# Patient Record
Sex: Female | Born: 1985 | Race: Black or African American | Hispanic: No | Marital: Married | Smoking: Never smoker
Health system: Southern US, Community
[De-identification: ages and names within clinical notes are randomized; demographics above are authoritative.]

## PROBLEM LIST (undated history)

## (undated) ENCOUNTER — Inpatient Hospital Stay (HOSPITAL_COMMUNITY): Payer: Self-pay

## (undated) DIAGNOSIS — R109 Unspecified abdominal pain: Secondary | ICD-10-CM

## (undated) DIAGNOSIS — O26899 Other specified pregnancy related conditions, unspecified trimester: Secondary | ICD-10-CM

## (undated) DIAGNOSIS — K589 Irritable bowel syndrome without diarrhea: Secondary | ICD-10-CM

## (undated) DIAGNOSIS — G248 Other dystonia: Secondary | ICD-10-CM

## (undated) DIAGNOSIS — R87629 Unspecified abnormal cytological findings in specimens from vagina: Secondary | ICD-10-CM

## (undated) DIAGNOSIS — M549 Dorsalgia, unspecified: Secondary | ICD-10-CM

## (undated) DIAGNOSIS — Z8619 Personal history of other infectious and parasitic diseases: Secondary | ICD-10-CM

## (undated) DIAGNOSIS — R51 Headache: Secondary | ICD-10-CM

## (undated) DIAGNOSIS — F419 Anxiety disorder, unspecified: Secondary | ICD-10-CM

## (undated) DIAGNOSIS — G249 Dystonia, unspecified: Secondary | ICD-10-CM

## (undated) DIAGNOSIS — K219 Gastro-esophageal reflux disease without esophagitis: Secondary | ICD-10-CM

## (undated) DIAGNOSIS — O24419 Gestational diabetes mellitus in pregnancy, unspecified control: Secondary | ICD-10-CM

## (undated) DIAGNOSIS — O26893 Other specified pregnancy related conditions, third trimester: Secondary | ICD-10-CM

## (undated) DIAGNOSIS — D649 Anemia, unspecified: Secondary | ICD-10-CM

## (undated) DIAGNOSIS — E669 Obesity, unspecified: Secondary | ICD-10-CM

## (undated) DIAGNOSIS — K602 Anal fissure, unspecified: Secondary | ICD-10-CM

## (undated) DIAGNOSIS — R519 Headache, unspecified: Secondary | ICD-10-CM

## (undated) DIAGNOSIS — N6452 Nipple discharge: Secondary | ICD-10-CM

## (undated) DIAGNOSIS — G8929 Other chronic pain: Secondary | ICD-10-CM

## (undated) HISTORY — DX: Anxiety disorder, unspecified: F41.9

## (undated) HISTORY — DX: Nipple discharge: N64.52

## (undated) HISTORY — PX: WISDOM TOOTH EXTRACTION: SHX21

## (undated) HISTORY — DX: Dystonia, unspecified: G24.9

## (undated) HISTORY — PX: TONSILLECTOMY: SUR1361

## (undated) HISTORY — DX: Gestational diabetes mellitus in pregnancy, unspecified control: O24.419

## (undated) HISTORY — PX: CHOLECYSTECTOMY, LAPAROSCOPIC: SHX56

## (undated) HISTORY — DX: Obesity, unspecified: E66.9

## (undated) HISTORY — DX: Other chronic pain: G89.29

## (undated) HISTORY — PX: CHOLECYSTECTOMY: SHX55

## (undated) HISTORY — DX: Personal history of other infectious and parasitic diseases: Z86.19

## (undated) HISTORY — DX: Anal fissure, unspecified: K60.2

## (undated) HISTORY — DX: Headache: R51

## (undated) HISTORY — DX: Dorsalgia, unspecified: M54.9

## (undated) HISTORY — DX: Other dystonia: G24.8

## (undated) HISTORY — DX: Anemia, unspecified: D64.9

## (undated) HISTORY — DX: Headache, unspecified: R51.9

## (undated) HISTORY — DX: Unspecified abnormal cytological findings in specimens from vagina: R87.629

## (undated) HISTORY — DX: Irritable bowel syndrome, unspecified: K58.9

---
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
## 2007-09-23 ENCOUNTER — Encounter: Admission: RE | Admit: 2007-09-23 | Discharge: 2007-09-23 | Payer: Self-pay | Admitting: Family Medicine

## 2009-03-17 ENCOUNTER — Emergency Department (HOSPITAL_COMMUNITY): Admission: EM | Admit: 2009-03-17 | Discharge: 2009-03-17 | Payer: Self-pay | Admitting: Emergency Medicine

## 2009-04-02 ENCOUNTER — Emergency Department (HOSPITAL_COMMUNITY): Admission: EM | Admit: 2009-04-02 | Discharge: 2009-04-02 | Payer: Self-pay | Admitting: Emergency Medicine

## 2009-04-11 ENCOUNTER — Ambulatory Visit (HOSPITAL_COMMUNITY): Admission: RE | Admit: 2009-04-11 | Discharge: 2009-04-11 | Payer: Self-pay | Admitting: Sports Medicine

## 2009-04-25 ENCOUNTER — Encounter (HOSPITAL_COMMUNITY): Admission: RE | Admit: 2009-04-25 | Discharge: 2009-05-25 | Payer: Self-pay | Admitting: Sports Medicine

## 2009-05-30 ENCOUNTER — Encounter (HOSPITAL_COMMUNITY): Admission: RE | Admit: 2009-05-30 | Discharge: 2009-06-13 | Payer: Self-pay | Admitting: Sports Medicine

## 2009-06-17 ENCOUNTER — Encounter (HOSPITAL_COMMUNITY): Admission: RE | Admit: 2009-06-17 | Discharge: 2009-07-17 | Payer: Self-pay | Admitting: Sports Medicine

## 2009-09-10 ENCOUNTER — Ambulatory Visit: Payer: Self-pay | Admitting: Psychology

## 2010-01-03 ENCOUNTER — Ambulatory Visit (HOSPITAL_COMMUNITY): Admission: RE | Admit: 2010-01-03 | Discharge: 2010-01-03 | Payer: Self-pay | Admitting: Family Medicine

## 2010-08-13 ENCOUNTER — Emergency Department (HOSPITAL_COMMUNITY)
Admission: EM | Admit: 2010-08-13 | Discharge: 2010-08-13 | Disposition: A | Discharge status: Home / Self Care | Payer: Managed Care, Other (non HMO) | Attending: Emergency Medicine | Admitting: Emergency Medicine

## 2010-08-13 DIAGNOSIS — K602 Anal fissure, unspecified: Secondary | ICD-10-CM | POA: Insufficient documentation

## 2010-08-13 DIAGNOSIS — K625 Hemorrhage of anus and rectum: Secondary | ICD-10-CM | POA: Insufficient documentation

## 2010-08-13 LAB — URINALYSIS, ROUTINE W REFLEX MICROSCOPIC
Ketones, ur: NEGATIVE mg/dL
Nitrite: NEGATIVE
Protein, ur: NEGATIVE mg/dL
Urine Glucose, Fasting: NEGATIVE mg/dL
pH: 5.5 (ref 5.0–8.0)

## 2010-08-13 LAB — POCT I-STAT, CHEM 8
Calcium, Ion: 1.21 mmol/L (ref 1.12–1.32)
Chloride: 104 mEq/L (ref 96–112)
Glucose, Bld: 94 mg/dL (ref 70–99)
Hemoglobin: 12.9 g/dL (ref 12.0–15.0)
Sodium: 140 mEq/L (ref 135–145)
TCO2: 24 mmol/L (ref 0–100)

## 2010-08-14 ENCOUNTER — Telehealth: Payer: Self-pay | Admitting: Gastroenterology

## 2010-08-15 ENCOUNTER — Encounter: Payer: Self-pay | Admitting: Nurse Practitioner

## 2010-08-15 ENCOUNTER — Ambulatory Visit (INDEPENDENT_AMBULATORY_CARE_PROVIDER_SITE_OTHER): Payer: Managed Care, Other (non HMO) | Admitting: Nurse Practitioner

## 2010-08-15 DIAGNOSIS — R143 Flatulence: Secondary | ICD-10-CM

## 2010-08-15 DIAGNOSIS — R1084 Generalized abdominal pain: Secondary | ICD-10-CM | POA: Insufficient documentation

## 2010-08-15 DIAGNOSIS — K625 Hemorrhage of anus and rectum: Secondary | ICD-10-CM | POA: Insufficient documentation

## 2010-08-15 DIAGNOSIS — K648 Other hemorrhoids: Secondary | ICD-10-CM | POA: Insufficient documentation

## 2010-08-15 DIAGNOSIS — R141 Gas pain: Secondary | ICD-10-CM | POA: Insufficient documentation

## 2010-08-15 DIAGNOSIS — R142 Eructation: Secondary | ICD-10-CM

## 2010-08-21 NOTE — Progress Notes (Signed)
Summary: Triage  Phone Note Call from Patient Call back at Home Phone [phone]   Caller: Patient Call For: Dr Russella Dar Reason for Call: Acute Illness, Talk to Nurse Details for Reason: Triage Summary of Call: Pt went to [facility] ER yesterday c/o of abd cramping, nausea (without vomiting) and blood in stool. They referred her to Dr Russella Dar. Pt does not feel she can wait until April to be seen. Initial call taken by: Dwan Bolt,  August 14, 2010 12:09 PM  Follow-up for Phone Call        patient is scheduled to see Willette Cluster RNP on 08/15/10 2:30 Follow-up by: Darcey Nora RN, CGRN,  August 14, 2010 4:21 PM
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
## 2010-09-26 ENCOUNTER — Ambulatory Visit: Payer: Managed Care, Other (non HMO) | Admitting: Internal Medicine

## 2010-10-31 NOTE — H&P (Signed)
[facility]  Patient:    Yvette Conley, Yvette Conley Visit Number: [id number] MRN: [id number]          Service Type: MED Location: 3A A315 01 Attending Physician:  Rosalyn Charters Dictated by:   Vivia Ewing, D.O. Admit Date:  09/18/2001   CC:         Donna Bernard, M.D.   History and Physical  CHIEF COMPLAINT:  Leg pain.  HISTORY OF PRESENT ILLNESS:  The patient is a 25 year old female who presents with approximately a five-day history of progressive and persistent ankle pain.  She has had no apparent swelling but has complained significantly of discomfort, especially on ambulation.  She was seen by Dr. Lacretia Nicks. Simone Curia previously in the week and had some blood drawn and placed on prednisone.  She has had no improvement and arrived to the emergency room on the evening of admission.  I was consulted on two occasions by telephone to assist in managing this patient.  We attempted to increase her analgesics to narcotics and she has had no improvement and requires admission to the hospital for pain management.  PAST MEDICAL HISTORY:  Noncontributory.  SOCIAL HISTORY:  Noncontributory.  FAMILY HISTORY:  Negative for sickle cell trait or disease.  MEDICATIONS:  Prednisone, as prescribed by Dr. Gerda Diss.  The patient has received Demerol and Phenergan in the emergency room without improvement.  ALLERGIES:  No known drug allergies.  REVIEW OF SYSTEMS:  There have been no other systemic symptoms of fever or other joint pains other than on the evening that she was in the emergency room.  She now complains of pains all over.  There is no nausea or vomiting, URI or cough.  PHYSICAL EXAMINATION:  VITAL SIGNS:  Vital signs are all stable.  The patient is afebrile.  EXTREMITIES:  The leg shows no edema or joint effusion noted.  There is tenderness to palpation over both the joint and soft tissue areas of the leg.  Otherwise, physical exam is  unremarkable.  LABORATORY AND ACCESSORY DATA:  X-rays are unremarkable for any bony abnormality or obvious soft tissue abnormality.  IMPRESSION AND PLAN:  25-year-old female with arthralgias/myalgias. Plan will be to admit for analgesia.  I have requested that blood be obtained for an ANA, rheumatoid factor as well as ASO titer.  We will try to maintain her both on nonsteroidals, continue prednisone as well as parenteral narcotic pain medications as needed. Dictated by:   Vivia Ewing, D.O. Attending Physician:  Rosalyn Charters DD:  09/19/01 TD:  09/19/01 Job: [id number]

## 2010-10-31 NOTE — Discharge Summary (Signed)
[facility]  Patient:    Conley, Yvette Visit Number: [id number] MRN: [id number]          Service Type: MED Location: 3A A315 01 Attending Physician:  Harlow Asa Dictated by:   Donna Bernard, M.D. Admit Date:  09/18/2001 Discharge Date: 09/20/2001                             Discharge Summary  FINAL DIAGNOSES: 1. Leg pain. 2. Shoulder pain. 3. Hyperventilation.  FINAL DISPOSITION:  Patient discharged to home.  DISCHARGE MEDICATIONS: 1. Prednisone taper as directed. 2. Vicodin one q.4-6h. p.r.n. for pain. 3. Xanax 0.5 mg one p.r.n. for nerves/hyperventilation.  ACTIVITY:  Patient to utilize bed rest and increase activity slowly and crutches if necessary.  FOLLOWUP:  Will set up with a rheumatologist at [facility] to follow up patient as an outpatient.  INITIAL HISTORY AND PHYSICAL:  Please see H&P as dictated.  HOSPITAL COURSE:  This patient is a 25 year old black female who presents to hospital date of admission with complaints of progressive pain in the leg. She was in significant distress and tearful with her discomfort.  She had been seen earlier in the week by Dr. Lilyan Punt and given some prednisone and some blood work was ordered.  Dr. Milford Cage worked with the mother multiple times over the phone and adjusted pain medications with no apparent improvement in the patients symptoms.  She was admitted to the hospital.  Blood work was done.  White blood count was normal.  Hemoglobin normal.  Sedimentation rate 27, virtually normal.  Sickle cell screen negative.  MET-7 negative.  Uric acid normal 4.7.  ANA, rheumatoid factor were sent by Dr. Milford Cage.  ASO test was done.  This was negative.  Rheumatoid factor returned negative.  X-ray of the left leg was done.  This was negative.  Chest x-ray done showed no active disease.  EKG was done which showed normal sinus rhythm, no significant ST-T changes.  The patient was given IV pain  medicine by Dr. Milford Cage for control of pain.  She was also started on prednisone 25 mg t.i.d.  Over the next 36 hours the patient noted some improvement in her level of pain.  There was a spell of hyperventilation and perceived shortness of breath which led the patient and the family to become quite anxious and upset.  A blood gas performed during her period of shortness of breath revealed signs felt to be consistent with hyperventilation.  Based on this the patient was given some Xanax to help with her symptomatology.  Of note, an EKG and chest x-ray were performed which were normal.  The day of discharge the patient was sleeping comfortably in the room when I came in to wake her up.  She became tearful when I started to talk to her about her pain.  I indicated to the mother that all the childs tests were virtually normal and that at this time I saw no reason to maintain the patient in the hospital.  I advised the family that I thought she would do better in the comfort of her own home with appropriate pain medicine and follow-up visit with a rheumatology specialist at the [facility].  The family expressed agreement with this.  I went over to the office.  I asked our nurse to immediately start working on a referral.  About an hour later the hospital nurse called back  and stated the family was upset with having to leave.  I advised the nurse at the hospital that I really thought the patients tests were normal and her examination was stable and I saw no reason for continued admission to the hospital.  I felt the patient needed to see a rheumatologist which I felt could be safely done on an outpatient basis.  I spoke with the rheumatologist at [facility] and presented the entire case to the rheumatologist.  The rheumatologist agreed that this could be handled in an urgent fashion, but through an outpatient setting.  She was set up to see an outpatient rheumatologist on Thursday.   Patient was diagnosed home on discharged and disposition as noted above. Dictated by:   Donna Bernard, M.D. Attending Physician:  Harlow Asa DD:  10/04/01 TD:  10/05/01 Job: [id number]

## 2011-02-02 ENCOUNTER — Ambulatory Visit: Payer: Managed Care, Other (non HMO) | Admitting: Family Medicine

## 2011-02-06 ENCOUNTER — Ambulatory Visit (INDEPENDENT_AMBULATORY_CARE_PROVIDER_SITE_OTHER): Payer: Managed Care, Other (non HMO) | Admitting: Family Medicine

## 2011-02-06 ENCOUNTER — Encounter: Payer: Self-pay | Admitting: Family Medicine

## 2011-02-06 VITALS — BP 110/80 | HR 100 | Ht 67.0 in | Wt 243.0 lb

## 2011-02-06 DIAGNOSIS — R259 Unspecified abnormal involuntary movements: Secondary | ICD-10-CM

## 2011-02-06 DIAGNOSIS — G249 Dystonia, unspecified: Secondary | ICD-10-CM

## 2011-02-06 DIAGNOSIS — E669 Obesity, unspecified: Secondary | ICD-10-CM

## 2011-02-06 DIAGNOSIS — G248 Other dystonia: Secondary | ICD-10-CM

## 2011-02-06 MED ORDER — BACLOFEN 10 MG PO TABS: 10.0000 mg | ORAL_TABLET | Freq: Three times a day (TID) | ORAL | Status: AC | PRN

## 2011-02-06 NOTE — Patient Instructions (Addendum)
I will get records from Dr. Adaline Sill office Flu shot given today  Use the baclofen if you have another attack Continue other medications Check into [employer] about swimming/pool times , watch the carbs, and fried foods , increase veggies and water intake Schedule a PAP Smear visit

## 2011-02-08 DIAGNOSIS — E669 Obesity, unspecified: Secondary | ICD-10-CM | POA: Insufficient documentation

## 2011-02-08 DIAGNOSIS — G249 Dystonia, unspecified: Secondary | ICD-10-CM | POA: Insufficient documentation

## 2011-02-08 DIAGNOSIS — G248 Other dystonia: Secondary | ICD-10-CM | POA: Insufficient documentation

## 2011-02-08 NOTE — Assessment & Plan Note (Signed)
I reviewed dystonia, medications used include anti-parkinson meds, anti-spasmodic, anxiolytics and a few others which pt has tried. At this time we will continue her prn Ativan, given a script for Baclofen to try. Will obtain info from her PCP

## 2011-02-08 NOTE — Assessment & Plan Note (Signed)
Pt at high risk for DM, HTN, heart disease based on weight and family history. This was expressed, we discussed needing to start an exercise routine, change diet.  Will review PCP labs She needs FLP done
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
## 2011-03-06 ENCOUNTER — Other Ambulatory Visit (HOSPITAL_COMMUNITY)
Admission: RE | Admit: 2011-03-06 | Discharge: 2011-03-06 | Disposition: A | Discharge status: Home / Self Care | Payer: Managed Care, Other (non HMO) | Source: Ambulatory Visit | Attending: Family Medicine | Admitting: Family Medicine

## 2011-03-06 ENCOUNTER — Encounter: Payer: Self-pay | Admitting: Family Medicine

## 2011-03-06 ENCOUNTER — Ambulatory Visit (INDEPENDENT_AMBULATORY_CARE_PROVIDER_SITE_OTHER): Payer: Managed Care, Other (non HMO) | Admitting: Family Medicine

## 2011-03-06 VITALS — BP 120/78 | HR 89 | Resp 16 | Ht 67.0 in | Wt 237.1 lb

## 2011-03-06 DIAGNOSIS — N760 Acute vaginitis: Secondary | ICD-10-CM

## 2011-03-06 DIAGNOSIS — G248 Other dystonia: Secondary | ICD-10-CM

## 2011-03-06 DIAGNOSIS — Z01419 Encounter for gynecological examination (general) (routine) without abnormal findings: Secondary | ICD-10-CM | POA: Insufficient documentation

## 2011-03-06 DIAGNOSIS — Z0000 Encounter for general adult medical examination without abnormal findings: Secondary | ICD-10-CM

## 2011-03-06 DIAGNOSIS — G249 Dystonia, unspecified: Secondary | ICD-10-CM

## 2011-03-06 DIAGNOSIS — N926 Irregular menstruation, unspecified: Secondary | ICD-10-CM

## 2011-03-06 DIAGNOSIS — R0789 Other chest pain: Secondary | ICD-10-CM

## 2011-03-06 DIAGNOSIS — Z124 Encounter for screening for malignant neoplasm of cervix: Secondary | ICD-10-CM

## 2011-03-06 NOTE — Progress Notes (Signed)
  Subjective:    Patient ID: Yvette Conley, female    DOB: 03-16-1986, 25 y.o.   MRN: [id number]  HPI Pt here for annual exam, PAP Smear  PAP smear- no abnormal PAP smear, has history of irregular menses since they started as a teen, currently married, would like STD check today.  LMP- 9/13, lasted 1 week , typical is 3-4 days   Chest pain- has this occasionally when she drives, it only last a few seconds, non radiating, no SOB, no N/V, no diaphoresis, this has not happened recenlty, it does not coincide with her dystonic reactions, wanted to know signs of heart attack.  Obesity- trying to loose weight, has started a healthy diet and exercise with her husband  Dystonia- had 1 episode, baclofen did not help very much, she thought it may have made her arm flailing worse, she does not want any new meds or referrals at this time.  Labs not back from previous PCP Review of Systems  GEN- denies fatigue, fever, unintentional weight loss,weakness, recent illness CVS- denies recent chest pain, palpitations RESP- denies SOB, cough, wheeze ABD- denies N/V, change in stools, abd pain GU- denies dysuria, hematuria, dribbling, incontinence, denies vag discharge, bleeding Neuro- denies headache, dizziness, syncope, seizure activity       Objective:   Physical Exam GEN- NAD, alert and oriented, obese Neck- supple, no thyromegaly CVS- RRR, no murmur Breast- normal symmetry, no nipple inversion,no nipple drainage, no nodules or lumps felt Nodes- no axillary nodes GU- normal external genitalia, vaginal mucosa pink and moist, cervix visualized no growth, no blood form os, minimal thin clear discharge, no CMT, no ovarian masses, uterus normal size Ext- no edema       Assessment & Plan:   CPE- PAP smear done, flu shot given at last visit, STD check performed at pt request See instructions  Irregular menses- pt declines OCP trial at this time, when she is ready to start a family would advise  GYN/OB consultation with her medical history  Chest pain- pt has not had any recent chest pain- normal exam, no risk factors for CAD with exeption of obesity. Check labs from previous PCP, no work-up at this time, given reassurance  Dystonic movements- pt declines any further work-up at this time, would send to a [facility] such as [facility] or [facility], she has been to [facility]

## 2011-03-06 NOTE — Patient Instructions (Signed)
We will call with results of your PAP Smear I will call if you need further labs If your episodes become more of a problem let me know and I will send you to [facility] or [facility] If your menses get very heavy or last for prolonged periods of time please call. Continue your vitamin I recommend an eye visit yearly I recommend dental visit twice a  Year Continue to work on weight loss You should have a yearly exam

## 2011-03-07 LAB — WET PREP BY MOLECULAR PROBE
Candida species: NEGATIVE
Gardnerella vaginalis: NEGATIVE

## 2011-03-08 ENCOUNTER — Encounter: Payer: Self-pay | Admitting: Family Medicine

## 2011-06-16 DIAGNOSIS — N6452 Nipple discharge: Secondary | ICD-10-CM

## 2011-06-16 HISTORY — DX: Nipple discharge: N64.52

## 2011-06-25 ENCOUNTER — Encounter: Payer: Self-pay | Admitting: Family Medicine

## 2011-06-25 ENCOUNTER — Ambulatory Visit (INDEPENDENT_AMBULATORY_CARE_PROVIDER_SITE_OTHER): Payer: Managed Care, Other (non HMO) | Admitting: Family Medicine

## 2011-06-25 VITALS — BP 118/80 | HR 87 | Resp 16 | Ht 67.0 in | Wt 239.0 lb

## 2011-06-25 DIAGNOSIS — R350 Frequency of micturition: Secondary | ICD-10-CM

## 2011-06-25 DIAGNOSIS — G248 Other dystonia: Secondary | ICD-10-CM

## 2011-06-25 DIAGNOSIS — E669 Obesity, unspecified: Secondary | ICD-10-CM

## 2011-06-25 DIAGNOSIS — G249 Dystonia, unspecified: Secondary | ICD-10-CM

## 2011-06-25 DIAGNOSIS — R3589 Other polyuria: Secondary | ICD-10-CM | POA: Insufficient documentation

## 2011-06-25 DIAGNOSIS — N926 Irregular menstruation, unspecified: Secondary | ICD-10-CM | POA: Insufficient documentation

## 2011-06-25 DIAGNOSIS — R259 Unspecified abnormal involuntary movements: Secondary | ICD-10-CM

## 2011-06-25 LAB — BASIC METABOLIC PANEL
CO2: 27 mEq/L (ref 19–32)
Chloride: 103 mEq/L (ref 96–112)
Creat: 0.66 mg/dL (ref 0.50–1.10)
Potassium: 4.8 mEq/L (ref 3.5–5.3)
Sodium: 140 mEq/L (ref 135–145)

## 2011-06-25 LAB — LUTEINIZING HORMONE: LH: 13.1 m[IU]/mL

## 2011-06-25 LAB — LIPID PANEL
Total CHOL/HDL Ratio: 2.4 Ratio
Triglycerides: 94 mg/dL (ref <150)

## 2011-06-25 LAB — HEMOGLOBIN A1C: Mean Plasma Glucose: 117 mg/dL — ABNORMAL HIGH (ref <117)

## 2011-06-25 LAB — CBC
Hemoglobin: 12.2 g/dL (ref 12.0–15.0)
MCH: 29.8 pg (ref 26.0–34.0)
MCV: 93.2 fL (ref 78.0–100.0)

## 2011-06-25 LAB — POCT URINE PREGNANCY: Preg Test, Ur: NEGATIVE

## 2011-06-25 NOTE — Assessment & Plan Note (Addendum)
Patient has decided that she would like to conceive in the near future. I will start with baseline labs regarding her irregular menses. She will be referred to GYN for a complete workup. At this time we will not start birth control as a trial.  urine pregnancy negative today

## 2011-06-25 NOTE — Progress Notes (Signed)
  Subjective:    Patient ID: Yvette Conley, female    DOB: February 27, 1986, 26 y.o.   MRN: [id number]  HPI Dystonia - Oct 2010- had to withdraw secondary to dystonic episodes, which initially started in Sept 2010, seen by [facility] and Previous PCP at that time. Given a letter from [facility] on Oct 2010 for withdrawel from school  Secondary to inability to walk, bend or write Restarted  School in Jan 2011- spring semester  - she continued to have episodes, some classes had to be  repeated secondary to poor performance. Needs a letter   Pt was undergoing many visits to [facility] , [facility] and medication changes   Irregular Menses- LMP Oct 21-Nov 8, heavy for first week then tapered off, +cramping and low back pain at time. History of irregular cycles, often has months without cycle, this is the longest cycle she has had. Would like to start conceiving in the next 6 months. Has not been evaluated by GYN  Polyuria- has been up to the restroom many times over the past few months, some days feels like every 5 minutes, concerned she is at risk for diabetes. Denies dysuria, hematuria, incontinence. Denies polydipsia.  Review of Systems   GEN- denies fatigue, fever, weight loss,weakness, recent illness ABD- denies N/V, change in stools, abd pain GU- denies dysuria, hematuria, dribbling, incontinence MSK- denies joint pain, muscle aches, injury Neuro- denies headache, dizziness, syncope, seizure activity       Objective:   Physical Exam GEN- NAD, alert and oriented, pleasant Neuro- no abnormal movements or tics Psych- no depressed or overly anxious appearing        Assessment & Plan:

## 2011-06-25 NOTE — Patient Instructions (Signed)
You can pick up your letter on Monday, call first For your labs please have them drawn we will call with results I will make a referral for OB/GYN

## 2011-06-25 NOTE — Assessment & Plan Note (Signed)
Patient asked for a letter to be written to her school indicating when she withdrew secondary to her workup for her paroxysmal dystonia. She did give me verification of being taken out of work from her previous doctor who she no longer sees. I will write a letter for her school hopefully she will have her tuition adjusted

## 2011-06-25 NOTE — Assessment & Plan Note (Signed)
Encourage weight loss and exercise program. Check FLP

## 2011-06-25 NOTE — Assessment & Plan Note (Signed)
She admits to some frequency of urination however no signs of infection. She does have a family history of diabetes. I will obtain baseline labs as she has not had this done. She is very young however is obese and has risk factors from the family. She denies any incontinence.

## 2011-06-28 ENCOUNTER — Encounter: Payer: Self-pay | Admitting: Family Medicine

## 2011-06-29 ENCOUNTER — Other Ambulatory Visit: Payer: Self-pay | Admitting: Family Medicine

## 2011-06-29 DIAGNOSIS — N926 Irregular menstruation, unspecified: Secondary | ICD-10-CM

## 2011-06-30 ENCOUNTER — Telehealth: Payer: Self-pay | Admitting: Family Medicine

## 2011-06-30 NOTE — Telephone Encounter (Signed)
Called and left message for pt. That dr. Cherly Hensen office would call her with appt and time

## 2011-06-30 NOTE — Progress Notes (Signed)
Pt was referred to dr. Cherly Hensen office. They will contact her with appt and time.

## 2011-07-02 ENCOUNTER — Ambulatory Visit: Payer: Managed Care, Other (non HMO) | Admitting: Family Medicine

## 2011-07-09 ENCOUNTER — Ambulatory Visit: Payer: Managed Care, Other (non HMO) | Admitting: Family Medicine

## 2011-08-25 ENCOUNTER — Encounter: Payer: Self-pay | Admitting: Family Medicine

## 2011-08-25 ENCOUNTER — Ambulatory Visit (INDEPENDENT_AMBULATORY_CARE_PROVIDER_SITE_OTHER): Payer: Managed Care, Other (non HMO) | Admitting: Family Medicine

## 2011-08-25 VITALS — BP 122/76 | HR 87 | Resp 18 | Ht 67.0 in | Wt 240.0 lb

## 2011-08-25 DIAGNOSIS — N926 Irregular menstruation, unspecified: Secondary | ICD-10-CM

## 2011-08-25 DIAGNOSIS — N6459 Other signs and symptoms in breast: Secondary | ICD-10-CM

## 2011-08-25 DIAGNOSIS — N6452 Nipple discharge: Secondary | ICD-10-CM

## 2011-08-25 LAB — POCT URINE PREGNANCY: Preg Test, Ur: NEGATIVE

## 2011-08-25 LAB — POC HEMOCCULT BLD/STL (OFFICE/1-CARD/DIAGNOSTIC): Fecal Occult Blood, POC: POSITIVE

## 2011-08-25 NOTE — Assessment & Plan Note (Addendum)
Ultrasound and diagnostic mammogram to be obtained Based on that will refer to general surgery and her GYN will be notified

## 2011-08-25 NOTE — Patient Instructions (Signed)
Continue to f/u with Dr. Cherly Hensen, I will get the records We will set you up for an ultrasound and Mammogram to look at the bleeding I recommend you start a prenatal vitamin daily  I will call with results from the testing and further instructions

## 2011-08-25 NOTE — Progress Notes (Signed)
  Subjective:    Patient ID: Yvette Conley, female    DOB: 1986/06/15, 26 y.o.   MRN: [id number]  HPI  Pt here secondary to bloody discharge from right nipple. She's had this before and 2010 where she had workup which revealed abnormal mass but was told it was benign. She occasionally has clear discharge from the left breast  She is currently trying to conceive. She is followed by GYN secondary to irregular menses Last menstrual period February 1 of February 15  Review of Systems - per above   GEN- No fever, no recent illness     Objective:   Physical Exam GEN- NAD, alert and oriented, Neck- no thyromegaly Breast- normal symmetry, no nipple inversion,+ bloody nipple drainage from right breast, no nodules or lumps felt Hemmocult from right breast- positive Nodes- no axillary nodes     Assessment & Plan:

## 2011-08-26 NOTE — Assessment & Plan Note (Signed)
Upreg negative today Advised pt to start PNV F/U with GYN

## 2011-08-31 ENCOUNTER — Telehealth: Payer: Self-pay | Admitting: Family Medicine

## 2011-09-02 ENCOUNTER — Ambulatory Visit (HOSPITAL_COMMUNITY): Payer: Managed Care, Other (non HMO)

## 2011-09-02 ENCOUNTER — Ambulatory Visit (HOSPITAL_COMMUNITY)
Admission: RE | Admit: 2011-09-02 | Discharge: 2011-09-02 | Disposition: A | Discharge status: Home / Self Care | Payer: Managed Care, Other (non HMO) | Source: Ambulatory Visit | Attending: Family Medicine | Admitting: Family Medicine

## 2011-09-02 DIAGNOSIS — N6459 Other signs and symptoms in breast: Secondary | ICD-10-CM | POA: Insufficient documentation

## 2011-09-02 DIAGNOSIS — N6452 Nipple discharge: Secondary | ICD-10-CM

## 2011-09-04 NOTE — Telephone Encounter (Signed)
Patient changed mind on the call

## 2011-10-06 ENCOUNTER — Encounter: Payer: Self-pay | Admitting: Family Medicine

## 2011-10-06 ENCOUNTER — Ambulatory Visit (INDEPENDENT_AMBULATORY_CARE_PROVIDER_SITE_OTHER): Payer: BC Managed Care – PPO | Admitting: Family Medicine

## 2011-10-06 VITALS — BP 126/74 | HR 97 | Resp 18 | Ht 67.0 in | Wt 240.1 lb

## 2011-10-06 DIAGNOSIS — E669 Obesity, unspecified: Secondary | ICD-10-CM

## 2011-10-06 DIAGNOSIS — Z0000 Encounter for general adult medical examination without abnormal findings: Secondary | ICD-10-CM | POA: Insufficient documentation

## 2011-10-06 DIAGNOSIS — IMO0001 Reserved for inherently not codable concepts without codable children: Secondary | ICD-10-CM

## 2011-10-06 DIAGNOSIS — Z23 Encounter for immunization: Secondary | ICD-10-CM

## 2011-10-06 DIAGNOSIS — Z9189 Other specified personal risk factors, not elsewhere classified: Secondary | ICD-10-CM

## 2011-10-06 MED ORDER — DICLOFENAC SODIUM 75 MG PO TBEC: 75.0000 mg | DELAYED_RELEASE_TABLET | Freq: Two times a day (BID) | ORAL | Status: DC

## 2011-10-06 NOTE — Patient Instructions (Addendum)
I recommend 1800 calorie diet Add fresh fruits and veggies with each meal Pick up form Friday Come back Friday to have TB test read

## 2011-10-06 NOTE — Progress Notes (Signed)
Addended by: Kandis Fantasia B on: 10/06/2011 05:18 PM   Modules accepted: Orders

## 2011-10-06 NOTE — Assessment & Plan Note (Signed)
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 PPD placed TDAP given MMR,HepB, varicella titers to be done as records can not be obtained in a timely manner

## 2011-10-06 NOTE — Assessment & Plan Note (Signed)
1800 calorie diet, continue swimming, exercise difficult because it causes her dystonic episodes

## 2011-10-06 NOTE — Progress Notes (Signed)
  Subjective:    Patient ID: Yvette Conley, female    DOB: [birth year], 26 y.o.   MRN: [id number]  HPI  Patient here for her physical for work. She needs immunizations as well as form completed. Also needs TB test placed. She's also been concerned about her weight gain. She's been working out by swimming because of her dystonia. She's been trying to watch her portions but feels she needs a little more guidance.  Review of Systems   GEN- denies fatigue, fever, weight loss,weakness, recent illness HEENT- denies eye drainage, change in vision, nasal discharge, CVS- denies chest pain, palpitations RESP- denies SOB, cough, wheeze ABD- denies N/V, change in stools, abd pain GU- denies dysuria, hematuria, dribbling, incontinence MSK- + joint pain, muscle aches, injury Neuro- denies headache, dizziness, syncope, seizure activity      Objective:   Physical Exam GEN- NAD, alert and oriented x3 HEENT- PERRL, EOMI, non injected sclera, pink conjunctiva, MMM, oropharynx clear CVS- RRR, no murmur RESP-CTAB ABD-NABS,soft, NT,ND Neuro- no focal deficits, motor in tact  EXT- No edema Pulses- Radial, DP- 2+        Assessment & Plan:

## 2011-10-07 LAB — VARICELLA ZOSTER ANTIBODY, IGG: Varicella IgG: 3.04 {ISR} — ABNORMAL HIGH

## 2011-10-07 LAB — HEPATITIS B SURFACE ANTIGEN: Hepatitis B Surface Ag: NEGATIVE

## 2011-10-08 LAB — TB SKIN TEST
Induration: 0
TB Skin Test: NEGATIVE mm

## 2011-11-02 ENCOUNTER — Encounter: Payer: Self-pay | Admitting: Family Medicine

## 2011-11-02 ENCOUNTER — Ambulatory Visit (INDEPENDENT_AMBULATORY_CARE_PROVIDER_SITE_OTHER): Payer: BC Managed Care – PPO | Admitting: Family Medicine

## 2011-11-02 ENCOUNTER — Telehealth: Payer: Self-pay | Admitting: Family Medicine

## 2011-11-02 VITALS — BP 110/74 | HR 98 | Resp 18 | Ht 67.0 in | Wt 236.1 lb

## 2011-11-02 DIAGNOSIS — R109 Unspecified abdominal pain: Secondary | ICD-10-CM

## 2011-11-02 DIAGNOSIS — K921 Melena: Secondary | ICD-10-CM

## 2011-11-02 DIAGNOSIS — K648 Other hemorrhoids: Secondary | ICD-10-CM

## 2011-11-02 DIAGNOSIS — R197 Diarrhea, unspecified: Secondary | ICD-10-CM

## 2011-11-02 MED ORDER — DIPHENOXYLATE-ATROPINE 2.5-0.025 MG PO TABS: 1.0000 | ORAL_TABLET | Freq: Four times a day (QID) | ORAL | Status: DC | PRN

## 2011-11-02 NOTE — Patient Instructions (Addendum)
For your diarrhea try the lomotil  I will set you up for a CT scan for your stomach  Bring in the stool  Get the blood drawn  Lot of fluids,gaterade

## 2011-11-03 ENCOUNTER — Encounter: Payer: Self-pay | Admitting: Family Medicine

## 2011-11-03 ENCOUNTER — Ambulatory Visit: Payer: BC Managed Care – PPO | Admitting: Family Medicine

## 2011-11-03 LAB — BASIC METABOLIC PANEL
Calcium: 9.7 mg/dL (ref 8.4–10.5)
Chloride: 103 mEq/L (ref 96–112)
Glucose, Bld: 70 mg/dL (ref 70–99)
Potassium: 4.2 mEq/L (ref 3.5–5.3)
Sodium: 142 mEq/L (ref 135–145)

## 2011-11-03 LAB — CBC WITH DIFFERENTIAL/PLATELET
Eosinophils Relative: 1 % (ref 0–5)
HCT: 37.9 % (ref 36.0–46.0)
Lymphocytes Relative: 29 % (ref 12–46)
MCH: 30.4 pg (ref 26.0–34.0)
Platelets: 388 10*3/uL (ref 150–400)
RDW: 13.3 % (ref 11.5–15.5)
WBC: 7 10*3/uL (ref 4.0–10.5)

## 2011-11-03 NOTE — Assessment & Plan Note (Signed)
Likely cause of bleeding with diarrhea, no blood noted for past 2 days

## 2011-11-03 NOTE — Assessment & Plan Note (Signed)
Stool studies to be done, no recent antibiotics to suggest C diff and pt non toxic appearing, lomotil to be given

## 2011-11-03 NOTE — Progress Notes (Signed)
  Subjective:    Patient ID: Yvette Conley, female    DOB: 10/22/1985, 26 y.o.   MRN: [id number]  HPI Pt presents with abdominal pain, diarrhea and blood in stool for the past week. She has noted bright red blood at times and other dark stools like coffee grains, +nausea no emesis.Previous history of hemorrhoids. She remembers eating out before the episode started and felt a little sick then.Upwards of 5-6 watery stools a day, no recent antibiotics  Review of Systems - per above   GEN- denies fatigue, fever, weight loss,weakness, recent illness HEENT- denies eye drainage, change in vision, nasal discharge, CVS- denies chest pain, palpitations RESP- denies SOB, cough, wheeze ABD- + N/V, +change in stools,+ abd pain GU- denies dysuria, hematuria, dribbling, incontinence MSK- denies joint pain, muscle aches, injury Neuro- denies headache, dizziness, syncope, seizure activity      Objective:   Physical Exam GEN- NAD, alert and oriented x3 HEENT- PERRL, EOMI, non injected sclera, pink conjunctiva, MMM, oropharynx clear CVS- RRR, no murmur RESP-CTAB ABD- NABS,soft, TTP diffusely lower quadrants > than upper, no rebound, no gaurding, no masses felt, normal tympany no HSM EXT- No edema Pulses- Radial, DP- 2+        Assessment & Plan:

## 2011-11-03 NOTE — Assessment & Plan Note (Addendum)
Obtain CT abd r/u colitis, with blood noted, though no recent episode, family history of chron's diease. Doubt food poisoning would last this long. Check labs

## 2011-11-04 ENCOUNTER — Telehealth: Payer: Self-pay | Admitting: Family Medicine

## 2011-11-04 NOTE — Telephone Encounter (Signed)
Patient is aware 

## 2011-11-05 ENCOUNTER — Ambulatory Visit (HOSPITAL_COMMUNITY)
Admission: RE | Admit: 2011-11-05 | Discharge: 2011-11-05 | Disposition: A | Discharge status: Home / Self Care | Payer: BC Managed Care – PPO | Source: Ambulatory Visit | Attending: Family Medicine | Admitting: Family Medicine

## 2011-11-05 DIAGNOSIS — R197 Diarrhea, unspecified: Secondary | ICD-10-CM

## 2011-11-05 DIAGNOSIS — R599 Enlarged lymph nodes, unspecified: Secondary | ICD-10-CM | POA: Insufficient documentation

## 2011-11-05 DIAGNOSIS — R1032 Left lower quadrant pain: Secondary | ICD-10-CM | POA: Insufficient documentation

## 2011-11-05 DIAGNOSIS — K921 Melena: Secondary | ICD-10-CM

## 2011-11-05 DIAGNOSIS — R109 Unspecified abdominal pain: Secondary | ICD-10-CM

## 2011-11-08 LAB — STOOL CULTURE

## 2011-12-21 ENCOUNTER — Telehealth: Payer: Self-pay | Admitting: Family Medicine

## 2011-12-24 NOTE — Telephone Encounter (Signed)
Coming to collect  

## 2012-04-21 ENCOUNTER — Telehealth: Payer: Self-pay | Admitting: Family Medicine

## 2012-04-21 MED ORDER — GUAIFENESIN-CODEINE 100-10 MG/5ML PO SYRP: 5.0000 mL | ORAL_SOLUTION | Freq: Three times a day (TID) | ORAL | Status: DC | PRN

## 2012-04-21 NOTE — Telephone Encounter (Signed)
I recommend Mucinex for chest congestion, she can also try nasal saline If she has nose congestion- Sudafed for 3 days  Push fluids  Cough medicine sent

## 2012-04-22 NOTE — Telephone Encounter (Signed)
Patient aware.

## 2012-06-15 NOTE — L&D Delivery Note (Signed)
Delivery Note At 1:40 AM a viable and healthy female was delivered via Vaginal, Spontaneous Delivery (Presentation: Right Occiput Anterior).  APGAR: 8, 9; weight  7lb 3 oz.   Placenta status: Intact, Spontaneous.  Cord: 3 vessels with the following complications: None Short.  Cord pH: none  Anesthesia: Epidural Local  Episiotomy: None Lacerations: 2nd degree;Perineal Suture Repair: 3.0 chromic Est. Blood Loss (mL): 300  Mom to postpartum.  Baby to Couplet care / Skin to Skin.  Mitsuru Dault A 05/15/2013, 2:52 AM

## 2012-08-29 ENCOUNTER — Ambulatory Visit: Payer: BC Managed Care – PPO | Admitting: Family Medicine

## 2012-09-05 ENCOUNTER — Encounter: Payer: Self-pay | Admitting: Family Medicine

## 2012-09-05 ENCOUNTER — Ambulatory Visit (INDEPENDENT_AMBULATORY_CARE_PROVIDER_SITE_OTHER): Payer: BC Managed Care – PPO | Admitting: Family Medicine

## 2012-09-05 VITALS — BP 98/68 | HR 100 | Resp 18 | Ht 67.0 in | Wt 240.0 lb

## 2012-09-05 DIAGNOSIS — R259 Unspecified abnormal involuntary movements: Secondary | ICD-10-CM

## 2012-09-05 DIAGNOSIS — E669 Obesity, unspecified: Secondary | ICD-10-CM

## 2012-09-05 DIAGNOSIS — M2540 Effusion, unspecified joint: Secondary | ICD-10-CM

## 2012-09-05 DIAGNOSIS — Z3200 Encounter for pregnancy test, result unknown: Secondary | ICD-10-CM

## 2012-09-05 DIAGNOSIS — G248 Other dystonia: Secondary | ICD-10-CM

## 2012-09-05 DIAGNOSIS — Z3201 Encounter for pregnancy test, result positive: Secondary | ICD-10-CM

## 2012-09-05 DIAGNOSIS — G249 Dystonia, unspecified: Secondary | ICD-10-CM

## 2012-09-05 LAB — POCT URINE PREGNANCY: Preg Test, Ur: POSITIVE

## 2012-09-05 MED ORDER — BACLOFEN 10 MG PO TABS: 10.0000 mg | ORAL_TABLET | Freq: Three times a day (TID) | ORAL | Status: DC

## 2012-09-05 NOTE — Assessment & Plan Note (Signed)
No acute swelling, she is describing more inflammatory arthritis, will check ESR, RF, ANA as these will be needed by OB if positive, recent stress with her job may be causing the flares. If pregnant understands NSAIDS should not be used

## 2012-09-05 NOTE — Assessment & Plan Note (Signed)
Faint positive test, she had had very irregular cycles will obtain serum beta HCG

## 2012-09-05 NOTE — Assessment & Plan Note (Signed)
She is trying to work out, discussed exercise during pregnancy

## 2012-09-05 NOTE — Assessment & Plan Note (Signed)
Discussed Cat C for baclofen in pregnancy, she uses as needed currently

## 2012-09-05 NOTE — Patient Instructions (Signed)
Get the labs done  F/U as needed

## 2012-09-05 NOTE — Progress Notes (Signed)
  Subjective:    Patient ID: Yvette Conley, female    DOB: 02-07-1986, 27 y.o.   MRN: [id number]  HPI  Pt presents with faint positive pregnancy test at home yesterday and today She has also been having swelling of both hand at thumbs and DIP 2nd digits on and off, she had this problem as a child and was checked for rheumatoid arthritis as well as lupus but was told this was negative. She took anti-inflammatories which helps. She's not sure what brings on the swelling   Review of Systems  GEN- denies fatigue, fever, weight loss,weakness, recent illness HEENT- denies eye drainage, change in vision, nasal discharge, CVS- denies chest pain, palpitations RESP- denies SOB, cough, wheeze ABD- denies N/V, change in stools, abd pain GU- denies dysuria, hematuria, dribbling, incontinence MSK- + joint pain, muscle aches, injury Neuro- denies headache, dizziness, syncope, seizure activity      Objective:   Physical Exam GEN- NAD, alert and oriented x3 HEENT- PERRL, EOMI, non injected sclera, pink conjunctiva, MMM, oropharynx clear Neck- Supple,  CVS- RRR, no murmur RESP-CTAB EXT- No edema Pulses- Radial, DP- 2+ MSK- Hand- no swelling noted at MIP, DIP, PIP, normal ROM hands, no swelling feet, no swann deformity       Assessment & Plan:

## 2012-09-06 ENCOUNTER — Telehealth: Payer: Self-pay | Admitting: Family Medicine

## 2012-09-06 LAB — ANA: Anti Nuclear Antibody(ANA): NEGATIVE

## 2012-09-06 LAB — RHEUMATOID FACTOR: Rhuematoid fact SerPl-aCnc: <10 IU/mL (ref <=14)

## 2012-09-06 NOTE — Telephone Encounter (Signed)
Pt notified of + pregnancy results, labs will be faxed to her GYN,  Inflammatory markers are negative

## 2012-10-12 LAB — OB RESULTS CONSOLE RUBELLA ANTIBODY, IGM: Rubella: IMMUNE

## 2012-10-12 LAB — OB RESULTS CONSOLE HIV ANTIBODY (ROUTINE TESTING): HIV: NONREACTIVE

## 2012-10-12 LAB — OB RESULTS CONSOLE ABO/RH

## 2013-02-22 LAB — OB RESULTS CONSOLE ANTIBODY SCREEN: Antibody Screen: NEGATIVE

## 2013-03-08 ENCOUNTER — Encounter: Payer: BC Managed Care – PPO | Attending: Obstetrics and Gynecology

## 2013-03-08 VITALS — Ht 67.5 in | Wt 242.8 lb

## 2013-03-08 DIAGNOSIS — O99810 Abnormal glucose complicating pregnancy: Secondary | ICD-10-CM

## 2013-03-08 DIAGNOSIS — Z713 Dietary counseling and surveillance: Secondary | ICD-10-CM | POA: Insufficient documentation

## 2013-03-10 NOTE — Progress Notes (Signed)
  Patient was seen on 03/08/13 for Gestational Diabetes self-management class at the [facility]. The following learning objectives were met by the patient during this course:   States the definition of Gestational Diabetes  States why dietary management is important in controlling blood glucose  Describes the effects each nutrient has on blood glucose levels  Demonstrates ability to create a balanced meal plan  Demonstrates carbohydrate counting   States when to check blood glucose levels  Demonstrates proper blood glucose monitoring techniques  States the effect of stress and exercise on blood glucose levels  States the importance of limiting caffeine and abstaining from alcohol and smoking   Plan:  Aim for 2 Carb Choices per meal (30 grams) +/- 1 either way for breakfast, 3 Carb choices for lunch and dinner (45 grams) Aim for 0-2 Carbs per snack if hungry  Consider reading food labels for Total Carbohydrate and Fat Grams of foods Consider  increasing your activity level by walking daily as tolerated Consider checking BG FBS and pp per day as directed by MD   Blood glucose monitor given: Accu-Chek Nano Lot # [id number] Exp: 05/14/14 Blood glucose reading: 147  Patient instructed to monitor glucose levels: FBS: 60 - <90 1 hour: <140 2 hour: <120  *Patient received handouts:  Nutrition Diabetes and Pregnancy  Carbohydrate Counting List  Meal planning worksheet  Patient will be seen for follow-up as needed.

## 2013-05-10 ENCOUNTER — Other Ambulatory Visit: Payer: Self-pay | Admitting: Obstetrics and Gynecology

## 2013-05-10 ENCOUNTER — Encounter (HOSPITAL_COMMUNITY): Payer: Self-pay | Admitting: *Deleted

## 2013-05-10 ENCOUNTER — Telehealth (HOSPITAL_COMMUNITY): Payer: Self-pay | Admitting: *Deleted

## 2013-05-10 NOTE — Telephone Encounter (Signed)
Preadmission screen  

## 2013-05-14 ENCOUNTER — Inpatient Hospital Stay (HOSPITAL_COMMUNITY): Payer: BC Managed Care – PPO | Admitting: Anesthesiology

## 2013-05-14 ENCOUNTER — Inpatient Hospital Stay (HOSPITAL_COMMUNITY)
Admission: RE | Admit: 2013-05-14 | Discharge: 2013-05-17 | DRG: 775 | Disposition: A | Discharge status: Home / Self Care | Payer: BC Managed Care – PPO | Source: Ambulatory Visit | Attending: Obstetrics and Gynecology | Admitting: Obstetrics and Gynecology

## 2013-05-14 ENCOUNTER — Encounter (HOSPITAL_COMMUNITY): Payer: Self-pay

## 2013-05-14 ENCOUNTER — Encounter (HOSPITAL_COMMUNITY): Payer: BC Managed Care – PPO | Admitting: Anesthesiology

## 2013-05-14 DIAGNOSIS — O360990 Maternal care for other rhesus isoimmunization, unspecified trimester, not applicable or unspecified: Secondary | ICD-10-CM | POA: Diagnosis present

## 2013-05-14 DIAGNOSIS — D62 Acute posthemorrhagic anemia: Secondary | ICD-10-CM | POA: Diagnosis not present

## 2013-05-14 DIAGNOSIS — O99814 Abnormal glucose complicating childbirth: Principal | ICD-10-CM | POA: Diagnosis present

## 2013-05-14 DIAGNOSIS — O99892 Other specified diseases and conditions complicating childbirth: Secondary | ICD-10-CM | POA: Diagnosis present

## 2013-05-14 DIAGNOSIS — IMO0001 Reserved for inherently not codable concepts without codable children: Secondary | ICD-10-CM | POA: Diagnosis present

## 2013-05-14 DIAGNOSIS — O9903 Anemia complicating the puerperium: Secondary | ICD-10-CM | POA: Diagnosis not present

## 2013-05-14 DIAGNOSIS — Z22330 Carrier of Group B streptococcus: Secondary | ICD-10-CM

## 2013-05-14 LAB — CBC
HCT: 32.3 % — ABNORMAL LOW (ref 36.0–46.0)
Hemoglobin: 11 g/dL — ABNORMAL LOW (ref 12.0–15.0)
MCH: 30.7 pg (ref 26.0–34.0)
MCHC: 34.1 g/dL (ref 30.0–36.0)
Platelets: 269 10*3/uL (ref 150–400)
RDW: 12.9 % (ref 11.5–15.5)
WBC: 8.8 10*3/uL (ref 4.0–10.5)

## 2013-05-14 LAB — GLUCOSE, CAPILLARY
Glucose-Capillary: 77 mg/dL (ref 70–99)
Glucose-Capillary: 89 mg/dL (ref 70–99)
Glucose-Capillary: 83 mg/dL (ref 70–99)

## 2013-05-14 LAB — TYPE AND SCREEN
ABO/RH(D): B NEG
Antibody Screen: NEGATIVE

## 2013-05-14 LAB — GLUCOSE, RANDOM: Glucose, Bld: 139 mg/dL — ABNORMAL HIGH (ref 70–99)

## 2013-05-14 LAB — RPR: RPR Ser Ql: NONREACTIVE

## 2013-05-14 LAB — ABO/RH: ABO/RH(D): B NEG

## 2013-05-14 MED ORDER — OXYTOCIN 40 UNITS IN LACTATED RINGERS INFUSION - SIMPLE MED
62.5000 mL/h | INTRAVENOUS | Status: DC
  Administered 2013-05-15: 62.5 mL/h via INTRAVENOUS

## 2013-05-14 MED ORDER — PHENYLEPHRINE 40 MCG/ML (10ML) SYRINGE FOR IV PUSH (FOR BLOOD PRESSURE SUPPORT)
80.0000 ug | PREFILLED_SYRINGE | INTRAVENOUS | Status: DC | PRN
  Filled 2013-05-14: qty 2

## 2013-05-14 MED ORDER — TERBUTALINE SULFATE 1 MG/ML IJ SOLN: 0.2500 mg | Freq: Once | INTRAMUSCULAR | Status: AC | PRN

## 2013-05-14 MED ORDER — LIDOCAINE HCL (PF) 1 % IJ SOLN
INTRAMUSCULAR | Status: DC | PRN
  Administered 2013-05-14: 99 mL
  Administered 2013-05-14: 9 mL

## 2013-05-14 MED ORDER — FLEET ENEMA 7-19 GM/118ML RE ENEM: 1.0000 | ENEMA | RECTAL | Status: DC | PRN

## 2013-05-14 MED ORDER — IBUPROFEN 600 MG PO TABS: 600.0000 mg | ORAL_TABLET | Freq: Four times a day (QID) | ORAL | Status: DC | PRN

## 2013-05-14 MED ORDER — DIPHENHYDRAMINE HCL 50 MG/ML IJ SOLN: 12.5000 mg | INTRAMUSCULAR | Status: DC | PRN

## 2013-05-14 MED ORDER — PHENYLEPHRINE 40 MCG/ML (10ML) SYRINGE FOR IV PUSH (FOR BLOOD PRESSURE SUPPORT)
80.0000 ug | PREFILLED_SYRINGE | INTRAVENOUS | Status: DC | PRN
  Filled 2013-05-14: qty 10
  Filled 2013-05-14: qty 2

## 2013-05-14 MED ORDER — LACTATED RINGERS IV SOLN: 500.0000 mL | INTRAVENOUS | Status: DC | PRN

## 2013-05-14 MED ORDER — OXYTOCIN 40 UNITS IN LACTATED RINGERS INFUSION - SIMPLE MED
1.0000 m[IU]/min | INTRAVENOUS | Status: DC
  Administered 2013-05-14: 16 m[IU]/min via INTRAVENOUS
  Administered 2013-05-14 (×2): 12 m[IU]/min via INTRAVENOUS
  Administered 2013-05-14: 13.333 m[IU]/min via INTRAVENOUS
  Administered 2013-05-14: 12 m[IU]/min via INTRAVENOUS
  Administered 2013-05-14: 14 m[IU]/min via INTRAVENOUS
  Administered 2013-05-14: 2 m[IU]/min via INTRAVENOUS
  Filled 2013-05-14: qty 1000

## 2013-05-14 MED ORDER — FENTANYL 2.5 MCG/ML BUPIVACAINE 1/10 % EPIDURAL INFUSION (WH - ANES)
14.0000 mL/h | INTRAMUSCULAR | Status: DC | PRN
  Administered 2013-05-14 – 2013-05-15 (×2): 14 mL/h via EPIDURAL
  Filled 2013-05-14 (×2): qty 125

## 2013-05-14 MED ORDER — BUTORPHANOL TARTRATE 1 MG/ML IJ SOLN
1.0000 mg | INTRAMUSCULAR | Status: DC | PRN
  Administered 2013-05-14: 1 mg via INTRAVENOUS
  Filled 2013-05-14: qty 1

## 2013-05-14 MED ORDER — OXYCODONE-ACETAMINOPHEN 5-325 MG PO TABS: 1.0000 | ORAL_TABLET | ORAL | Status: DC | PRN

## 2013-05-14 MED ORDER — LACTATED RINGERS IV SOLN: 500.0000 mL | Freq: Once | INTRAVENOUS | Status: DC

## 2013-05-14 MED ORDER — LACTATED RINGERS IV SOLN
INTRAVENOUS | Status: DC
  Administered 2013-05-14: 22:00:00 via INTRAVENOUS
  Administered 2013-05-14: 1000 mL via INTRAVENOUS

## 2013-05-14 MED ORDER — PENICILLIN G POTASSIUM 5000000 UNITS IJ SOLR
2.5000 10*6.[IU] | INTRAMUSCULAR | Status: DC
  Administered 2013-05-14 – 2013-05-15 (×4): 2.5 10*6.[IU] via INTRAVENOUS
  Filled 2013-05-14 (×8): qty 2.5

## 2013-05-14 MED ORDER — OXYTOCIN BOLUS FROM INFUSION
500.0000 mL | INTRAVENOUS | Status: DC
  Administered 2013-05-15: 500 mL via INTRAVENOUS

## 2013-05-14 MED ORDER — FENTANYL 2.5 MCG/ML BUPIVACAINE 1/10 % EPIDURAL INFUSION (WH - ANES)
INTRAMUSCULAR | Status: AC
  Filled 2013-05-14: qty 125

## 2013-05-14 MED ORDER — EPHEDRINE 5 MG/ML INJ
10.0000 mg | INTRAVENOUS | Status: DC | PRN
  Filled 2013-05-14: qty 2

## 2013-05-14 MED ORDER — LIDOCAINE HCL (PF) 1 % IJ SOLN
30.0000 mL | INTRAMUSCULAR | Status: DC | PRN
  Administered 2013-05-15: 30 mL via SUBCUTANEOUS
  Filled 2013-05-14 (×2): qty 30

## 2013-05-14 MED ORDER — ACETAMINOPHEN 325 MG PO TABS
650.0000 mg | ORAL_TABLET | ORAL | Status: DC | PRN
  Administered 2013-05-15: 650 mg via ORAL
  Filled 2013-05-14: qty 2

## 2013-05-14 MED ORDER — ONDANSETRON HCL 4 MG/2ML IJ SOLN
4.0000 mg | Freq: Four times a day (QID) | INTRAMUSCULAR | Status: DC | PRN
  Administered 2013-05-14: 4 mg via INTRAVENOUS
  Filled 2013-05-14: qty 2

## 2013-05-14 MED ORDER — CITRIC ACID-SODIUM CITRATE 334-500 MG/5ML PO SOLN: 30.0000 mL | ORAL | Status: DC | PRN

## 2013-05-14 MED ORDER — EPHEDRINE 5 MG/ML INJ
10.0000 mg | INTRAVENOUS | Status: DC | PRN
  Filled 2013-05-14: qty 4
  Filled 2013-05-14: qty 2

## 2013-05-14 MED ORDER — FENTANYL 2.5 MCG/ML BUPIVACAINE 1/10 % EPIDURAL INFUSION (WH - ANES)
INTRAMUSCULAR | Status: DC | PRN
  Administered 2013-05-14: 14 mL/h via EPIDURAL

## 2013-05-14 MED ORDER — PENICILLIN G POTASSIUM 5000000 UNITS IJ SOLR
5.0000 10*6.[IU] | Freq: Once | INTRAVENOUS | Status: AC
  Administered 2013-05-14: 5 10*6.[IU] via INTRAVENOUS
  Filled 2013-05-14: qty 5
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
## 2013-05-14 NOTE — Progress Notes (Signed)
Yvette Conley is a 27 y.o. G1P0 at [redacted]w[redacted]d by LMP admitted for induction of labor due to Gestational diabetes.  Subjective: No chief complaint on file.  Epidural Objective: BP 127/71  Pulse 94  Temp(Src) 97.4 F (36.3 C) (Axillary)  Resp 20  Ht 5\' 7"  (1.702 m)  Wt 112.038 kg (247 lb)  BMI 38.68 kg/m2  LMP 07/12/2012      FHT:  FHR: 130 bpm, variability: moderate,  accelerations:  Present,  decelerations:  Present small variables UC:   irregular, every 2-3 minutes SVE:   5 cm dilated, 100% effaced, -1 station per RN Tracing: cat 1  Labs: Lab Results  Component Value Date   WBC 8.8 05/14/2013   HGB 11.0* 05/14/2013   HCT 32.3* 05/14/2013   MCV 90.2 05/14/2013   PLT 269 05/14/2013    Assessment / Plan: Induction of labor due to gestational diabetes,  progressing well on pitocin GBS cx (+) on IV PCN P) cont pitocin. Return to exaggerated sims position. BS controlled  Anticipated MOD:  NSVD  Jillian Pianka A 05/14/2013, 6:44 PM

## 2013-05-14 NOTE — Anesthesia Preprocedure Evaluation (Signed)
Anesthesia Evaluation  Patient identified by MRN, date of birth, ID band Patient awake    Reviewed: Allergy & Precautions, H&P , NPO status , Patient's Chart, lab work & pertinent test results  Airway Mallampati: II TM Distance: >3 FB Neck ROM: full    Dental no notable dental hx.    Pulmonary neg pulmonary ROS,    Pulmonary exam normal       Cardiovascular negative cardio ROS      Neuro/Psych    GI/Hepatic negative GI ROS, Neg liver ROS,   Endo/Other  diabetes, GestationalMorbid obesity  Renal/GU negative Renal ROS     Musculoskeletal   Abdominal (+) + obese,   Peds  Hematology   Anesthesia Other Findings   Reproductive/Obstetrics (+) Pregnancy                           Anesthesia Physical Anesthesia Plan  ASA: III  Anesthesia Plan: Epidural   Post-op Pain Management:    Induction:   Airway Management Planned:   Additional Equipment:   Intra-op Plan:   Post-operative Plan:   Informed Consent: I have reviewed the patients History and Physical, chart, labs and discussed the procedure including the risks, benefits and alternatives for the proposed anesthesia with the patient or authorized representative who has indicated his/her understanding and acceptance.     Plan Discussed with:   Anesthesia Plan Comments:         Anesthesia Quick Evaluation

## 2013-05-14 NOTE — Anesthesia Procedure Notes (Signed)
Epidural Patient location during procedure: OB Start time: 05/14/2013 4:55 PM End time: 05/14/2013 5:01 PM  Staffing Anesthesiologist: Leilani Able Performed by: anesthesiologist   Preanesthetic Checklist Completed: patient identified, surgical consent, pre-op evaluation, timeout performed, IV checked, risks and benefits discussed and monitors and equipment checked  Epidural Patient position: sitting Prep: site prepped and draped and DuraPrep Patient monitoring: continuous pulse ox and blood pressure Approach: midline Injection technique: LOR air  Needle:  Needle type: Tuohy  Needle gauge: 17 G Needle length: 9 cm and 9 Needle insertion depth: 6 cm Catheter type: closed end flexible Catheter size: 19 Gauge Catheter at skin depth: 11 cm Test dose: negative and Other  Assessment Sensory level: T9 Events: blood not aspirated, injection not painful, no injection resistance, negative IV test and no paresthesia  Additional Notes Reason for block:procedure for pain

## 2013-05-14 NOTE — Progress Notes (Signed)
Yvette Conley is a 27 y.o. G1P0 at [redacted]w[redacted]d by LMP admitted for induction of labor due to Gestational diabetes.  Subjective: No chief complaint on file.  No complaints Objective: BP 133/82  Pulse 98  Temp(Src) 98 F (36.7 C) (Oral)  Resp 20  Ht 5\' 7"  (1.702 m)  Wt 112.038 kg (247 lb)  BMI 38.68 kg/m2  LMP 07/12/2012      FHT:  Baseline 140 (+) accel to 150 (+) intermittent variable Ctx q2-3 mins  Ve 3/80/-1 AROM clear fluid  IUPC placed  Labs: Lab Results  Component Value Date   WBC 8.8 05/14/2013   HGB 11.0* 05/14/2013   HCT 32.3* 05/14/2013   MCV 90.2 05/14/2013   PLT 269 05/14/2013    Assessment / Plan: Gestational diabetes Term gestation Rh negative  P) exaggerated right sims. Cont pitocin. Amnioinfusion. Analgesic prn. BS q 2 hr   Anticipated MOD:  NSVD  Inmer Nix A 05/14/2013, 2:11 PM

## 2013-05-15 ENCOUNTER — Encounter (HOSPITAL_COMMUNITY): Payer: Self-pay

## 2013-05-15 LAB — GLUCOSE, CAPILLARY: Glucose-Capillary: 90 mg/dL (ref 70–99)

## 2013-05-15 MED ORDER — SENNOSIDES-DOCUSATE SODIUM 8.6-50 MG PO TABS
2.0000 | ORAL_TABLET | ORAL | Status: DC
  Administered 2013-05-15 – 2013-05-16 (×2): 2 via ORAL
  Filled 2013-05-15 (×2): qty 2

## 2013-05-15 MED ORDER — ZOLPIDEM TARTRATE 5 MG PO TABS: 5.0000 mg | ORAL_TABLET | Freq: Every evening | ORAL | Status: DC | PRN

## 2013-05-15 MED ORDER — DIPHENHYDRAMINE HCL 25 MG PO CAPS: 25.0000 mg | ORAL_CAPSULE | Freq: Four times a day (QID) | ORAL | Status: DC | PRN

## 2013-05-15 MED ORDER — OXYCODONE-ACETAMINOPHEN 5-325 MG PO TABS
1.0000 | ORAL_TABLET | ORAL | Status: DC | PRN
  Administered 2013-05-15: 1 via ORAL
  Filled 2013-05-15: qty 1

## 2013-05-15 MED ORDER — FERROUS SULFATE 325 (65 FE) MG PO TABS
325.0000 mg | ORAL_TABLET | Freq: Two times a day (BID) | ORAL | Status: DC
  Administered 2013-05-15 – 2013-05-17 (×5): 325 mg via ORAL
  Filled 2013-05-15 (×5): qty 1

## 2013-05-15 MED ORDER — WITCH HAZEL-GLYCERIN EX PADS: 1.0000 "application " | MEDICATED_PAD | CUTANEOUS | Status: DC | PRN

## 2013-05-15 MED ORDER — BENZOCAINE-MENTHOL 20-0.5 % EX AERO
1.0000 "application " | INHALATION_SPRAY | CUTANEOUS | Status: DC | PRN
  Administered 2013-05-15: 1 via TOPICAL
  Filled 2013-05-15: qty 56

## 2013-05-15 MED ORDER — DIBUCAINE 1 % RE OINT: 1.0000 "application " | TOPICAL_OINTMENT | RECTAL | Status: DC | PRN

## 2013-05-15 MED ORDER — IBUPROFEN 600 MG PO TABS
600.0000 mg | ORAL_TABLET | Freq: Four times a day (QID) | ORAL | Status: DC
  Administered 2013-05-15 – 2013-05-17 (×9): 600 mg via ORAL
  Filled 2013-05-15 (×9): qty 1

## 2013-05-15 MED ORDER — ONDANSETRON HCL 4 MG/2ML IJ SOLN: 4.0000 mg | INTRAMUSCULAR | Status: DC | PRN

## 2013-05-15 MED ORDER — ONDANSETRON HCL 4 MG PO TABS: 4.0000 mg | ORAL_TABLET | ORAL | Status: DC | PRN

## 2013-05-15 MED ORDER — LANOLIN HYDROUS EX OINT: TOPICAL_OINTMENT | CUTANEOUS | Status: DC | PRN

## 2013-05-15 MED ORDER — PRENATAL MULTIVITAMIN CH
1.0000 | ORAL_TABLET | Freq: Every day | ORAL | Status: DC
  Administered 2013-05-15 – 2013-05-16 (×2): 1 via ORAL
  Filled 2013-05-15 (×2): qty 1

## 2013-05-15 MED ORDER — INFLUENZA VAC SPLIT QUAD 0.5 ML IM SUSP
0.5000 mL | INTRAMUSCULAR | Status: AC
Start: 2013-05-16 — End: 2013-05-17
  Administered 2013-05-17: 0.5 mL via INTRAMUSCULAR
  Filled 2013-05-15: qty 0.5

## 2013-05-15 MED ORDER — SIMETHICONE 80 MG PO CHEW: 80.0000 mg | CHEWABLE_TABLET | ORAL | Status: DC | PRN

## 2013-05-15 NOTE — Progress Notes (Signed)
S: c/o headache Pushing now after laboring vtx down  O: Fully dilated since 10: 30 pm Tracing: baseline 150 some variable decels Ctx q 2-3 mins BS 90  IMP: Class A1 GDM GBS cx (+) on IV PCN Term gestation P) cont pushing. Tylenol for h/a

## 2013-05-15 NOTE — Progress Notes (Signed)
Patient was referred for history of depression/anxiety.  * Referral screened out by Clinical Social Worker because none of the following criteria appear to apply:  ~ History of anxiety/depression during this pregnancy, or of post-partum depression.  ~ Diagnosis of anxiety and/or depression within last 3-4 years, per pt.  ~ History of depression due to pregnancy loss/loss of child  OR  * Patient's symptoms currently being treated with medication and/or therapy.  Please contact the Clinical Social Worker if needs arise, or by the patient's request.       

## 2013-05-15 NOTE — Lactation Note (Signed)
This note was copied from the chart of Yvette Marinna Mccranie. Lactation Consultation Note Initial visit at 21 hours of age.  [facility] LC resources given and discussed.  Mom reports breastfeeding is going well.  Baby latched to left breast and unlatched just after I entered room. Discussed proper latch, positioning, pillow support, hand expression and cue feeding with mom and FOB.  Burped baby and attempted latch on right breast.  Baby fussy.  Used gloved finger to assess suck, baby disorganized.  When removed finger baby roots.  Baby to right breast in modified cross cradle.  Baby easily latches well for about 2 minutes and falls asleep.  Encourage mom to listen for swallows and stimulate baby to stay awake during feedings.  Mom reports milk nipple pain on right side.  Slightly pink, encouraged to rub in expressed breast milk.  Mom denies pain with latch at this time.  Mom to call for assist as needed.   Patient Name: Yvette Conley [id number] Date: 05/15/2013 Reason for consult: Initial assessment   Maternal Data Formula Feeding for Exclusion: No Infant to breast within first hour of birth: Yes Has patient been taught Hand Expression?: Yes Does the patient have breastfeeding experience prior to this delivery?: No  Feeding Feeding Type: Breast Fed Length of feed: 14 min  LATCH Score/Interventions Latch: Grasps breast easily, tongue down, lips flanged, rhythmical sucking.  Audible Swallowing: A few with stimulation Intervention(s): Alternate breast massage;Hand expression;Skin to skin  Type of Nipple: Everted at rest and after stimulation  Comfort (Breast/Nipple): Soft / non-tender     Hold (Positioning): Assistance needed to correctly position infant at breast and maintain latch. Intervention(s): Position options;Support Pillows;Breastfeeding basics reviewed  LATCH Score: 8  Lactation Tools Discussed/Used     Consult Status Consult Status: Follow-up Date: 05/16/13 Follow-up type:  In-patient    Jannifer Rodney 05/15/2013, 10:45 PM

## 2013-05-16 ENCOUNTER — Inpatient Hospital Stay (HOSPITAL_COMMUNITY)
Admission: AD | Admit: 2013-05-16 | Payer: BC Managed Care – PPO | Source: Ambulatory Visit | Admitting: Obstetrics and Gynecology

## 2013-05-16 ENCOUNTER — Encounter (HOSPITAL_COMMUNITY): Payer: Self-pay

## 2013-05-16 LAB — CBC
Hemoglobin: 9.2 g/dL — ABNORMAL LOW (ref 12.0–15.0)
MCHC: 34.8 g/dL (ref 30.0–36.0)
Platelets: 212 10*3/uL (ref 150–400)
RDW: 13.2 % (ref 11.5–15.5)
WBC: 8.4 10*3/uL (ref 4.0–10.5)

## 2013-05-16 MED ORDER — RHO D IMMUNE GLOBULIN 1500 UNIT/2ML IJ SOLN
300.0000 ug | Freq: Once | INTRAMUSCULAR | Status: AC
  Administered 2013-05-17: 300 ug via INTRAMUSCULAR
  Filled 2013-05-16: qty 2

## 2013-05-16 NOTE — Progress Notes (Signed)
Patient ID: KIARRA KIDD, female   DOB: [birth year], 27 y.o.   MRN: [id number] PPD # 1  G1 P1 s/p SVD  Subjective: Pt reports feeling well, sore/ Pain controlled with ibuprofen and rare percocet Tolerating po/ Voiding without problems/ No n/v Bleeding is light Newborn info:  Information for the patient's newborn:  Dashanti, Burr Girl Shian [[id number]]  female Feeding: breast   Objective:  VS: Blood pressure 100/67, pulse 87, temperature 97.8 F (36.6 C), temperature source Oral, resp. rate 18.    Recent Labs  05/14/13 0755 05/16/13 0500  WBC 8.8 8.4  HGB 11.0* 9.2*  HCT 32.3* 26.4*  PLT 269 212    Blood type: B NEG Rubella: Immune    Physical Exam:  General:  alert, cooperative and no distress CV: Regular rate and rhythm Resp: clear Abdomen: soft, nontender, normal bowel sounds Uterine Fundus: firm, below umbilicus, nontender Perineum: healing with good reapproximation Lochia: minimal Ext: edema trace and Homans sign is negative, no sign of DVT   A/P: PPD # 1/ G1P1001/ S/P: SVD w/ 2nd deg lac with repair Hx GDM (A1); glucose at MN 90 Mild ABL Anemia; taking iron supplement Doing well Continue routine post partum orders Anticipate D/C home in AM    Demetrius Revel, MSN, [facility] 05/16/2013, 10:07 AM

## 2013-05-16 NOTE — Progress Notes (Signed)
Rhophylac was ready for patient.  I went to give to mom and she stated that she did not receive during her pregnancy and that her husband got his blood typed and was also negative.  Mother's records show that she is B Negative, she states that FOB went to a clinic and got his typed and was also negative and those results were sent to her OB's office. This is stated in her prenatal records, that she is negative and that husband is negative as well, no rhophylac needed.  I will call pediatrician now.

## 2013-05-16 NOTE — Progress Notes (Signed)
Spoke to [employer], NP, and explained to her that baby's blood type was done twice and came back O Positive.  Mom states that she is negative and that her husband is negative and that she did not receive Rhophylac during her pregnancy. In mother's prenatal records, there is reference to husband being negative as well and no Rhophylac needed.  [name] will investigate mother's records and call [facility] RN back.

## 2013-05-16 NOTE — Progress Notes (Signed)
Philipp Ovens, NP called back to confirm that she had went through her records and found that the father of the baby's blood type was A+ and that his antibodies were negative.  She believes that this is where the mix-up happened when the family was told that the father of the baby was negative which was not correct. Explained to both parents and they verbalize understanding of the need for Rhogam.

## 2013-05-16 NOTE — Anesthesia Postprocedure Evaluation (Signed)
Anesthesia Post Note  Patient: Yvette Conley  Procedure(s) Performed: * No procedures listed *  Anesthesia type: Epidural  Patient location: Mother/Baby  Post pain: Pain level controlled  Post assessment: Post-op Vital signs reviewed  Last Vitals:  Filed Vitals:   05/16/13 0520  BP: 100/67  Pulse: 87  Temp: 36.6 C  Resp: 18    Post vital signs: Reviewed  Level of consciousness: awake  Complications: No apparent anesthesia complications

## 2013-05-17 MED ORDER — IBUPROFEN 600 MG PO TABS: 600.0000 mg | ORAL_TABLET | Freq: Four times a day (QID) | ORAL | Status: DC

## 2013-05-17 MED ORDER — FERROUS SULFATE 325 (65 FE) MG PO TABS: 325.0000 mg | ORAL_TABLET | Freq: Two times a day (BID) | ORAL | Status: DC

## 2013-05-17 NOTE — Progress Notes (Signed)
PPD #2- SVD  Subjective:   Reports feeling good Tolerating po/ No nausea or vomiting Bleeding is light Pain controlled with Motrin Up ad lib / ambulatory / voiding without problems Newborn: breastfeeding    Objective:   VS: VS:  Filed Vitals:   05/15/13 1717 05/16/13 0520 05/16/13 1747 05/17/13 0717  BP: 108/69 100/67 111/76 107/69  Pulse: 98 87 84 91  Temp: 97.6 F (36.4 C) 97.8 F (36.6 C) 97.7 F (36.5 C) 98.4 F (36.9 C)  TempSrc: Oral Oral Oral Oral  Resp: 18 18 18 17   Height:      Weight:      SpO2:    98%    LABS:  Recent Labs  05/16/13 0500  WBC 8.4  HGB 9.2*  PLT 212   Blood type: --/--/B NEG (12/02 0540) Rubella: Immune (04/30 0000)                I&O: Intake/Output   None     Physical Exam: Alert and oriented X3 Abdomen: soft, non-tender, non-distended  Fundus: firm, non-tender, U-1 Perineum: Well approximated, no significant erythema, edema, or drainage; healing well. Lochia: small Extremities: no edema, no calf pain or tenderness    Assessment: PPD # 2 G1P1001/ S/P:induced vaginal, 2nd degree laceration Doing well - stable for discharge home   Plan: Discharge home RX's:  Ibuprofen 600mg  po Q 6 hrs prn pain #30 Refill x 0 Niferex 150mg  po BID #30 Refill x 1 Routine pp visit in [facility] booklet given    Donette Larry, N MSN, CNM 05/17/2013, 9:19 AM

## 2013-05-17 NOTE — Discharge Summary (Signed)
Obstetric Discharge Summary  Reason for Admission: induction of labor d/t A1GDM, GBS positive Prenatal Procedures: ultrasound Intrapartum Procedures: spontaneous vaginal delivery and GBS prophylaxis Postpartum Procedures: Rubella Ig Complications-Operative and Postpartum: 2nd degree perineal laceration Hemoglobin  Date Value Range Status  05/16/2013 9.2* 12.0 - 15.0 g/dL Final     HCT  Date Value Range Status  05/16/2013 26.4* 36.0 - 46.0 % Final    Physical Exam:  General: alert and cooperative Lochia: appropriate Uterine Fundus: firm Incision: healing well, no significant drainage, no dehiscence, no significant erythema DVT Evaluation: No evidence of DVT seen on physical exam. Negative Homan's sign. No cords or calf tenderness. No significant calf/ankle edema.  Discharge Diagnoses: Term Pregnancy-delivered  Discharge Information: Date: 05/17/2013 Activity: pelvic rest Diet: routine Medications: PNV, Ibuprofen and Iron Condition: stable Instructions: refer to practice specific booklet Discharge to: home   Newborn Data: Live born female on 05/15/2013 Birth Weight: 7 lb 3.2 oz (3265 g) APGAR: 8, 9  Home with mother.  Sipriano Fendley, N 05/17/2013, 11:07 AM

## 2013-05-18 LAB — RH IG WORKUP (INCLUDES ABO/RH)
ABO/RH(D): B NEG
Fetal Screen: NEGATIVE
Gestational Age(Wks): 39

## 2013-09-20 ENCOUNTER — Ambulatory Visit: Payer: Self-pay | Admitting: Family Medicine

## 2014-04-09 ENCOUNTER — Ambulatory Visit (INDEPENDENT_AMBULATORY_CARE_PROVIDER_SITE_OTHER): Payer: BC Managed Care – PPO | Admitting: Family Medicine

## 2014-04-09 ENCOUNTER — Encounter: Payer: Self-pay | Admitting: Family Medicine

## 2014-04-09 VITALS — BP 130/76 | HR 82 | Temp 98.6°F | Resp 16 | Ht 67.0 in | Wt 246.0 lb

## 2014-04-09 DIAGNOSIS — R5382 Chronic fatigue, unspecified: Secondary | ICD-10-CM

## 2014-04-09 DIAGNOSIS — M62830 Muscle spasm of back: Secondary | ICD-10-CM

## 2014-04-09 DIAGNOSIS — R0789 Other chest pain: Secondary | ICD-10-CM

## 2014-04-09 DIAGNOSIS — N3000 Acute cystitis without hematuria: Secondary | ICD-10-CM

## 2014-04-09 DIAGNOSIS — R1084 Generalized abdominal pain: Secondary | ICD-10-CM

## 2014-04-09 LAB — CBC W/MCH & 3 PART DIFF
HEMATOCRIT: 35.6 % — AB (ref 36.0–46.0)
Hemoglobin: 12.4 g/dL (ref 12.0–15.0)
Lymphocytes Relative: 24 % (ref 12–46)
Lymphs Abs: 2.4 10*3/uL (ref 0.7–4.0)
MCH: 31.4 pg (ref 26.0–34.0)
MCHC: 34.8 g/dL (ref 30.0–36.0)
MCV: 90.1 fL (ref 78.0–100.0)
Neutro Abs: 6.7 10*3/uL (ref 1.7–7.7)
Neutrophils Relative %: 68 % (ref 43–77)
Platelets: 335 10*3/uL (ref 150–400)
RBC: 3.95 MIL/uL (ref 3.87–5.11)
RDW: 12.4 % (ref 11.5–15.5)
WBC mixed population %: 8 % (ref 3–18)
WBC: 9.9 10*3/uL (ref 4.0–10.5)
WBCMIX: 0.8 10*3/uL (ref 0.1–1.8)

## 2014-04-09 LAB — URINALYSIS, ROUTINE W REFLEX MICROSCOPIC
Bilirubin Urine: NEGATIVE
Glucose, UA: NEGATIVE mg/dL
HGB URINE DIPSTICK: NEGATIVE
KETONES UR: NEGATIVE mg/dL
NITRITE: NEGATIVE
PROTEIN: NEGATIVE mg/dL
SPECIFIC GRAVITY, URINE: 1.02 (ref 1.005–1.030)
UROBILINOGEN UA: 1 mg/dL (ref 0.0–1.0)
pH: 6.5 (ref 5.0–8.0)

## 2014-04-09 LAB — URINALYSIS, MICROSCOPIC ONLY
Casts: NONE SEEN
Crystals: NONE SEEN

## 2014-04-09 LAB — PREGNANCY, URINE: Preg Test, Ur: NEGATIVE

## 2014-04-09 MED ORDER — CIPROFLOXACIN HCL 500 MG PO TABS
500.0000 mg | ORAL_TABLET | Freq: Two times a day (BID) | ORAL | Status: DC
Start: 2014-04-09 — End: 2014-10-08

## 2014-04-09 MED ORDER — CYCLOBENZAPRINE HCL 10 MG PO TABS: 10.0000 mg | ORAL_TABLET | Freq: Three times a day (TID) | ORAL | Status: DC | PRN

## 2014-04-09 NOTE — Progress Notes (Signed)
Patient ID: Yvette Conley, female   DOB: 02-15-1986, 28 y.o.   MRN: [id number]   Subjective:    Patient ID: Yvette Conley, female    DOB: 02-15-1986, 28 y.o.   MRN: [id number]  Patient presents for Back Pain  patient here with a constellation of symptoms. About a week ago she had upper respiratory infection which at sore throat cough with minimal production muscle aches on Thursday she had a severe bout of muscle spasms in her lower back which she has had before she's also been very fatigued to the past couple weeks. The past couple days she had a couple episodes of nausea as well as some pressure with urination in her cycle is actually a week late. She did take a home pregnancy test throughout all of the symptoms and it was negative. She's not had any vomiting. She didn't states that she had a couple days of chest pressure and tightness she felt tingling in her right jaw and her left arm and only lasted about 5 minutes and then it will come back again. 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    Review Of Systems:  GEN- + fatigue, denies fever, weight loss,weakness, recent illness HEENT- denies eye drainage, change in vision, nasal discharge, CVS- denies chest pain, palpitations RESP- denies SOB, cough, wheeze ABD- + N/V, change in stools, +abd pain GU- + dysuria, hematuria, dribbling, incontinence MSK- denies joint pain, +muscle aches, injury Neuro- denies headache, dizziness, syncope, seizure activity       Objective:    BP 130/76  Pulse 82  Temp(Src) 98.6 F (37 C) (Oral)  Resp 16  Ht 5\' 7"  (1.702 m)  Wt 246 lb (111.585 kg)  BMI 38.52 kg/m2  LMP 02/28/2014 GEN- NAD, alert and oriented x3, fatigued appearing HEENT- PERRL, EOMI, non injected sclera, pink conjunctiva, MMM, oropharynx clear Neck- Supple, no thyromegaly CVS- RRR, no  murmur RESP-CTAB ABD-NABS,soft,mild TTP suprapubic area,ND, no CVA tenderness MSK- Spine NT, mild paraspinal tenderness and spasm, neg SLR, FROM spine PSYCH- normal affect and mood EXT- No edema Pulses- Radial, DP- 2+  EKG- NSR, no ST changes      Assessment & Plan:      Problem List Items Addressed This Visit   Back spasm     Likley set off by URI and coughing, given flexeril prn at bedtime, no red flags on exam    Acute cystitis    Other Visit Diagnoses   Generalized abdominal pain    -  Primary    fairly benign exam, UTI shows mild leukocytosis with symptoms will treat for uncomplicated UTI, 3 days antibiotics, push fluids, CBC unremarkable, CMET pending    Relevant Orders       Urinalysis, Routine w reflex microscopic (Completed)       Pregnancy, urine (Completed)       CBC w/MCH & 3 Part Diff (Completed)       Comprehensive metabolic panel (Completed)    Chronic fatigue        Relevant Orders       TSH (Completed)    Atypical chest pain        likely related to stress and fatigue, poor sleep, EKG normal, given reassurance. will not start any meds today, discussed routine       Note: This dictation was prepared with Reubin Milanragon  dictation along with smaller phrase technology. Any transcriptional errors that result from this process are unintentional.

## 2014-04-09 NOTE — Patient Instructions (Addendum)
We will call with results Use muscle relaxer for spasms Get plenty of rest and fluids Take antibiotics as prescribed Recheck pregnancy test in 1 week if no period F/U as needed

## 2014-04-10 LAB — COMPREHENSIVE METABOLIC PANEL
ALBUMIN: 4.3 g/dL (ref 3.5–5.2)
ALT: 15 U/L (ref 0–35)
AST: 15 U/L (ref 0–37)
Alkaline Phosphatase: 53 U/L (ref 39–117)
BILIRUBIN TOTAL: 0.3 mg/dL (ref 0.2–1.2)
BUN: 11 mg/dL (ref 6–23)
CO2: 27 mEq/L (ref 19–32)
Calcium: 9.2 mg/dL (ref 8.4–10.5)
Chloride: 104 mEq/L (ref 96–112)
Creat: 0.68 mg/dL (ref 0.50–1.10)
Glucose, Bld: 90 mg/dL (ref 70–99)
POTASSIUM: 4.4 meq/L (ref 3.5–5.3)
SODIUM: 139 meq/L (ref 135–145)
TOTAL PROTEIN: 7 g/dL (ref 6.0–8.3)

## 2014-04-10 LAB — TSH: TSH: 1.434 u[IU]/mL (ref 0.350–4.500)

## 2014-04-10 NOTE — Assessment & Plan Note (Signed)
Likley set off by URI and coughing, given flexeril prn at bedtime, no red flags on exam

## 2014-04-16 ENCOUNTER — Encounter: Payer: Self-pay | Admitting: Family Medicine

## 2014-04-18 ENCOUNTER — Encounter: Payer: Self-pay | Admitting: Family Medicine

## 2014-06-15 NOTE — L&D Delivery Note (Signed)
Called to attend delivery at request of Dr. Cherly Hensen Delivery Note  First Stage: Labor onset: 1417 Augmentation: Pitocin, AROM Analgesia Yvette Conley intrapartum: epidural AROM at 1417  Second Stage: Complete dilation at 1553 Onset of pushing at 1607 FHR second stage 120s then 90s two min. prior to birth  In maternal lithotomy, delivery of a viable female at 9 by CNM in OA position with restitution to LOT-right compound arm around neck No nuchal cord Cord double clamped after cessation of pulsation, cut by FOB Cord blood sample collected   Collection of cord blood donation n/a Arterial cord blood sample n/a  Third Stage: Placenta delivered via [name] intact with 3 VC @ 1619 Placenta disposition: routine disposal Uterine tone firm / bleeding small  2nd degree perineal laceration identified  Anesthesia for repair: epidural and local Repaired with 2-0 Vicryl rapide Est. Blood Loss (mL): 358  Complications: none  Mom to postpartum.  Baby to Couplet care / Skin to Skin.  Newborn: Birth Weight: pending Apgar Scores: 9/9 Feeding planned: breast  Yvette Conley, N MSN, CNM 03/12/2015, 5:18 PM

## 2014-07-31 LAB — OB RESULTS CONSOLE ABO/RH: RH Type: NEGATIVE

## 2014-07-31 LAB — OB RESULTS CONSOLE GC/CHLAMYDIA
CHLAMYDIA, DNA PROBE: NEGATIVE
Gonorrhea: NEGATIVE

## 2014-07-31 LAB — OB RESULTS CONSOLE RPR: RPR: NONREACTIVE

## 2014-07-31 LAB — OB RESULTS CONSOLE ANTIBODY SCREEN: ANTIBODY SCREEN: POSITIVE

## 2014-07-31 LAB — OB RESULTS CONSOLE RUBELLA ANTIBODY, IGM: RUBELLA: IMMUNE

## 2014-07-31 LAB — OB RESULTS CONSOLE HIV ANTIBODY (ROUTINE TESTING): HIV: NONREACTIVE

## 2014-07-31 LAB — OB RESULTS CONSOLE HEPATITIS B SURFACE ANTIGEN: Hepatitis B Surface Ag: NEGATIVE

## 2014-10-08 ENCOUNTER — Encounter: Payer: Self-pay | Admitting: Family Medicine

## 2014-10-08 ENCOUNTER — Ambulatory Visit (INDEPENDENT_AMBULATORY_CARE_PROVIDER_SITE_OTHER): Payer: BC Managed Care – PPO | Admitting: Family Medicine

## 2014-10-08 VITALS — BP 110/64 | HR 125 | Temp 100.2°F | Resp 18 | Wt 239.0 lb

## 2014-10-08 DIAGNOSIS — J101 Influenza due to other identified influenza virus with other respiratory manifestations: Secondary | ICD-10-CM

## 2014-10-08 LAB — INFLUENZA A AND B
INFLUENZA A AG: NEGATIVE
INFLUENZA B AG: POSITIVE — AB

## 2014-10-08 MED ORDER — OSELTAMIVIR PHOSPHATE 75 MG PO CAPS: 75.0000 mg | ORAL_CAPSULE | Freq: Two times a day (BID) | ORAL | Status: DC

## 2014-10-08 NOTE — Progress Notes (Signed)
Patient ID: Yvette Conley, female   DOB: 02/28/1986, 29 y.o.   MRN: [id number]   Subjective:    Patient ID: Yvette Conley, female    DOB: 02/28/1986, 29 y.o.   MRN: [id number]  Patient presents for Illness  patient here with fever or body aches mild cough headache for the past 3 days. 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 She had vomiting 1 this morning but no diarrhea. Her appetite is been decreased some but she is still drinking fluids. Her temperature this morning was 100F. She denies any shortness of breath or any chest pain. She does feel some movement she is [redacted] weeks pregnant.    Review Of Systems:  GEN-+fatigue, +fever, weight loss,weakness, recent illness HEENT- denies eye drainage, change in vision, nasal discharge, CVS- denies chest pain, palpitations RESP- denies SOB, +cough, wheeze ABD- denies N/V, change in stools, abd pain GU- denies dysuria, hematuria, dribbling, incontinence MSK- denies joint pain, +muscle aches, injury Neuro- denies headache, dizziness, syncope, seizure activity       Objective:    BP 110/64 mmHg  Pulse 125  Temp(Src) 100.2 F (37.9 C) (Oral)  Resp 18  Wt 239 lb (108.41 kg)  SpO2 99% GEN- NAD, alert and oriented x3 HEENT- PERRL, EOMI, non injected sclera, pink conjunctiva, MMM, oropharynx clear, nares clear rhinorrhea Neck- Supple, shotty LAD CVS- tachycardic  120,  no murmur RESP-CTAB ABD-NABS,soft,Gravid EXT- No edema Pulses- Radial  2+        Assessment & Plan:      Problem List Items Addressed This Visit    None    Visit Diagnoses    Influenza B    -  Primary    positive flu virus, discussed with her OB office, Tamiflu BID x 5 days, robitussin, tylenol for fever, push fluids. Given red flags. They will schedule f/u in their office    Relevant Medications    oseltamivir (TAMIFLU) 75 MG capsule    Other Relevant Orders    Influenza a and b (Completed)       Note:  This dictation was prepared with Dragon dictation along with smaller phrase technology. Any transcriptional errors that result from this process are unintentional.

## 2014-10-08 NOTE — Patient Instructions (Signed)
Take flu medication as prescribed Continue robitussin Call if you have shortness of breath, chest pain, fever that does not resolve Get plenty of fluids/ rest

## 2014-10-10 ENCOUNTER — Emergency Department (HOSPITAL_COMMUNITY)
Admission: EM | Admit: 2014-10-10 | Discharge: 2014-10-10 | Disposition: A | Discharge status: Home / Self Care | Payer: BC Managed Care – PPO | Attending: Emergency Medicine | Admitting: Emergency Medicine

## 2014-10-10 ENCOUNTER — Telehealth: Payer: Self-pay | Admitting: *Deleted

## 2014-10-10 ENCOUNTER — Encounter (HOSPITAL_COMMUNITY): Payer: Self-pay | Admitting: Emergency Medicine

## 2014-10-10 DIAGNOSIS — O99352 Diseases of the nervous system complicating pregnancy, second trimester: Secondary | ICD-10-CM | POA: Diagnosis not present

## 2014-10-10 DIAGNOSIS — Z8719 Personal history of other diseases of the digestive system: Secondary | ICD-10-CM | POA: Insufficient documentation

## 2014-10-10 DIAGNOSIS — O99282 Endocrine, nutritional and metabolic diseases complicating pregnancy, second trimester: Secondary | ICD-10-CM | POA: Diagnosis not present

## 2014-10-10 DIAGNOSIS — R509 Fever, unspecified: Secondary | ICD-10-CM | POA: Diagnosis not present

## 2014-10-10 DIAGNOSIS — Z91040 Latex allergy status: Secondary | ICD-10-CM | POA: Insufficient documentation

## 2014-10-10 DIAGNOSIS — R000 Tachycardia, unspecified: Secondary | ICD-10-CM | POA: Insufficient documentation

## 2014-10-10 DIAGNOSIS — O210 Mild hyperemesis gravidarum: Secondary | ICD-10-CM | POA: Insufficient documentation

## 2014-10-10 DIAGNOSIS — R5383 Other fatigue: Secondary | ICD-10-CM | POA: Diagnosis not present

## 2014-10-10 DIAGNOSIS — Z79899 Other long term (current) drug therapy: Secondary | ICD-10-CM | POA: Insufficient documentation

## 2014-10-10 DIAGNOSIS — O99342 Other mental disorders complicating pregnancy, second trimester: Secondary | ICD-10-CM | POA: Insufficient documentation

## 2014-10-10 DIAGNOSIS — Z3A17 17 weeks gestation of pregnancy: Secondary | ICD-10-CM | POA: Diagnosis not present

## 2014-10-10 DIAGNOSIS — O9989 Other specified diseases and conditions complicating pregnancy, childbirth and the puerperium: Secondary | ICD-10-CM | POA: Insufficient documentation

## 2014-10-10 DIAGNOSIS — G8929 Other chronic pain: Secondary | ICD-10-CM | POA: Insufficient documentation

## 2014-10-10 DIAGNOSIS — Z8619 Personal history of other infectious and parasitic diseases: Secondary | ICD-10-CM | POA: Insufficient documentation

## 2014-10-10 DIAGNOSIS — Z8632 Personal history of gestational diabetes: Secondary | ICD-10-CM | POA: Diagnosis not present

## 2014-10-10 DIAGNOSIS — R05 Cough: Secondary | ICD-10-CM | POA: Diagnosis not present

## 2014-10-10 DIAGNOSIS — E669 Obesity, unspecified: Secondary | ICD-10-CM | POA: Insufficient documentation

## 2014-10-10 DIAGNOSIS — F419 Anxiety disorder, unspecified: Secondary | ICD-10-CM | POA: Insufficient documentation

## 2014-10-10 DIAGNOSIS — R112 Nausea with vomiting, unspecified: Secondary | ICD-10-CM

## 2014-10-10 DIAGNOSIS — O99012 Anemia complicating pregnancy, second trimester: Secondary | ICD-10-CM | POA: Insufficient documentation

## 2014-10-10 LAB — CBC
HCT: 32.9 % — ABNORMAL LOW (ref 36.0–46.0)
HEMOGLOBIN: 10.8 g/dL — AB (ref 12.0–15.0)
MCH: 30.4 pg (ref 26.0–34.0)
MCHC: 32.8 g/dL (ref 30.0–36.0)
MCV: 92.7 fL (ref 78.0–100.0)
Platelets: 247 10*3/uL (ref 150–400)
RBC: 3.55 MIL/uL — AB (ref 3.87–5.11)
RDW: 12.9 % (ref 11.5–15.5)
WBC: 3.9 10*3/uL — AB (ref 4.0–10.5)

## 2014-10-10 LAB — COMPREHENSIVE METABOLIC PANEL
ALK PHOS: 67 U/L (ref 39–117)
ALT: 16 U/L (ref 0–35)
ANION GAP: 8 (ref 5–15)
AST: 20 U/L (ref 0–37)
Albumin: 3.2 g/dL — ABNORMAL LOW (ref 3.5–5.2)
BUN: 5 mg/dL — AB (ref 6–23)
CALCIUM: 8.7 mg/dL (ref 8.4–10.5)
CO2: 25 mmol/L (ref 19–32)
CREATININE: 0.46 mg/dL — AB (ref 0.50–1.10)
Chloride: 105 mmol/L (ref 96–112)
GFR calc Af Amer: >90 mL/min (ref >90)
GFR calc non Af Amer: >90 mL/min (ref >90)
Glucose, Bld: 86 mg/dL (ref 70–99)
POTASSIUM: 4.1 mmol/L (ref 3.5–5.1)
Sodium: 138 mmol/L (ref 135–145)
TOTAL PROTEIN: 7.1 g/dL (ref 6.0–8.3)
Total Bilirubin: 0.4 mg/dL (ref 0.3–1.2)

## 2014-10-10 LAB — URINALYSIS, ROUTINE W REFLEX MICROSCOPIC
Bilirubin Urine: NEGATIVE
Glucose, UA: NEGATIVE mg/dL
Hgb urine dipstick: NEGATIVE
Ketones, ur: >80 mg/dL — AB
NITRITE: NEGATIVE
Protein, ur: NEGATIVE mg/dL
UROBILINOGEN UA: 2 mg/dL — AB (ref 0.0–1.0)
pH: 5.5 (ref 5.0–8.0)

## 2014-10-10 LAB — URINE MICROSCOPIC-ADD ON

## 2014-10-10 MED ORDER — ONDANSETRON 4 MG PO TBDP: 4.0000 mg | ORAL_TABLET | Freq: Three times a day (TID) | ORAL | Status: DC | PRN

## 2014-10-10 MED ORDER — SODIUM CHLORIDE 0.9 % IV BOLUS (SEPSIS)
2000.0000 mL | Freq: Once | INTRAVENOUS | Status: AC
  Administered 2014-10-10: 2000 mL via INTRAVENOUS

## 2014-10-10 MED ORDER — ONDANSETRON HCL 4 MG/2ML IJ SOLN
4.0000 mg | Freq: Once | INTRAMUSCULAR | Status: AC
  Administered 2014-10-10: 4 mg via INTRAVENOUS
  Filled 2014-10-10 (×2): qty 2

## 2014-10-10 MED ORDER — FAMOTIDINE IN NACL 20-0.9 MG/50ML-% IV SOLN
20.0000 mg | Freq: Once | INTRAVENOUS | Status: AC
  Administered 2014-10-10: 20 mg via INTRAVENOUS
  Filled 2014-10-10: qty 50

## 2014-10-10 NOTE — ED Notes (Signed)
Pt states that she was dx with the flu a week ago and last night started vomiting blood and big brown chunks.  States that her pcp wanted her to be checked since she is [redacted] wks pregnant.  Vomited blood x2.
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
## 2014-10-10 NOTE — Telephone Encounter (Signed)
Received call from patient.   Reports that she has had episode of blood noted in emesis.   Advised to go to ER for evaluation.   MD made aware.

## 2014-10-10 NOTE — Discharge Instructions (Signed)

## 2014-10-10 NOTE — ED Notes (Signed)
MD James at bedside.  

## 2014-10-11 ENCOUNTER — Encounter: Payer: Self-pay | Admitting: Family Medicine

## 2014-10-11 ENCOUNTER — Ambulatory Visit (INDEPENDENT_AMBULATORY_CARE_PROVIDER_SITE_OTHER): Payer: BC Managed Care – PPO | Admitting: Family Medicine

## 2014-10-11 ENCOUNTER — Ambulatory Visit (HOSPITAL_COMMUNITY)
Admission: RE | Admit: 2014-10-11 | Discharge: 2014-10-11 | Disposition: A | Discharge status: Home / Self Care | Payer: BC Managed Care – PPO | Source: Ambulatory Visit | Attending: Family Medicine | Admitting: Family Medicine

## 2014-10-11 VITALS — BP 130/78 | HR 100 | Temp 98.2°F | Resp 18 | Ht 67.0 in | Wt 239.0 lb

## 2014-10-11 DIAGNOSIS — R300 Dysuria: Secondary | ICD-10-CM | POA: Diagnosis not present

## 2014-10-11 DIAGNOSIS — R05 Cough: Secondary | ICD-10-CM | POA: Diagnosis not present

## 2014-10-11 DIAGNOSIS — J111 Influenza due to unidentified influenza virus with other respiratory manifestations: Secondary | ICD-10-CM

## 2014-10-11 DIAGNOSIS — R059 Cough, unspecified: Secondary | ICD-10-CM

## 2014-10-11 LAB — URINALYSIS, ROUTINE W REFLEX MICROSCOPIC
Bilirubin Urine: NEGATIVE
Glucose, UA: NEGATIVE mg/dL
HGB URINE DIPSTICK: NEGATIVE
Ketones, ur: 40 mg/dL — AB
NITRITE: NEGATIVE
PH: 6.5 (ref 5.0–8.0)
PROTEIN: 100 mg/dL — AB
Specific Gravity, Urine: >1.03 — ABNORMAL HIGH (ref 1.005–1.030)
UROBILINOGEN UA: 1 mg/dL (ref 0.0–1.0)

## 2014-10-11 LAB — URINALYSIS, MICROSCOPIC ONLY
CASTS: NONE SEEN
CRYSTALS: NONE SEEN
RBC / HPF: NONE SEEN RBC/hpf (ref <3)

## 2014-10-11 NOTE — Patient Instructions (Signed)
Go get chest xray  Okay to use cough medicine  F/U pending results

## 2014-10-11 NOTE — Progress Notes (Signed)
Patient ID: Yvette Conley, female   DOB: 27-Jun-1985, 27 y.o.   MRN: [id number]   Subjective:    Patient ID: Yvette Conley, female    DOB: 27-Jun-1985, 29 y.o.   MRN: [id number]  Patient presents for ER F/U  patient a follow-up ER visit. She was seen 3 days ago and diagnosed her with influenza B she was started on Tamiflu. However 2 days later she began having cough with some mild hemoptysis states that she was coughing up chunks of blood and blood streaked sputum. She was evaluated in the ER however chest x-ray was not done. She was given IV fluids to rehydrate as she was having contractions at the time as well. Her nausea vomiting has now calmed down she did not use the Zofran that was prescribed. She still has some blood-streaked sputum and her last fever was yesterday was 101F. Her daughter is now sick with the same symptoms as well as her mother. In taking Tylenol and Robitussin for cough    Review Of Systems:  GEN- +fatigue, +fever, weight loss,weakness, recent illness HEENT- denies eye drainage, change in vision, nasal discharge, CVS- denies chest pain, palpitations RESP-+ SOB,+ cough, wheeze ABD- denies N/V, change in stools, abd pain GU- denies dysuria, hematuria, dribbling, incontinence MSK- denies joint pain,+ muscle aches, injury Neuro- denies headache, dizziness, syncope, seizure activity       Objective:    BP 130/78 mmHg  Pulse 100  Temp(Src) 98.2 F (36.8 C) (Oral)  Resp 18  Ht 5\' 7"  (1.702 m)  Wt 239 lb (108.41 kg)  BMI 37.42 kg/m2  SpO2 98%  LMP 02/28/2014 GEN- NAD, alert and oriented x3 HEENT- PERRL, EOMI, non injected sclera, pink conjunctiva, MMM, oropharynx clear Neck- Supple, no thyromegaly CVS- RRR, no murmur RESP-few rhonchi right lower chest , no wheeze, good air movement, sputum yellow in office EXT- No edema Pulses- Radial  2+        Assessment & Plan:      Problem List Items Addressed This Visit    None    Visit Diagnoses    Dysuria     -  Primary    Relevant Orders    Urinalysis, Routine w reflex microscopic (Completed)    Urine culture    Influenza        Complete Tamiflu, robitussin, CXR done- no evidence of pneumonia. Emesis has stopped, push fluids and rest. UA will send for culture, no symptoms    Relevant Orders    DG Chest 2 View (Completed)    Cough        Relevant Orders    DG Chest 2 View (Completed)       Note: This dictation was prepared with Dragon dictation along with smaller phrase technology. Any transcriptional errors that result from this process are unintentional.

## 2014-10-13 LAB — URINE CULTURE: Colony Count: 30000

## 2014-10-19 ENCOUNTER — Encounter: Payer: Self-pay | Admitting: Family Medicine

## 2014-10-19 ENCOUNTER — Ambulatory Visit (INDEPENDENT_AMBULATORY_CARE_PROVIDER_SITE_OTHER): Payer: BC Managed Care – PPO | Admitting: Family Medicine

## 2014-10-19 VITALS — BP 102/58 | HR 98 | Temp 98.0°F | Resp 16 | Wt 236.0 lb

## 2014-10-19 DIAGNOSIS — J208 Acute bronchitis due to other specified organisms: Secondary | ICD-10-CM

## 2014-10-19 LAB — CBC WITH DIFFERENTIAL/PLATELET
BASOS ABS: 0 10*3/uL (ref 0.0–0.1)
Basophils Relative: 0 % (ref 0–1)
EOS PCT: 1 % (ref 0–5)
Eosinophils Absolute: 0.1 10*3/uL (ref 0.0–0.7)
HCT: 30.7 % — ABNORMAL LOW (ref 36.0–46.0)
Hemoglobin: 10.4 g/dL — ABNORMAL LOW (ref 12.0–15.0)
LYMPHS PCT: 22 % (ref 12–46)
Lymphs Abs: 2.3 10*3/uL (ref 0.7–4.0)
MCH: 30.7 pg (ref 26.0–34.0)
MCHC: 33.9 g/dL (ref 30.0–36.0)
MCV: 90.6 fL (ref 78.0–100.0)
MONO ABS: 0.8 10*3/uL (ref 0.1–1.0)
MONOS PCT: 8 % (ref 3–12)
MPV: 9.3 fL (ref 8.6–12.4)
NEUTROS ABS: 7.1 10*3/uL (ref 1.7–7.7)
Neutrophils Relative %: 69 % (ref 43–77)
Platelets: 382 10*3/uL (ref 150–400)
RBC: 3.39 MIL/uL — ABNORMAL LOW (ref 3.87–5.11)
RDW: 13.2 % (ref 11.5–15.5)
WBC: 10.3 10*3/uL (ref 4.0–10.5)

## 2014-10-19 LAB — COMPLETE METABOLIC PANEL WITH GFR
ALK PHOS: 63 U/L (ref 39–117)
ALT: 13 U/L (ref 0–35)
AST: 11 U/L (ref 0–37)
Albumin: 3.3 g/dL — ABNORMAL LOW (ref 3.5–5.2)
BUN: 7 mg/dL (ref 6–23)
CALCIUM: 9.1 mg/dL (ref 8.4–10.5)
CHLORIDE: 103 meq/L (ref 96–112)
CO2: 24 mEq/L (ref 19–32)
Creat: 0.5 mg/dL (ref 0.50–1.10)
GFR, Est African American: >89 mL/min
GFR, Est Non African American: >89 mL/min
Glucose, Bld: 72 mg/dL (ref 70–99)
POTASSIUM: 4.8 meq/L (ref 3.5–5.3)
SODIUM: 137 meq/L (ref 135–145)
TOTAL PROTEIN: 6.4 g/dL (ref 6.0–8.3)
Total Bilirubin: 0.3 mg/dL (ref 0.2–1.2)

## 2014-10-19 MED ORDER — AZITHROMYCIN 250 MG PO TABS: ORAL_TABLET | ORAL | Status: DC
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
## 2015-01-15 ENCOUNTER — Encounter (HOSPITAL_COMMUNITY): Payer: Self-pay | Admitting: *Deleted

## 2015-01-15 ENCOUNTER — Inpatient Hospital Stay (HOSPITAL_COMMUNITY)
Admission: AD | Admit: 2015-01-15 | Discharge: 2015-01-15 | Disposition: A | Discharge status: Home / Self Care | Payer: BC Managed Care – PPO | Source: Ambulatory Visit | Attending: Obstetrics and Gynecology | Admitting: Obstetrics and Gynecology

## 2015-01-15 DIAGNOSIS — O26893 Other specified pregnancy related conditions, third trimester: Secondary | ICD-10-CM

## 2015-01-15 DIAGNOSIS — R109 Unspecified abdominal pain: Secondary | ICD-10-CM

## 2015-01-15 DIAGNOSIS — G43909 Migraine, unspecified, not intractable, without status migrainosus: Secondary | ICD-10-CM | POA: Insufficient documentation

## 2015-01-15 DIAGNOSIS — R519 Headache, unspecified: Secondary | ICD-10-CM

## 2015-01-15 DIAGNOSIS — O26899 Other specified pregnancy related conditions, unspecified trimester: Secondary | ICD-10-CM

## 2015-01-15 DIAGNOSIS — Z3A31 31 weeks gestation of pregnancy: Secondary | ICD-10-CM | POA: Insufficient documentation

## 2015-01-15 DIAGNOSIS — R51 Headache: Secondary | ICD-10-CM

## 2015-01-15 HISTORY — DX: Headache, unspecified: R51.9

## 2015-01-15 HISTORY — DX: Other specified pregnancy related conditions, third trimester: O26.893

## 2015-01-15 HISTORY — DX: Other specified pregnancy related conditions, unspecified trimester: O26.899

## 2015-01-15 HISTORY — DX: Headache: R51

## 2015-01-15 HISTORY — DX: Unspecified abdominal pain: R10.9

## 2015-01-15 LAB — COMPREHENSIVE METABOLIC PANEL
ALBUMIN: 2.8 g/dL — AB (ref 3.5–5.0)
ALT: 10 U/L — ABNORMAL LOW (ref 14–54)
AST: 14 U/L — AB (ref 15–41)
Alkaline Phosphatase: 87 U/L (ref 38–126)
Anion gap: 4 — ABNORMAL LOW (ref 5–15)
BILIRUBIN TOTAL: 0.4 mg/dL (ref 0.3–1.2)
BUN: 8 mg/dL (ref 6–20)
CO2: 20 mmol/L — AB (ref 22–32)
Calcium: 8.5 mg/dL — ABNORMAL LOW (ref 8.9–10.3)
Chloride: 110 mmol/L (ref 101–111)
Creatinine, Ser: 0.44 mg/dL (ref 0.44–1.00)
GLUCOSE: 121 mg/dL — AB (ref 65–99)
POTASSIUM: 3.5 mmol/L (ref 3.5–5.1)
Sodium: 134 mmol/L — ABNORMAL LOW (ref 135–145)
TOTAL PROTEIN: 6.5 g/dL (ref 6.5–8.1)

## 2015-01-15 LAB — CBC
HEMATOCRIT: 30.8 % — AB (ref 36.0–46.0)
HEMOGLOBIN: 10.3 g/dL — AB (ref 12.0–15.0)
MCH: 30.8 pg (ref 26.0–34.0)
MCHC: 33.4 g/dL (ref 30.0–36.0)
MCV: 92.2 fL (ref 78.0–100.0)
PLATELETS: 295 10*3/uL (ref 150–400)
RBC: 3.34 MIL/uL — ABNORMAL LOW (ref 3.87–5.11)
RDW: 13.3 % (ref 11.5–15.5)
WBC: 9.5 10*3/uL (ref 4.0–10.5)

## 2015-01-15 LAB — URINE MICROSCOPIC-ADD ON

## 2015-01-15 LAB — URINALYSIS, ROUTINE W REFLEX MICROSCOPIC
Bilirubin Urine: NEGATIVE
Glucose, UA: NEGATIVE mg/dL
Hgb urine dipstick: NEGATIVE
Ketones, ur: NEGATIVE mg/dL
Nitrite: NEGATIVE
PH: 6 (ref 5.0–8.0)
Protein, ur: NEGATIVE mg/dL
Specific Gravity, Urine: 1.025 (ref 1.005–1.030)
UROBILINOGEN UA: 0.2 mg/dL (ref 0.0–1.0)

## 2015-01-15 LAB — PROTEIN / CREATININE RATIO, URINE
Creatinine, Urine: 144 mg/dL
Protein Creatinine Ratio: 0.08 mg/mg{Cre} (ref 0.00–0.15)
Total Protein, Urine: 12 mg/dL

## 2015-01-15 LAB — LACTATE DEHYDROGENASE: LDH: 110 U/L (ref 98–192)

## 2015-01-15 LAB — URIC ACID: URIC ACID, SERUM: 3.4 mg/dL (ref 2.3–6.6)

## 2015-01-15 MED ORDER — BUTALBITAL-APAP-CAFFEINE 50-325-40 MG PO TABS: 1.0000 | ORAL_TABLET | Freq: Four times a day (QID) | ORAL | Status: AC | PRN

## 2015-01-15 NOTE — MAU Note (Signed)
Started yesterday.  Contractions were 12-38min apart.   Last night was hurting all over,pain was shooting down legs.  Again this morning, was contracting; maybe 1-2 an hour, mild.  Then would stop for a while. Been having headaches,  E.s. Tylenol isn't really helping.

## 2015-01-15 NOTE — Discharge Instructions (Signed)
Abdominal Pain During Pregnancy Abdominal pain is common in pregnancy. Most of the time, it does not cause harm. There are many causes of abdominal pain. Some causes are more serious than others. Some of the causes of abdominal pain in pregnancy are easily diagnosed. Occasionally, the diagnosis takes time to understand. Other times, the cause is not determined. Abdominal pain can be a sign that something is very wrong with the pregnancy, or the pain may have nothing to do with the pregnancy at all. For this reason, always tell your health care provider if you have any abdominal discomfort. HOME CARE INSTRUCTIONS  Monitor your abdominal pain for any changes. The following actions may help to alleviate any discomfort you are experiencing:  Do not have sexual intercourse or put anything in your vagina until your symptoms go away completely.  Get plenty of rest until your pain improves.  Drink clear fluids if you feel nauseous. Avoid solid food as long as you are uncomfortable or nauseous.  Only take over-the-counter or prescription medicine as directed by your health care provider.  Keep all follow-up appointments with your health care provider. SEEK IMMEDIATE MEDICAL CARE IF:  You are bleeding, leaking fluid, or passing tissue from the vagina.  You have increasing pain or cramping.  You have persistent vomiting.  You have painful or bloody urination.  You have a fever.  You notice a decrease in your baby's movements.  You have extreme weakness or feel faint.  You have shortness of breath, with or without abdominal pain.  You develop a severe headache with abdominal pain.  You have abnormal vaginal discharge with abdominal pain.  You have persistent diarrhea.  You have abdominal pain that continues even after rest, or gets worse. MAKE SURE YOU:   Understand these instructions.  Will watch your condition.  Will get help right away if you are not doing well or get  worse. Document Released: 06/01/2005 Document Revised: 03/22/2013 Document Reviewed: 12/29/2012 [facility] Patient Information 2015 [place], [place]. This information is not intended to replace advice given to you by your health care provider. Make sure you discuss any questions you have with your health care provider. General Headache Without Cause A headache is pain or discomfort felt around the head or neck area. The specific cause of a headache may not be found. There are many causes and types of headaches. A few common ones are:  Tension headaches.  Migraine headaches.  Cluster headaches.  Chronic daily headaches. HOME CARE INSTRUCTIONS   Keep all follow-up appointments with your caregiver or any specialist referral.  Only take over-the-counter or prescription medicines for pain or discomfort as directed by your caregiver.  Lie down in a dark, quiet room when you have a headache.  Keep a headache journal to find out what may trigger your migraine headaches. For example, write down:  What you eat and drink.  How much sleep you get.  Any change to your diet or medicines.  Try massage or other relaxation techniques.  Put ice packs or heat on the head and neck. Use these 3 to 4 times per day for 15 to 20 minutes each time, or as needed.  Limit stress.  Sit up straight, and do not tense your muscles.  Quit smoking if you smoke.  Limit alcohol use.  Decrease the amount of caffeine you drink, or stop drinking caffeine.  Eat and sleep on a regular schedule.  Get 7 to 9 hours of sleep, or as recommended by your  your caregiver. °· Keep lights dim if bright lights bother you and make your headaches worse. °SEEK MEDICAL CARE IF:  °· You have problems with the medicines you were prescribed. °· Your medicines are not working. °· You have a change from the usual headache. °· You have nausea or vomiting. °SEEK IMMEDIATE MEDICAL CARE IF:  °· Your headache becomes severe. °· You have a  fever. °· You have a stiff neck. °· You have loss of vision. °· You have muscular weakness or loss of muscle control. °· You start losing your balance or have trouble walking. °· You feel faint or pass out. °· You have severe symptoms that are different from your first symptoms. °MAKE SURE YOU:  °· Understand these instructions. °· Will watch your condition. °· Will get help right away if you are not doing well or get worse. °Document Released: 06/01/2005 Document Revised: 08/24/2011 Document Reviewed: 06/17/2011 °[employer]® Patient Information ©2015 [employer]. This information is not intended to replace advice given to you by your health care provider. Make sure you discuss any questions you have with your health care provider. ° °
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
## 2015-02-20 LAB — OB RESULTS CONSOLE GBS: GBS: NEGATIVE

## 2015-03-07 ENCOUNTER — Encounter (HOSPITAL_COMMUNITY): Payer: Self-pay | Admitting: *Deleted

## 2015-03-07 ENCOUNTER — Telehealth (HOSPITAL_COMMUNITY): Payer: Self-pay | Admitting: *Deleted

## 2015-03-07 NOTE — Telephone Encounter (Signed)
Preadmission screen  

## 2015-03-11 ENCOUNTER — Other Ambulatory Visit: Payer: Self-pay | Admitting: Obstetrics and Gynecology

## 2015-03-12 ENCOUNTER — Inpatient Hospital Stay (HOSPITAL_COMMUNITY): Payer: BC Managed Care – PPO | Admitting: Anesthesiology

## 2015-03-12 ENCOUNTER — Encounter (HOSPITAL_COMMUNITY): Payer: Self-pay

## 2015-03-12 ENCOUNTER — Inpatient Hospital Stay (HOSPITAL_COMMUNITY)
Admission: RE | Admit: 2015-03-12 | Discharge: 2015-03-14 | DRG: 775 | Disposition: A | Discharge status: Home / Self Care | Payer: BC Managed Care – PPO | Source: Ambulatory Visit | Attending: Obstetrics and Gynecology | Admitting: Obstetrics and Gynecology

## 2015-03-12 VITALS — BP 101/48 | HR 84 | Temp 97.9°F | Resp 19 | Ht 67.0 in | Wt 253.0 lb

## 2015-03-12 DIAGNOSIS — O99344 Other mental disorders complicating childbirth: Secondary | ICD-10-CM | POA: Diagnosis present

## 2015-03-12 DIAGNOSIS — R51 Headache: Secondary | ICD-10-CM | POA: Diagnosis present

## 2015-03-12 DIAGNOSIS — F419 Anxiety disorder, unspecified: Secondary | ICD-10-CM | POA: Diagnosis present

## 2015-03-12 DIAGNOSIS — Z3A00 Weeks of gestation of pregnancy not specified: Secondary | ICD-10-CM | POA: Diagnosis present

## 2015-03-12 DIAGNOSIS — O9902 Anemia complicating childbirth: Secondary | ICD-10-CM | POA: Diagnosis present

## 2015-03-12 DIAGNOSIS — O24429 Gestational diabetes mellitus in childbirth, unspecified control: Secondary | ICD-10-CM | POA: Diagnosis present

## 2015-03-12 DIAGNOSIS — O26893 Other specified pregnancy related conditions, third trimester: Secondary | ICD-10-CM

## 2015-03-12 DIAGNOSIS — Z641 Problems related to multiparity: Secondary | ICD-10-CM

## 2015-03-12 DIAGNOSIS — F329 Major depressive disorder, single episode, unspecified: Secondary | ICD-10-CM | POA: Diagnosis present

## 2015-03-12 DIAGNOSIS — O99214 Obesity complicating childbirth: Secondary | ICD-10-CM | POA: Diagnosis present

## 2015-03-12 DIAGNOSIS — R109 Unspecified abdominal pain: Secondary | ICD-10-CM

## 2015-03-12 DIAGNOSIS — O26899 Other specified pregnancy related conditions, unspecified trimester: Secondary | ICD-10-CM

## 2015-03-12 DIAGNOSIS — Z6839 Body mass index (BMI) 39.0-39.9, adult: Secondary | ICD-10-CM

## 2015-03-12 LAB — CBC
HCT: 33.2 % — ABNORMAL LOW (ref 36.0–46.0)
Hemoglobin: 11.2 g/dL — ABNORMAL LOW (ref 12.0–15.0)
MCH: 30.6 pg (ref 26.0–34.0)
MCHC: 33.7 g/dL (ref 30.0–36.0)
MCV: 90.7 fL (ref 78.0–100.0)
PLATELETS: 271 10*3/uL (ref 150–400)
RBC: 3.66 MIL/uL — ABNORMAL LOW (ref 3.87–5.11)
RDW: 13 % (ref 11.5–15.5)
WBC: 9.8 10*3/uL (ref 4.0–10.5)

## 2015-03-12 LAB — TYPE AND SCREEN
ABO/RH(D): B NEG
ANTIBODY SCREEN: NEGATIVE

## 2015-03-12 LAB — RPR: RPR: NONREACTIVE

## 2015-03-12 MED ORDER — OXYCODONE-ACETAMINOPHEN 5-325 MG PO TABS: 1.0000 | ORAL_TABLET | ORAL | Status: DC | PRN

## 2015-03-12 MED ORDER — LANOLIN HYDROUS EX OINT: TOPICAL_OINTMENT | CUTANEOUS | Status: DC | PRN

## 2015-03-12 MED ORDER — LACTATED RINGERS IV SOLN: 500.0000 mL | INTRAVENOUS | Status: DC | PRN

## 2015-03-12 MED ORDER — FENTANYL 2.5 MCG/ML BUPIVACAINE 1/10 % EPIDURAL INFUSION (WH - ANES)
14.0000 mL/h | INTRAMUSCULAR | Status: DC | PRN
  Administered 2015-03-12 (×2): 14 mL/h via EPIDURAL
  Filled 2015-03-12: qty 125

## 2015-03-12 MED ORDER — PHENYLEPHRINE 40 MCG/ML (10ML) SYRINGE FOR IV PUSH (FOR BLOOD PRESSURE SUPPORT)
80.0000 ug | PREFILLED_SYRINGE | INTRAVENOUS | Status: DC | PRN
  Filled 2015-03-12: qty 20

## 2015-03-12 MED ORDER — LACTATED RINGERS IV SOLN
INTRAVENOUS | Status: DC
  Administered 2015-03-12 (×2): via INTRAVENOUS

## 2015-03-12 MED ORDER — WITCH HAZEL-GLYCERIN EX PADS: 1.0000 "application " | MEDICATED_PAD | CUTANEOUS | Status: DC | PRN

## 2015-03-12 MED ORDER — OXYTOCIN 10 UNIT/ML IJ SOLN: 10.0000 [IU] | Freq: Once | INTRAMUSCULAR | Status: DC

## 2015-03-12 MED ORDER — OXYCODONE-ACETAMINOPHEN 5-325 MG PO TABS: 2.0000 | ORAL_TABLET | ORAL | Status: DC | PRN

## 2015-03-12 MED ORDER — PRENATAL MULTIVITAMIN CH
1.0000 | ORAL_TABLET | Freq: Every day | ORAL | Status: DC
  Administered 2015-03-13 – 2015-03-14 (×2): 1 via ORAL
  Filled 2015-03-12 (×2): qty 1

## 2015-03-12 MED ORDER — OXYTOCIN 40 UNITS IN LACTATED RINGERS INFUSION - SIMPLE MED
1.0000 m[IU]/min | INTRAVENOUS | Status: DC
  Administered 2015-03-12: 2 m[IU]/min via INTRAVENOUS
  Filled 2015-03-12: qty 1000

## 2015-03-12 MED ORDER — ONDANSETRON HCL 4 MG/2ML IJ SOLN: 4.0000 mg | Freq: Four times a day (QID) | INTRAMUSCULAR | Status: DC | PRN

## 2015-03-12 MED ORDER — MEASLES, MUMPS & RUBELLA VAC ~~LOC~~ INJ: 0.5000 mL | INJECTION | Freq: Once | SUBCUTANEOUS | Status: DC

## 2015-03-12 MED ORDER — OXYCODONE-ACETAMINOPHEN 5-325 MG PO TABS
1.0000 | ORAL_TABLET | ORAL | Status: DC | PRN
  Administered 2015-03-12 – 2015-03-13 (×2): 1 via ORAL
  Filled 2015-03-12 (×2): qty 1

## 2015-03-12 MED ORDER — SENNOSIDES-DOCUSATE SODIUM 8.6-50 MG PO TABS
2.0000 | ORAL_TABLET | ORAL | Status: DC
  Administered 2015-03-13 (×2): 2 via ORAL
  Filled 2015-03-12 (×2): qty 2

## 2015-03-12 MED ORDER — CITRIC ACID-SODIUM CITRATE 334-500 MG/5ML PO SOLN: 30.0000 mL | ORAL | Status: DC | PRN

## 2015-03-12 MED ORDER — LIDOCAINE HCL (PF) 1 % IJ SOLN
INTRAMUSCULAR | Status: DC | PRN
  Administered 2015-03-12 (×2): 4 mL

## 2015-03-12 MED ORDER — SIMETHICONE 80 MG PO CHEW: 80.0000 mg | CHEWABLE_TABLET | ORAL | Status: DC | PRN

## 2015-03-12 MED ORDER — DIPHENHYDRAMINE HCL 25 MG PO CAPS: 25.0000 mg | ORAL_CAPSULE | Freq: Four times a day (QID) | ORAL | Status: DC | PRN

## 2015-03-12 MED ORDER — ZOLPIDEM TARTRATE 5 MG PO TABS: 5.0000 mg | ORAL_TABLET | Freq: Every evening | ORAL | Status: DC | PRN

## 2015-03-12 MED ORDER — TERBUTALINE SULFATE 1 MG/ML IJ SOLN: 0.2500 mg | Freq: Once | INTRAMUSCULAR | Status: DC | PRN

## 2015-03-12 MED ORDER — ONDANSETRON HCL 4 MG PO TABS: 4.0000 mg | ORAL_TABLET | ORAL | Status: DC | PRN

## 2015-03-12 MED ORDER — OXYTOCIN 40 UNITS IN LACTATED RINGERS INFUSION - SIMPLE MED: 62.5000 mL/h | INTRAVENOUS | Status: DC

## 2015-03-12 MED ORDER — LIDOCAINE HCL (PF) 1 % IJ SOLN
30.0000 mL | INTRAMUSCULAR | Status: AC | PRN
  Administered 2015-03-12: 30 mL via SUBCUTANEOUS
  Filled 2015-03-12: qty 30

## 2015-03-12 MED ORDER — DIBUCAINE 1 % RE OINT: 1.0000 "application " | TOPICAL_OINTMENT | RECTAL | Status: DC | PRN

## 2015-03-12 MED ORDER — ACETAMINOPHEN 325 MG PO TABS: 650.0000 mg | ORAL_TABLET | ORAL | Status: DC | PRN

## 2015-03-12 MED ORDER — OXYTOCIN BOLUS FROM INFUSION
500.0000 mL | INTRAVENOUS | Status: DC
  Administered 2015-03-12: 500 mL via INTRAVENOUS

## 2015-03-12 MED ORDER — DIPHENHYDRAMINE HCL 50 MG/ML IJ SOLN: 12.5000 mg | INTRAMUSCULAR | Status: DC | PRN

## 2015-03-12 MED ORDER — INFLUENZA VAC SPLIT QUAD 0.5 ML IM SUSY
0.5000 mL | PREFILLED_SYRINGE | INTRAMUSCULAR | Status: AC
  Administered 2015-03-13: 0.5 mL via INTRAMUSCULAR

## 2015-03-12 MED ORDER — IBUPROFEN 600 MG PO TABS
600.0000 mg | ORAL_TABLET | Freq: Four times a day (QID) | ORAL | Status: DC
  Administered 2015-03-12 – 2015-03-14 (×8): 600 mg via ORAL
  Filled 2015-03-12 (×8): qty 1

## 2015-03-12 MED ORDER — EPHEDRINE 5 MG/ML INJ
10.0000 mg | INTRAVENOUS | Status: DC | PRN
Start: 2015-03-12 — End: 2015-03-12

## 2015-03-12 MED ORDER — TETANUS-DIPHTH-ACELL PERTUSSIS 5-2.5-18.5 LF-MCG/0.5 IM SUSP: 0.5000 mL | Freq: Once | INTRAMUSCULAR | Status: DC

## 2015-03-12 MED ORDER — BENZOCAINE-MENTHOL 20-0.5 % EX AERO
1.0000 "application " | INHALATION_SPRAY | CUTANEOUS | Status: DC | PRN
  Administered 2015-03-12: 1 via TOPICAL
  Filled 2015-03-12: qty 56

## 2015-03-12 MED ORDER — ONDANSETRON HCL 4 MG/2ML IJ SOLN: 4.0000 mg | INTRAMUSCULAR | Status: DC | PRN

## 2015-03-12 NOTE — Anesthesia Procedure Notes (Signed)
Epidural Patient location during procedure: OB  Staffing Anesthesiologist: JUDD, MARY Performed by: anesthesiologist   Preanesthetic Checklist Completed: patient identified, site marked, surgical consent, pre-op evaluation, timeout performed, IV checked, risks and benefits discussed and monitors and equipment checked  Epidural Patient position: sitting Prep: site prepped and draped and DuraPrep Patient monitoring: continuous pulse ox and blood pressure Approach: midline Location: L3-L4 Injection technique: LOR saline  Needle:  Needle type: Tuohy  Needle gauge: 17 G Needle length: 9 cm and 9 Needle insertion depth: 9 cm Catheter type: closed end flexible Catheter size: 19 Gauge Catheter at skin depth: 14 cm Test dose: negative  Assessment Events: blood not aspirated, injection not painful, no injection resistance, negative IV test and no paresthesia  Additional Notes Patient identified. Risks/Benefits/Options discussed with patient including but not limited to bleeding, infection, nerve damage, paralysis, failed block, incomplete pain control, headache, blood pressure changes, nausea, vomiting, reactions to medication both or allergic, itching and postpartum back pain. Confirmed with bedside nurse the patient's most recent platelet count. Confirmed with patient that they are not currently taking any anticoagulation, have any bleeding history or any family history of bleeding disorders. Patient expressed understanding and wished to proceed. All questions were answered. Sterile technique was used throughout the entire procedure. Please see nursing notes for vital signs. Test dose was given through epidural catheter and negative prior to continuing to dose epidural or start infusion. Warning signs of high block given to the patient including shortness of breath, tingling/numbness in hands, complete motor block, or any concerning symptoms with instructions to call for help. Patient was  given instructions on fall risk and not to get out of bed. All questions and concerns addressed with instructions to call with any issues or inadequate analgesia.      
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
## 2015-03-12 NOTE — Anesthesia Preprocedure Evaluation (Signed)
Anesthesia Evaluation  Patient identified by MRN, date of birth, ID band Patient awake    Reviewed: Allergy & Precautions, NPO status , Patient's Chart, lab work & pertinent test results  History of Anesthesia Complications Negative for: history of anesthetic complications  Airway Mallampati: II  TM Distance: >3 FB Neck ROM: Full    Dental no notable dental hx. (+) Dental Advisory Given   Pulmonary neg pulmonary ROS,    Pulmonary exam normal breath sounds clear to auscultation       Cardiovascular negative cardio ROS Normal cardiovascular exam Rhythm:Regular Rate:Normal     Neuro/Psych  Headaches, negative psych ROS   GI/Hepatic negative GI ROS, Neg liver ROS,   Endo/Other  diabetes, GestationalMorbid obesity  Renal/GU negative Renal ROS  negative genitourinary   Musculoskeletal negative musculoskeletal ROS (+)   Abdominal   Peds negative pediatric ROS (+)  Hematology  (+) anemia ,   Anesthesia Other Findings   Reproductive/Obstetrics (+) Pregnancy                             Anesthesia Physical Anesthesia Plan  ASA: III  Anesthesia Plan: Epidural   Post-op Pain Management:    Induction:   Airway Management Planned:   Additional Equipment:   Intra-op Plan:   Post-operative Plan:   Informed Consent: I have reviewed the patients History and Physical, chart, labs and discussed the procedure including the risks, benefits and alternatives for the proposed anesthesia with the patient or authorized representative who has indicated his/her understanding and acceptance.     Plan Discussed with: CRNA  Anesthesia Plan Comments:         Anesthesia Quick Evaluation

## 2015-03-12 NOTE — Progress Notes (Signed)
S:  Notes some ctx  O: Pitocin 16 MIU  VE . 4.5/90/-1/0 AROM clear fluid IUPC/ISE  Tracing: baseline 130 Ctx 2-3 mins  IMP: arrest of dilation due to suboptimal ctx Rh negative Term gestation P) cont with pitocin. Stadol/epidural

## 2015-03-13 LAB — CBC
HCT: 28.3 % — ABNORMAL LOW (ref 36.0–46.0)
Hemoglobin: 9.5 g/dL — ABNORMAL LOW (ref 12.0–15.0)
MCH: 30.9 pg (ref 26.0–34.0)
MCHC: 33.6 g/dL (ref 30.0–36.0)
MCV: 92.2 fL (ref 78.0–100.0)
PLATELETS: 239 10*3/uL (ref 150–400)
RBC: 3.07 MIL/uL — AB (ref 3.87–5.11)
RDW: 13.3 % (ref 11.5–15.5)
WBC: 9.1 10*3/uL (ref 4.0–10.5)

## 2015-03-13 MED ORDER — RHO D IMMUNE GLOBULIN 1500 UNIT/2ML IJ SOSY
300.0000 ug | PREFILLED_SYRINGE | Freq: Once | INTRAMUSCULAR | Status: AC
  Administered 2015-03-13: 300 ug via INTRAMUSCULAR
  Filled 2015-03-13: qty 2

## 2015-03-13 NOTE — Progress Notes (Signed)
MOB was referred for history of depression/anxiety.  Referral is screened out by Clinical Social Worker because none of the following criteria appear to apply: -History of anxiety/depression during this pregnancy, or of post-partum depression. - Diagnosis of anxiety and/or depression within last 3 years - History of depression due to pregnancy loss/loss of child or -MOB's symptoms are currently being treated with medication and/or therapy.  Depression/anixety is not identified as a current problem in prenatal records. CSW screened out referral in 2014 after previous child was born since diagnosis was not received within past 3 years. Upon chart review, no concerns noted during this pregnancy.   Please contact the Clinical Social Worker if needs arise or upon [facility] request.   Loleta Books MSW, LCSW [phone]

## 2015-03-13 NOTE — Anesthesia Postprocedure Evaluation (Signed)
  Anesthesia Post-op Note  Patient: Yvette Conley  Procedure(s) Performed: * No procedures listed *  Patient Location: Mother/Baby  Anesthesia Type:Epidural  Level of Consciousness: awake, alert  and oriented  Airway and Oxygen Therapy: Patient Spontanous Breathing  Post-op Pain: none  Post-op Assessment: Post-op Vital signs reviewed and Patient's Cardiovascular Status Stable              Post-op Vital Signs: Reviewed and stable  Last Vitals:  Filed Vitals:   03/13/15 0500  BP: 113/64  Pulse: 91  Temp:   Resp: 18    Complications: No apparent anesthesia complications

## 2015-03-14 LAB — RH IG WORKUP (INCLUDES ABO/RH)
ABO/RH(D): B NEG
FETAL SCREEN: NEGATIVE
GESTATIONAL AGE(WKS): 39
Unit division: 0

## 2015-03-14 MED ORDER — IBUPROFEN 600 MG PO TABS: 600.0000 mg | ORAL_TABLET | Freq: Four times a day (QID) | ORAL | Status: DC

## 2015-03-14 MED ORDER — OXYCODONE-ACETAMINOPHEN 5-325 MG PO TABS: 1.0000 | ORAL_TABLET | ORAL | Status: DC | PRN

## 2015-03-14 MED ORDER — HYDROCORTISONE ACE-PRAMOXINE 2.5-1 % RE CREA: 1.0000 "application " | TOPICAL_CREAM | Freq: Three times a day (TID) | RECTAL | Status: DC

## 2015-03-14 NOTE — Lactation Note (Signed)
This note was copied from the chart of Yvette Conley. Lactation Consultation Note Experienced BF mom BF her 3 yr. Old for 3-4 months. Denied difficulty other having to return to work and was difficulty pumping. Mom was BF when entered room. Baby laying in lap, body facing mom BF. Breast are HUGE pendulum w/good everted nipple. Mom states she has colostrum. Encouraged STS. Referred to Baby and Me Book in Breastfeeding section Pg. 22-23 for position options and Proper latch demonstration. Mom encouraged to feed baby 8-12 times/24 hours and with feeding cues. WH/LC brochure given w/resources, support groups and LC services. Patient Name: Yvette Conley [id number] Date: 03/14/2015 Reason for consult: Initial assessment   Maternal Data Has patient been taught Hand Expression?: Yes Does the patient have breastfeeding experience prior to this delivery?: Yes  Feeding Feeding Type: Breast Fed Length of feed: 15 min  LATCH Score/Interventions Latch: Grasps breast easily, tongue down, lips flanged, rhythmical sucking.  Audible Swallowing: A few with stimulation Intervention(s): Hand expression  Type of Nipple: Everted at rest and after stimulation  Comfort (Breast/Nipple): Soft / non-tender     Hold (Positioning): No assistance needed to correctly position infant at breast. Intervention(s): Breastfeeding basics reviewed;Support Pillows;Position options;Skin to skin  LATCH Score: 9  Lactation Tools Discussed/Used     Consult Status Consult Status: Complete    CARVER, LAURA G 03/14/2015, 7:23 AM

## 2015-03-14 NOTE — Discharge Summary (Signed)
Obstetric Discharge Summary  Reason for Admission: onset of labor Prenatal Procedures: none Intrapartum Procedures: spontaneous vaginal delivery Postpartum Procedures: none Complications-Operative and Postpartum: 2nd degree perineal laceration HEMOGLOBIN  Date Value Ref Range Status  03/13/2015 9.5* 12.0 - 15.0 g/dL Final   HCT  Date Value Ref Range Status  03/13/2015 28.3* 36.0 - 46.0 % Final    Physical Exam:  General: alert, cooperative and no distress Lochia: appropriate Uterine Fundus: firm Incision: healing well DVT Evaluation: No evidence of DVT seen on physical exam.  Discharge Diagnoses: Term Pregnancy-delivered  Discharge Information: Date: 03/14/2015 Activity: pelvic rest Diet: routine Medications: PNV, Ibuprofen, Iron and Percocet Condition: stable Instructions: refer to practice specific booklet Discharge to: home Follow-up Information    Follow up with COUSINS,SHERONETTE A, MD. Schedule an appointment as soon as possible for a visit in 6 weeks.   Specialty:  Obstetrics and Gynecology   Contact information:   [address] [place] [address] [phone]       Newborn Data: Live born female  Birth Weight: 7 lb 14.3 oz (3580 g) APGAR: 9, 9  Home with mother.  Marlinda Mike 03/14/2015, 8:52 AM

## 2015-03-14 NOTE — Progress Notes (Signed)
PPD 1 SVD  S:  Reports feeling well             Tolerating po/ No nausea or vomiting             Bleeding is light             Pain controlled with motrin and occasional percocet             Up ad lib / ambulatory / voiding QS  Newborn breast feeding  / Circumcision planned today  O:               VS: BP 101/48 mmHg  Pulse 84  Temp(Src) 97.9 F (36.6 C) (Oral)  Resp 19  Ht  (1.702 m)  Wt 114.76 kg (253 lb)  BMI 39.62 kg/m2  SpO2 100%  LMP 02/28/2014  Breastfeeding? Unknown   LABS:              Recent Labs  03/12/15 0800 03/13/15 0600  WBC 9.8 9.1  HGB 11.2* 9.5*  PLT 271 239               Blood type: --/--/B NEG (09/28 0600)  Rubella: Immune (02/16 0000)                      Physical Exam:             Alert and oriented X3  Abdomen: soft, non-tender, non-distended              Fundus: firm, non-tender, U-1  Perineum: no edema  Lochia: light  Extremities: trace edema, no calf pain or tenderness    A: PPD # 1 SVD with 2nd degree repair   Doing well - stable status  P: Routine post partum orders  DC home  Marlinda Mike CNM, MSN, [facility] 03/14/2015, 8:49 AM

## 2016-04-03 ENCOUNTER — Ambulatory Visit (HOSPITAL_COMMUNITY)
Admission: RE | Admit: 2016-04-03 | Discharge: 2016-04-03 | Disposition: A | Discharge status: Home / Self Care | Payer: BC Managed Care – PPO | Source: Ambulatory Visit | Attending: Family Medicine | Admitting: Family Medicine

## 2016-04-03 ENCOUNTER — Encounter: Payer: Self-pay | Admitting: Family Medicine

## 2016-04-03 ENCOUNTER — Ambulatory Visit (INDEPENDENT_AMBULATORY_CARE_PROVIDER_SITE_OTHER): Payer: BC Managed Care – PPO | Admitting: Family Medicine

## 2016-04-03 VITALS — BP 112/60 | HR 68 | Temp 98.2°F | Resp 12 | Ht 67.0 in | Wt 255.0 lb

## 2016-04-03 DIAGNOSIS — M5442 Lumbago with sciatica, left side: Secondary | ICD-10-CM | POA: Insufficient documentation

## 2016-04-03 DIAGNOSIS — M722 Plantar fascial fibromatosis: Secondary | ICD-10-CM | POA: Diagnosis not present

## 2016-04-03 DIAGNOSIS — M6740 Ganglion, unspecified site: Secondary | ICD-10-CM | POA: Insufficient documentation

## 2016-04-03 DIAGNOSIS — G8929 Other chronic pain: Secondary | ICD-10-CM

## 2016-04-03 DIAGNOSIS — J069 Acute upper respiratory infection, unspecified: Secondary | ICD-10-CM

## 2016-04-03 LAB — POCT PREGNANCY, URINE: PREG TEST UR: NEGATIVE

## 2016-04-03 MED ORDER — CYCLOBENZAPRINE HCL 10 MG PO TABS: 10.0000 mg | ORAL_TABLET | Freq: Three times a day (TID) | ORAL | 0 refills | Status: DC | PRN

## 2016-04-03 MED ORDER — AZITHROMYCIN 250 MG PO TABS: ORAL_TABLET | ORAL | 0 refills | Status: DC

## 2016-04-03 MED ORDER — OXYCODONE-ACETAMINOPHEN 5-325 MG PO TABS: 1.0000 | ORAL_TABLET | ORAL | 0 refills | Status: DC | PRN

## 2016-04-03 MED ORDER — DICLOFENAC SODIUM 75 MG PO TBEC: 75.0000 mg | DELAYED_RELEASE_TABLET | Freq: Two times a day (BID) | ORAL | 1 refills | Status: DC

## 2016-04-03 NOTE — Patient Instructions (Addendum)
Take muscle relaxer  Take Diclofenac twice a day  Pain pill as needed  Get the xrays done Take antibiotics if you do not improve  F/U 1 month for Physical  Bernie Covey([place])

## 2016-04-03 NOTE — Progress Notes (Signed)
Subjective:    Patient ID: Yvette Conley, female    DOB: April 03, 1986, 30 y.o.   MRN: [id number]  Patient presents for Lower Back Pain (states that intermittent pain is now becoming daily- states that she has been seen for torn tissue) and L Foot Knot (hard knot to top of left foot- states that it causes pain intermittently)  Patient here with low back pain she has history of chronic back pain has had a torn muscle on the left side in the past. Most of her pain is still situated on the left side she had some complications after her pregnancy which she did physical therapy to help with adjustment in her spine and her hips. She is also seen chiropractor in the past for adjustments. She has been treated with muscle relaxers and anti-inflammatories as well. The past couple months she's had increasing pain pain with sitting she also gets radiation down her left leg. 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 She denies any change in bowel or bladder. She has been taking over-the-counter medications with minimal improvement. She also notices swelling on the top part of her left foot that is been present for at least 6 months it has not changed in size. She does take chronic foot pain as well she has history of plantar fasciitis.   Also has sore throat, no fever, sinus drainage, mild cough, both her children have strep throat. No OTC meds taken yet  Review Of Systems:  GEN- denies fatigue, fever, weight loss,weakness, recent illness HEENT- denies eye drainage, change in vision,+ nasal discharge, CVS- denies chest pain, palpitations RESP- denies SOB,+ cough, wheeze ABD- denies N/V, change in stools, abd pain GU- denies dysuria, hematuria, dribbling, incontinence MSK-+ joint pain, muscle aches, injury Neuro- denies headache, dizziness, syncope, seizure activity       Objective:    BP 112/60 (BP Location: Left  Arm, Patient Position: Sitting, Cuff Size: Large)   Pulse 68   Temp 98.2 F (36.8 C) (Oral)   Resp 12   Ht 5\' 7"  (1.702 m)   Wt 255 lb (115.7 kg)   LMP 03/04/2016 Comment: irregular  BMI 39.94 kg/m  GEN- NAD, alert and oriented x3 HEENT- PERRL, EOMI, non injected sclera, pink conjunctiva, MMM, oropharynx mild injection, no exudates, TM CLEAR BILAT no effusion, nares clear rhinorrhea, no maxillary sinus tenderness  Neck- Supple, no thyromegaly, few submandibular shotty nodes  CVS- RRR, no murmur RESP-CTAB MSK- TTP Lumbar spine and left paraspinals,decreased ROM, +spasm, neg SLR EXT- No edema, slighlty mobile nickle size  knot over left hindfoot, mild TTP, no fluctance Neuro- sensation grossly in tact, DTR symmetric LE, motor equal bilat,antalgic gait  Pulses- Radial, DP- 2+         Assessment & Plan:      Problem List Items Addressed This Visit    None    Visit Diagnoses    Ganglion cyst    -  Primary   concern for cyst, obtain xray of foot, podiatry referral pending results   Relevant Orders   DG Foot Complete Left (Completed)   Plantar fasciitis, bilateral       chronic pain, podiatry referral   Chronic bilateral low back pain with left-sided sciatica       obtain xray of spine, chronic duration > 6 months of pain. given flexeril, NSAID, norco short term.  Relevant Medications   Aspirin-Acetaminophen (GOODY BODY PAIN) 500-325 MG PACK   oxyCODONE-acetaminophen (PERCOCET/ROXICET) 5-325 MG tablet   cyclobenzaprine (FLEXERIL) 10 MG tablet   diclofenac (VOLTAREN) 75 MG EC tablet   Other Relevant Orders   DG Lumbar Spine Complete (Completed)   Acute URI       Multiple symptoms, likley viral but has positive strep in  home, given zpak in case she worsens, gargle, OTC cough med   Relevant Medications   azithromycin (ZITHROMAX) 250 MG tablet      Note: This dictation was prepared with Dragon dictation along with smaller phrase technology. Any transcriptional errors that  result from this process are unintentional.

## 2016-04-05 ENCOUNTER — Encounter: Payer: Self-pay | Admitting: Family Medicine

## 2016-04-13 ENCOUNTER — Ambulatory Visit (INDEPENDENT_AMBULATORY_CARE_PROVIDER_SITE_OTHER): Payer: BC Managed Care – PPO | Admitting: Family Medicine

## 2016-04-13 ENCOUNTER — Encounter: Payer: Self-pay | Admitting: Family Medicine

## 2016-04-13 VITALS — BP 102/68 | HR 98 | Temp 97.8°F | Resp 14 | Ht 67.0 in | Wt 255.0 lb

## 2016-04-13 DIAGNOSIS — R42 Dizziness and giddiness: Secondary | ICD-10-CM

## 2016-04-13 MED ORDER — MECLIZINE HCL 12.5 MG PO TABS: 12.5000 mg | ORAL_TABLET | Freq: Three times a day (TID) | ORAL | 0 refills | Status: DC | PRN

## 2016-04-13 NOTE — Patient Instructions (Addendum)
Try the meclizine Call if this persist  or worsens and MRI of brain to be done  F/U as needed

## 2016-04-13 NOTE — Progress Notes (Signed)
   Subjective:    Patient ID: Yvette Conley, female    DOB: [birth year], 30 y.o.   MRN: [id number]  Patient presents for Dizziness (x3- 4 weeks-  states that she has intermittent episodes of loss of balance, but now has noted some dizziness with episode)    Intermittent episodes of dizziness and feeling off balance for the past 4 weeks. The symptoms that she started prior to her coming in for her back pain but that was more precedent at that time. She's had a couple episodes where she is standing and feels like she is tipping to the side both directions on one occasion she actually felt like she was going backwards. She denies any headache change in vision associated. If they worsen when she had her upper respiratory infection. 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 She denies any headache change in vision nausea vomiting associated with the episodes. They're very short-lived. Typically she just sits down for a moment and then it resolves. She does have family history of vertigo on her father's side. She states that she did have episodes like this in the past and at that time was on multiple medications to control her chronic back pain  She is now only using the Flexeril as needed and is taking the anti-inflammatory once a day the past few days she has not taken the hydrocodone.  Review Of Systems:  GEN- denies fatigue, fever, weight loss,weakness, recent illness HEENT- denies eye drainage, change in vision, nasal discharge, CVS- denies chest pain, palpitations RESP- denies SOB, cough, wheeze ABD- denies N/V, change in stools, abd pain GU- denies dysuria, hematuria, dribbling, incontinence MSK- denies joint pain, muscle aches, injury Neuro- denies headache,+ dizziness, syncope, seizure activity        Objective:    BP 102/68 (BP Location: Left Arm, Patient Position: Sitting, Cuff Size: Large)   Pulse 98   Temp 97.8 F (36.6 C) (Oral)   Resp 14   Ht 5\' 7"  (1.702 m)   Wt 255 lb (115.7 kg)   LMP 03/04/2016 Comment: irregular  SpO2 99%   BMI 39.94 kg/m  GEN- NAD, alert and oriented x3 HEENT- PERRL, EOMI, non injected sclera, pink conjunctiva, MMM, oropharynx clear Neck- Supple, no thyromegaly , no bruit CVS- RRR, no murmur RESP-CTAB Neuro-CNII-XII intact, neg rhomberg, normal finger to nose, normal alternating hands, neg epley attempt  EXT- No edema Pulses- Radial 2+        Assessment & Plan:      Problem List Items Addressed This Visit    None    Visit Diagnoses    Dizziness and giddiness    -  Primary   Very intermittant and vague symptoms, normal neurological exam. Given meclizine in case symptoms return to try, hold off on MRI unless this progresses. She had worsening symptoms when she was sick but now that has resolved. Possible Inner ear problem    Relevant Medications   meclizine (ANTIVERT) 12.5 MG tablet      Note: This dictation was prepared with Dragon dictation along with smaller phrase technology. Any transcriptional errors that result from this process are unintentional.

## 2016-05-11 ENCOUNTER — Encounter: Payer: BC Managed Care – PPO | Admitting: Family Medicine

## 2016-05-20 ENCOUNTER — Other Ambulatory Visit: Payer: Self-pay | Admitting: Podiatry

## 2016-05-21 NOTE — Patient Instructions (Signed)
    Pryor Montesleshia B Bennick  05/21/2016      [facility] [phone] - [place], [place] - [address] [place] [id number] Phone: [phone] Fax: [phone]

## 2016-05-21 NOTE — Patient Instructions (Signed)
Yvette Conley  05/21/2016     @PREFPERIOPPHARMACY @   Your procedure is scheduled on 05/27/2016.  [facility] at 7:00 A.M.  Call this number if you have problems the morning of surgery:  [phone]   Remember:  Do not eat food or drink liquids after midnight.  Take these medicines the morning of surgery with A SIP OF WATER :  Flexeril, Antivert and Percocet   Do not wear jewelry, make-up or nail polish.  Do not wear lotions, powders, or perfumes, or deoderant.  Do not shave 48 hours prior to surgery.  Men may shave face and neck.  Do not bring valuables to the hospital.  [facility] is not responsible for any belongings or valuables.  Contacts, dentures or bridgework may not be worn into surgery.  Leave your suitcase in the car.  After surgery it may be brought to your room.  For patients admitted to the hospital, discharge time will be determined by your treatment team.  Patients discharged the day of surgery will not be allowed to drive home.   Name and phone number of your driver:   family Special instructions:  n/a   General Anesthesia, Adult General anesthesia is the use of medicines to make a person "go to sleep" (be unconscious) for a medical procedure. General anesthesia is often recommended when a procedure:  Is long.  Requires you to be still or in an unusual position.  Is major and can cause you to lose blood.  Is impossible to do without general anesthesia. The medicines used for general anesthesia are called general anesthetics. In addition to making you sleep, the medicines:  Prevent pain.  Control your blood pressure.  Relax your muscles. Tell a health care provider about:  Any allergies you have.  All medicines you are taking, including vitamins, herbs, eye drops, creams, and over-the-counter medicines.  Any problems you or family members have had with anesthetic medicines.  Types of anesthetics you have had in the past.  Any  bleeding disorders you have.  Any surgeries you have had.  Any medical conditions you have.  Any history of heart or lung conditions, such as heart failure, sleep apnea, or chronic obstructive pulmonary disease (COPD).  Whether you are pregnant or may be pregnant.  Whether you use tobacco, alcohol, marijuana, or street drugs.  Any history of Financial plannermilitary service.  Any history of depression or anxiety. What are the risks? Generally, this is a safe procedure. However, problems may occur, including:  Allergic reaction to anesthetics.  Lung and heart problems.  Inhaling food or liquids from your stomach into your lungs (aspiration).  Injury to nerves.  Waking up during your procedure and being unable to move (rare).  Extreme agitation or a state of mental confusion (delirium) when you wake up from the anesthetic.  Air in the bloodstream, which can lead to stroke. These problems are more likely to develop if you are having a major surgery or if you have an advanced medical condition. You can prevent some of these complications by answering all of your health care provider's questions thoroughly and by following all pre-procedure instructions. General anesthesia can cause side effects, including:  Nausea or vomiting  A sore throat from the breathing tube.  Feeling cold or shivery.  Feeling tired, washed out, or achy.  Sleepiness or drowsiness.  Confusion or agitation. What happens before the procedure? Staying hydrated  Follow instructions from your health care provider about hydration, which may  include:  Up to 2 hours before the procedure - you may continue to drink clear liquids, such as water, clear fruit juice, black coffee, and plain tea. Eating and drinking restrictions  Follow instructions from your health care provider about eating and drinking, which may include:  8 hours before the procedure - stop eating heavy meals or foods such as meat, fried foods, or fatty  foods.  6 hours before the procedure - stop eating light meals or foods, such as toast or cereal.  6 hours before the procedure - stop drinking milk or drinks that contain milk.  2 hours before the procedure - stop drinking clear liquids. Medicines  Ask your health care provider about:  Changing or stopping your regular medicines. This is especially important if you are taking diabetes medicines or blood thinners.  Taking medicines such as aspirin and ibuprofen. These medicines can thin your blood. Do not take these medicines before your procedure if your health care provider instructs you not to.  Taking new dietary supplements or medicines. Do not take these during the week before your procedure unless your health care provider approves them.  If you are told to take a medicine or to continue taking a medicine on the day of the procedure, take the medicine with sips of water. General instructions   Ask if you will be going home the same day, the following day, or after a longer hospital stay.  Plan to have someone take you home.  Plan to have someone stay with you for the first 24 hours after you leave the hospital or clinic.  For 3-6 weeks before the procedure, try not to use any tobacco products, such as cigarettes, chewing tobacco, and e-cigarettes.  You may brush your teeth on the morning of the procedure, but make sure to spit out the toothpaste. What happens during the procedure?  You will be given anesthetics through a mask and through an IV tube in one of your veins.  You may receive medicine to help you relax (sedative).  As soon as you are asleep, a breathing tube may be used to help you breathe.  An anesthesia specialist will stay with you throughout the procedure. He or she will help keep you comfortable and safe by continuing to give you medicines and adjusting the amount of medicine that you get. He or she will also watch your blood pressure, pulse, and oxygen  levels to make sure that the anesthetics do not cause any problems.  If a breathing tube was used to help you breathe, it will be removed before you wake up. The procedure may vary among health care providers and hospitals. What happens after the procedure?  You will wake up, often slowly, after the procedure is complete, usually in a recovery area.  Your blood pressure, heart rate, breathing rate, and blood oxygen level will be monitored until the medicines you were given have worn off.  You may be given medicine to help you calm down if you feel anxious or agitated.  If you will be going home the same day, your health care provider may check to make sure you can stand, drink, and urinate.  Your health care providers will treat your pain and side effects before you go home.  Do not drive for 24 hours if you received a sedative.  You may:  Feel nauseous and vomit.  Have a sore throat.  Have mental slowness.  Feel cold or shivery.  Feel sleepy.  Feel  tired.  Feel sore or achy, even in parts of your body where you did not have surgery. This information is not intended to replace advice given to you by your health care provider. Make sure you discuss any questions you have with your health care provider. Document Released: 09/08/2007 Document Revised: 11/12/2015 Document Reviewed: 05/16/2015 Elsevier Interactive Patient Education  2017 [employer]

## 2016-05-22 ENCOUNTER — Encounter (HOSPITAL_COMMUNITY): Payer: Self-pay

## 2016-05-22 ENCOUNTER — Encounter (HOSPITAL_COMMUNITY)
Admission: RE | Admit: 2016-05-22 | Discharge: 2016-05-22 | Disposition: A | Discharge status: Home / Self Care | Payer: BC Managed Care – PPO | Source: Ambulatory Visit | Attending: Podiatry | Admitting: Podiatry

## 2016-05-22 DIAGNOSIS — Z01812 Encounter for preprocedural laboratory examination: Secondary | ICD-10-CM | POA: Diagnosis present

## 2016-05-22 DIAGNOSIS — M67472 Ganglion, left ankle and foot: Secondary | ICD-10-CM | POA: Insufficient documentation

## 2016-05-22 LAB — CBC
HCT: 35.8 % — ABNORMAL LOW (ref 36.0–46.0)
HEMOGLOBIN: 12 g/dL (ref 12.0–15.0)
MCH: 30.5 pg (ref 26.0–34.0)
MCHC: 33.5 g/dL (ref 30.0–36.0)
MCV: 91.1 fL (ref 78.0–100.0)
Platelets: 351 10*3/uL (ref 150–400)
RBC: 3.93 MIL/uL (ref 3.87–5.11)
RDW: 12.2 % (ref 11.5–15.5)
WBC: 9.7 10*3/uL (ref 4.0–10.5)

## 2016-05-22 LAB — BASIC METABOLIC PANEL
Anion gap: 10 (ref 5–15)
BUN: 9 mg/dL (ref 6–20)
CHLORIDE: 100 mmol/L — AB (ref 101–111)
CO2: 23 mmol/L (ref 22–32)
Calcium: 8.9 mg/dL (ref 8.9–10.3)
Creatinine, Ser: 0.61 mg/dL (ref 0.44–1.00)
GFR calc non Af Amer: >60 mL/min (ref >60)
Glucose, Bld: 94 mg/dL (ref 65–99)
POTASSIUM: 3.5 mmol/L (ref 3.5–5.1)
SODIUM: 133 mmol/L — AB (ref 135–145)

## 2016-05-22 LAB — HCG, SERUM, QUALITATIVE: PREG SERUM: NEGATIVE

## 2016-05-27 ENCOUNTER — Encounter (HOSPITAL_COMMUNITY)
Admission: RE | Disposition: A | Discharge status: Home / Self Care | Payer: Self-pay | Source: Ambulatory Visit | Attending: Podiatry

## 2016-05-27 ENCOUNTER — Ambulatory Visit (HOSPITAL_COMMUNITY): Payer: BC Managed Care – PPO

## 2016-05-27 ENCOUNTER — Ambulatory Visit (HOSPITAL_COMMUNITY): Payer: BC Managed Care – PPO | Admitting: Anesthesiology

## 2016-05-27 ENCOUNTER — Ambulatory Visit (HOSPITAL_COMMUNITY)
Admission: RE | Admit: 2016-05-27 | Discharge: 2016-05-27 | Disposition: A | Discharge status: Home / Self Care | Payer: BC Managed Care – PPO | Source: Ambulatory Visit | Attending: Podiatry | Admitting: Podiatry

## 2016-05-27 DIAGNOSIS — M67472 Ganglion, left ankle and foot: Secondary | ICD-10-CM | POA: Insufficient documentation

## 2016-05-27 DIAGNOSIS — M899 Disorder of bone, unspecified: Secondary | ICD-10-CM | POA: Insufficient documentation

## 2016-05-27 DIAGNOSIS — F419 Anxiety disorder, unspecified: Secondary | ICD-10-CM | POA: Diagnosis not present

## 2016-05-27 DIAGNOSIS — Z9889 Other specified postprocedural states: Secondary | ICD-10-CM

## 2016-05-27 HISTORY — PX: GANGLION CYST EXCISION: SHX1691

## 2016-05-27 SURGERY — EXCISION, GANGLION CYST, FOOT
Anesthesia: Monitor Anesthesia Care | Site: Foot | Laterality: Left

## 2016-05-27 MED ORDER — CEFAZOLIN SODIUM-DEXTROSE 2-4 GM/100ML-% IV SOLN
2.0000 g | INTRAVENOUS | Status: AC
  Administered 2016-05-27: 2 g via INTRAVENOUS
  Filled 2016-05-27: qty 100

## 2016-05-27 MED ORDER — LACTATED RINGERS IV SOLN
INTRAVENOUS | Status: DC
  Administered 2016-05-27: 1000 mL via INTRAVENOUS

## 2016-05-27 MED ORDER — FENTANYL CITRATE (PF) 100 MCG/2ML IJ SOLN
INTRAMUSCULAR | Status: DC | PRN
  Administered 2016-05-27: 12.5 ug via INTRAVENOUS
  Administered 2016-05-27: 25 ug via INTRAVENOUS
  Administered 2016-05-27 (×2): 12.5 ug via INTRAVENOUS
  Administered 2016-05-27: 25 ug via INTRAVENOUS
  Administered 2016-05-27: 12.5 ug via INTRAVENOUS

## 2016-05-27 MED ORDER — ONDANSETRON HCL 4 MG/2ML IJ SOLN
4.0000 mg | Freq: Once | INTRAMUSCULAR | Status: AC
  Administered 2016-05-27: 4 mg via INTRAVENOUS

## 2016-05-27 MED ORDER — MIDAZOLAM HCL 2 MG/2ML IJ SOLN
1.0000 mg | INTRAMUSCULAR | Status: DC | PRN
Start: 2016-05-27 — End: 2016-05-27
  Administered 2016-05-27: 2 mg via INTRAVENOUS

## 2016-05-27 MED ORDER — PROPOFOL 10 MG/ML IV BOLUS
INTRAVENOUS | Status: AC
  Filled 2016-05-27: qty 40

## 2016-05-27 MED ORDER — FENTANYL CITRATE (PF) 100 MCG/2ML IJ SOLN
INTRAMUSCULAR | Status: AC
Start: 2016-05-27 — End: 2016-05-27
  Filled 2016-05-27: qty 2

## 2016-05-27 MED ORDER — FENTANYL CITRATE (PF) 100 MCG/2ML IJ SOLN
INTRAMUSCULAR | Status: AC
  Filled 2016-05-27: qty 2

## 2016-05-27 MED ORDER — MIDAZOLAM HCL 5 MG/5ML IJ SOLN
INTRAMUSCULAR | Status: DC | PRN
  Administered 2016-05-27 (×3): 0.5 mg via INTRAVENOUS
  Administered 2016-05-27: .5 mg via INTRAVENOUS

## 2016-05-27 MED ORDER — ONDANSETRON HCL 4 MG/2ML IJ SOLN
INTRAMUSCULAR | Status: AC
  Filled 2016-05-27: qty 2

## 2016-05-27 MED ORDER — MIDAZOLAM HCL 2 MG/2ML IJ SOLN
INTRAMUSCULAR | Status: AC
  Filled 2016-05-27: qty 2

## 2016-05-27 MED ORDER — CHLORHEXIDINE GLUCONATE CLOTH 2 % EX PADS: 6.0000 | MEDICATED_PAD | Freq: Once | CUTANEOUS | Status: DC

## 2016-05-27 MED ORDER — DEXAMETHASONE SODIUM PHOSPHATE 4 MG/ML IJ SOLN
INTRAMUSCULAR | Status: AC
  Filled 2016-05-27: qty 1

## 2016-05-27 MED ORDER — LIDOCAINE HCL (PF) 1 % IJ SOLN
INTRAMUSCULAR | Status: AC
  Filled 2016-05-27: qty 30

## 2016-05-27 MED ORDER — PROPOFOL 500 MG/50ML IV EMUL
INTRAVENOUS | Status: DC | PRN
  Administered 2016-05-27: 100 ug/kg/min via INTRAVENOUS
  Administered 2016-05-27: 50 ug/kg/min via INTRAVENOUS

## 2016-05-27 MED ORDER — PROPOFOL 10 MG/ML IV BOLUS
INTRAVENOUS | Status: DC | PRN
  Administered 2016-05-27: 20 mg via INTRAVENOUS
  Administered 2016-05-27: 40 mg via INTRAVENOUS
  Administered 2016-05-27: 20 mg via INTRAVENOUS

## 2016-05-27 MED ORDER — DEXAMETHASONE SODIUM PHOSPHATE 4 MG/ML IJ SOLN
4.0000 mg | INTRAMUSCULAR | Status: AC
  Administered 2016-05-27: 4 mg via INTRAVENOUS

## 2016-05-27 MED ORDER — FENTANYL CITRATE (PF) 100 MCG/2ML IJ SOLN: 25.0000 ug | INTRAMUSCULAR | Status: DC | PRN

## 2016-05-27 MED ORDER — 0.9 % SODIUM CHLORIDE (POUR BTL) OPTIME
TOPICAL | Status: DC | PRN
  Administered 2016-05-27: 1000 mL

## 2016-05-27 MED ORDER — LIDOCAINE HCL 1 % IJ SOLN
INTRAMUSCULAR | Status: DC | PRN
  Administered 2016-05-27: 5 mL

## 2016-05-27 MED ORDER — BUPIVACAINE HCL (PF) 0.5 % IJ SOLN
INTRAMUSCULAR | Status: AC
Start: 2016-05-27 — End: 2016-05-27
  Filled 2016-05-27: qty 30

## 2016-05-27 MED ORDER — LIDOCAINE HCL (CARDIAC) 10 MG/ML IV SOLN
INTRAVENOUS | Status: DC | PRN
  Administered 2016-05-27: 50 mg via INTRAVENOUS

## 2016-05-27 MED ORDER — FENTANYL CITRATE (PF) 100 MCG/2ML IJ SOLN
25.0000 ug | INTRAMUSCULAR | Status: AC | PRN
  Administered 2016-05-27 (×2): 25 ug via INTRAVENOUS

## 2016-05-27 MED ORDER — LIDOCAINE HCL (PF) 1 % IJ SOLN
INTRAMUSCULAR | Status: AC
  Filled 2016-05-27: qty 5

## 2016-05-27 MED ORDER — PROPOFOL 10 MG/ML IV BOLUS
INTRAVENOUS | Status: AC
  Filled 2016-05-27: qty 20

## 2016-05-27 SURGICAL SUPPLY — 44 items
BAG HAMPER (MISCELLANEOUS) ×3 IMPLANT
BANDAGE ELASTIC 4 LF NS (GAUZE/BANDAGES/DRESSINGS) ×3 IMPLANT
BANDAGE ELASTIC 4 VELCRO NS (GAUZE/BANDAGES/DRESSINGS) ×3 IMPLANT
BANDAGE ESMARK 4X12 BL STRL LF (DISPOSABLE) ×1 IMPLANT
BENZOIN TINCTURE PRP APPL 2/3 (GAUZE/BANDAGES/DRESSINGS) ×3 IMPLANT
BLADE SURG 15 STRL LF DISP TIS (BLADE) ×1 IMPLANT
BLADE SURG 15 STRL SS (BLADE) ×2
BNDG CONFORM 2 STRL LF (GAUZE/BANDAGES/DRESSINGS) ×3 IMPLANT
BNDG ESMARK 4X12 BLUE STRL LF (DISPOSABLE) ×3
BNDG GAUZE ELAST 4 BULKY (GAUZE/BANDAGES/DRESSINGS) ×3 IMPLANT
CHLORAPREP W/TINT 26ML (MISCELLANEOUS) ×3 IMPLANT
CLOSURE WOUND 1/2 X4 (GAUZE/BANDAGES/DRESSINGS) ×1
CLOTH BEACON ORANGE TIMEOUT ST (SAFETY) ×3 IMPLANT
COVER LIGHT HANDLE STERIS (MISCELLANEOUS) ×6 IMPLANT
CUFF TOURNIQUET SINGLE 18IN (TOURNIQUET CUFF) ×3 IMPLANT
DECANTER SPIKE VIAL GLASS SM (MISCELLANEOUS) ×6 IMPLANT
DRSG ADAPTIC 3X8 NADH LF (GAUZE/BANDAGES/DRESSINGS) ×3 IMPLANT
ELECT REM PT RETURN 9FT ADLT (ELECTROSURGICAL) ×3
ELECTRODE REM PT RTRN 9FT ADLT (ELECTROSURGICAL) ×1 IMPLANT
FORMALIN 10 PREFIL 120ML (MISCELLANEOUS) ×3 IMPLANT
GAUZE SPONGE 4X4 12PLY STRL (GAUZE/BANDAGES/DRESSINGS) ×3 IMPLANT
GLOVE BIOGEL PI IND STRL 6.5 (GLOVE) ×1 IMPLANT
GLOVE BIOGEL PI IND STRL 7.0 (GLOVE) ×2 IMPLANT
GLOVE BIOGEL PI IND STRL 7.5 (GLOVE) ×1 IMPLANT
GLOVE BIOGEL PI INDICATOR 6.5 (GLOVE) ×2
GLOVE BIOGEL PI INDICATOR 7.0 (GLOVE) ×4
GLOVE BIOGEL PI INDICATOR 7.5 (GLOVE) ×2
GLOVE EXAM NITRILE MD LF STRL (GLOVE) ×3 IMPLANT
GLOVE SURG SS PI 6.5 STRL IVOR (GLOVE) ×3 IMPLANT
GLOVE SURG SS PI 7.0 STRL IVOR (GLOVE) ×6 IMPLANT
GLOVE SURG SS PI 7.5 STRL IVOR (GLOVE) ×3 IMPLANT
GOWN STRL REUS W/ TWL LRG LVL3 (GOWN DISPOSABLE) ×1 IMPLANT
GOWN STRL REUS W/TWL LRG LVL3 (GOWN DISPOSABLE) ×8 IMPLANT
KIT ROOM TURNOVER AP CYSTO (KITS) ×3 IMPLANT
MANIFOLD NEPTUNE II (INSTRUMENTS) ×3 IMPLANT
NS IRRIG 1000ML POUR BTL (IV SOLUTION) ×3 IMPLANT
PACK BASIC LIMB (CUSTOM PROCEDURE TRAY) ×3 IMPLANT
PAD ARMBOARD 7.5X6 YLW CONV (MISCELLANEOUS) ×3 IMPLANT
SET BASIN LINEN APH (SET/KITS/TRAYS/PACK) ×3 IMPLANT
SPONGE LAP 18X18 X RAY DECT (DISPOSABLE) ×3 IMPLANT
STRIP CLOSURE SKIN 1/2X4 (GAUZE/BANDAGES/DRESSINGS) ×2 IMPLANT
SUT PROLENE 4 0 PS 2 18 (SUTURE) ×3 IMPLANT
SUT VICRYL AB 3-0 FS1 BRD 27IN (SUTURE) ×3 IMPLANT
SYR CONTROL 10ML LL (SYRINGE) ×3 IMPLANT

## 2016-05-27 NOTE — Brief Op Note (Signed)
05/27/2016  9:40 AM  PATIENT:  Yvette Conley  30 y.o. female  PRE-OPERATIVE DIAGNOSIS:  left foot ganglion cyst  POST-OPERATIVE DIAGNOSIS:  left foot ganglion cyst Exostosis left foot  PROCEDURE:  Procedure(s): EXCISION AND REMOVAL GANGLION CYST LEFT FOOT (Left) Removal of exostosis left foot  SURGEON:  Surgeon(s) and Role:    * Erskine EmeryPrayashkumar Liticia Gasior, DPM - Primary    * Ferman HammingBenjamin McKinney, DPM - Assisting   ANESTHESIA:   MAC  EBL:  Total I/O In: 400 [I.V.:400] Out: 0   BLOOD ADMINISTERED:none  DRAINS: none   LOCAL MEDICATIONS USED:  MARCAINE   , LIDOCAINE  and Amount: 5 ml  SPECIMEN:  Excision  DISPOSITION OF SPECIMEN:  PATHOLOGY  COUNTS:  YES  TOURNIQUET:   Total Tourniquet Time Documented: Leg (Left) - 33 minutes Total: Leg (Left) - 33 minutes   DICTATION: .Reubin Milanragon Dictation  PLAN OF CARE: Discharge to home after PACU  PATIENT DISPOSITION:  PACU - hemodynamically stable.   Delay start of Pharmacological VTE agent (>24hrs) due to surgical blood loss or risk of bleeding: not applicable

## 2016-05-27 NOTE — Discharge Instructions (Signed)

## 2016-05-27 NOTE — Transfer of Care (Signed)
Immediate Anesthesia Transfer of Care Note  Patient: Yvette Conley  Procedure(s) Performed: Procedure(s): EXCISION AND REMOVAL GANGLION CYST LEFT FOOT (Left)  Patient Location: PACU  Anesthesia Type:MAC  Level of Consciousness: awake and patient cooperative  Airway & Oxygen Therapy: Patient Spontanous Breathing  Post-op Assessment: Report given to RN and Post -op Vital signs reviewed and stable  Post vital signs: Reviewed and stable  Last Vitals:  Vitals:   05/27/16 0820 05/27/16 0825  BP: 112/72 111/75  Resp: 15 12  Temp:      Last Pain: There were no vitals filed for this visit.       Complications: No apparent anesthesia complications

## 2016-05-27 NOTE — Anesthesia Postprocedure Evaluation (Signed)
Anesthesia Post Note  Patient: Yvette Conley  Procedure(s) Performed: Procedure(s) (LRB): EXCISION AND REMOVAL GANGLION CYST LEFT FOOT (Left)  Patient location during evaluation: PACU Anesthesia Type: MAC Level of consciousness: awake Pain management: pain level not controlled Vital Signs Assessment: post-procedure vital signs reviewed and stable Respiratory status: spontaneous breathing Cardiovascular status: stable Anesthetic complications: no    Last Vitals:  Vitals:   05/27/16 0940 05/27/16 0945  BP: 104/73 105/64  Pulse: 87 79  Resp: 19 17  Temp:      Last Pain: There were no vitals filed for this visit.               Minerva AreolaYATES,Hyrum Shaneyfelt

## 2016-05-27 NOTE — Op Note (Signed)
.  OPERATIVE NOTE  DATE OF PROCEDURE 05/27/2016 PATIENT:  Yvette Conley  30 y.o. female  PRE-OPERATIVE DIAGNOSIS:  left foot ganglion cyst  POST-OPERATIVE DIAGNOSIS:  left foot ganglion cyst Exostosis left foot  PROCEDURE:  Procedure(s): EXCISION AND REMOVAL GANGLION CYST LEFT FOOT (Left) Removal of exostosis left foot  SURGEON:  Surgeon(s) and Role:    * Erskine EmeryPrayashkumar Militza Devery, DPM - Primary    * Ferman HammingBenjamin McKinney, DPM - Assisting   ANESTHESIA:   MAC  EBL:  Total I/O In: 400 [I.V.:400] Out: 0   BLOOD ADMINISTERED:none  DRAINS: none   LOCAL MEDICATIONS USED:  MARCAINE   , LIDOCAINE  and Amount: 5 ml  SPECIMEN:  Excision soft tissue mass  DISPOSITION OF SPECIMEN:  PATHOLOGY  COUNTS:  YES  TOURNIQUET:   Total Tourniquet Time Documented: Leg (Left) - 33 minutes Total: Leg (Left) - 33 minutes   DICTATION: .Reubin Milanragon Dictation  PLAN OF CARE: Discharge to home after PACU  PATIENT DISPOSITION:  PACU - hemodynamically stable.   Delay start of Pharmacological VTE agent (>24hrs) due to surgical blood loss or risk of bleeding: not applicable   DESCRIPTION OF THE PROCEDURE:  The patient was brought to the operating room and placed on the operative table in the supine position.  A pneumatic ankle tourniquet was applied to the operative extremity.  Following sedation, the surgical site was anesthetized with mixture of 1%plain lidocaine with  0.5% Marcaine plain total of 5cc.  The foot was then prepped, scrubbed, and draped in the usual sterile technique.  The foot was elevated for 3 minutes and the pneumatic ankle tourniquet inflated to 250 mmHg.    Attention was directed over the dorsum of the left foot. Soft tissue mas was felt over navicular cuneiform joint, lateral to the neurovascular bundle and extensor hallucis longus tendon. A 2.5 cm incision was planned with marking pen. Using a skin blade an incision was made down to subcutaneous tissue. Neurovascular bundles were identify  and retracted out of the way. Small bleeders were cauterized. At this time mass was felt through capsule. An incision was made over capsule. Mass was freed up from surrounding tissue and excise out completely. Mass was measured to be 1.5cm x 1.5cm and white gelatinous consisted of viscous material. Also there was exostosis noted upon removal of the mass which was rasped down with hand held rasp. Wound was flushed with normal saline. Capsule was closed using 3-0 vicryl and sub cutaneous tissue was closed using 3-0 vicryl. Skin was closed using 3-0 prolene. Dry sterile dressing was applied. Tourniquet was deflated. Capillary refill was immediate to all lesser toes.     The patient tolerated the procedure well.  The patient was then transferred to PACU with vital signs stable and vascular status intact to all toes of the operative foot.

## 2016-05-27 NOTE — Anesthesia Procedure Notes (Signed)
Procedure Name: MAC Date/Time: 05/27/2016 8:28 AM Performed by: Franco NonesYATES, Bhumi Godbey S Pre-anesthesia Checklist: Patient identified, Emergency Drugs available, Suction available, Timeout performed and Patient being monitored Patient Re-evaluated:Patient Re-evaluated prior to inductionOxygen Delivery Method: Non-rebreather mask

## 2016-05-27 NOTE — H&P (Signed)
.   HISTORY AND PHYSICAL INTERVAL NOTE:  05/27/2016  8:16 AM  [name]  has presented today for surgery, with the diagnosis of left foot ganglion cyst.  The various methods of treatment have been discussed with the patient.  No guarantees were given.  After consideration of risks, benefits and other options for treatment, the patient has consented to surgery.  I have reviewed the patients' chart and labs.    Patient Vitals for the past 24 hrs:  BP Temp Resp SpO2  05/27/16 0800 108/74 - (!) 27 -  05/27/16 0751 112/75 98.7 F (37.1 C) 17 96 %    A history and physical examination was performed in my office.  The patient was reexamined.  There have been no changes to this history and physical examination.  Erskine EmeryPrayashkumar Steed Kanaan, DPM

## 2016-05-27 NOTE — Anesthesia Preprocedure Evaluation (Signed)
Anesthesia Evaluation  Patient identified by MRN, date of birth, ID band Patient awake    Reviewed: Allergy & Precautions, NPO status , Patient's Chart, lab work & pertinent test results  Airway Mallampati: II  TM Distance: >3 FB     Dental  (+) Teeth Intact   Pulmonary neg pulmonary ROS,    breath sounds clear to auscultation       Cardiovascular negative cardio ROS   Rhythm:Regular Rate:Normal     Neuro/Psych  Headaches, PSYCHIATRIC DISORDERS Anxiety    GI/Hepatic negative GI ROS,   Endo/Other  diabetes (gestational only)  Renal/GU      Musculoskeletal   Abdominal   Peds  Hematology   Anesthesia Other Findings Chronic LBP  Reproductive/Obstetrics                             Anesthesia Physical Anesthesia Plan  ASA: II  Anesthesia Plan: MAC   Post-op Pain Management:    Induction: Intravenous  Airway Management Planned: Simple Face Mask  Additional Equipment:   Intra-op Plan:   Post-operative Plan:   Informed Consent: I have reviewed the patients History and Physical, chart, labs and discussed the procedure including the risks, benefits and alternatives for the proposed anesthesia with the patient or authorized representative who has indicated his/her understanding and acceptance.     Plan Discussed with:   Anesthesia Plan Comments:         Anesthesia Quick Evaluation

## 2016-05-27 NOTE — Brief Op Note (Signed)
05/27/2016  9:42 AM  PATIENT:  Yvette Conley  30 y.o. female  PRE-OPERATIVE DIAGNOSIS:  left foot ganglion cyst  POST-OPERATIVE DIAGNOSIS:  left foot ganglion cyst Exostosis left foot  PROCEDURE:  Procedure(s): EXCISION AND REMOVAL GANGLION CYST LEFT FOOT (Left) Removal of exostosis left foot  SURGEON:  Surgeon(s) and Role:    * Erskine EmeryPrayashkumar Hinley Brimage, DPM - Primary    * Ferman HammingBenjamin McKinney, DPM - Assisting   ANESTHESIA:   MAC  EBL:  Total I/O In: 400 [I.V.:400] Out: 0   BLOOD ADMINISTERED:none  DRAINS: none   LOCAL MEDICATIONS USED:  MARCAINE   , LIDOCAINE  and Amount: 5 ml  SPECIMEN:  Excision  DISPOSITION OF SPECIMEN:  PATHOLOGY  COUNTS:  YES  TOURNIQUET:   Total Tourniquet Time Documented: Leg (Left) - 33 minutes Total: Leg (Left) - 33 minutes   DICTATION: .Reubin Milanragon Dictation  PLAN OF CARE: Discharge to home after PACU  PATIENT DISPOSITION:  PACU - hemodynamically stable.   Delay start of Pharmacological VTE agent (>24hrs) due to surgical blood loss or risk of bleeding: not applicable

## 2016-06-01 ENCOUNTER — Encounter (HOSPITAL_COMMUNITY): Payer: Self-pay | Admitting: Podiatry

## 2016-06-17 ENCOUNTER — Encounter: Payer: BC Managed Care – PPO | Admitting: Physician Assistant

## 2016-07-15 ENCOUNTER — Other Ambulatory Visit: Payer: BC Managed Care – PPO

## 2016-08-04 ENCOUNTER — Other Ambulatory Visit: Payer: Self-pay | Admitting: Family Medicine

## 2016-08-04 ENCOUNTER — Other Ambulatory Visit: Payer: BC Managed Care – PPO

## 2016-08-04 DIAGNOSIS — Z79899 Other long term (current) drug therapy: Secondary | ICD-10-CM

## 2016-08-04 DIAGNOSIS — G248 Other dystonia: Secondary | ICD-10-CM

## 2016-08-04 DIAGNOSIS — G249 Dystonia, unspecified: Secondary | ICD-10-CM

## 2016-08-04 DIAGNOSIS — Z0000 Encounter for general adult medical examination without abnormal findings: Secondary | ICD-10-CM

## 2016-08-04 DIAGNOSIS — E669 Obesity, unspecified: Secondary | ICD-10-CM

## 2016-08-04 LAB — CBC WITH DIFFERENTIAL/PLATELET
BASOS ABS: 0 {cells}/uL (ref 0–200)
BASOS PCT: 0 %
EOS ABS: 59 {cells}/uL (ref 15–500)
Eosinophils Relative: 1 %
HCT: 36 % (ref 35.0–45.0)
Hemoglobin: 11.8 g/dL — ABNORMAL LOW (ref 12.0–15.0)
LYMPHS ABS: 1475 {cells}/uL (ref 850–3900)
LYMPHS PCT: 25 %
MCH: 30.3 pg (ref 27.0–33.0)
MCHC: 32.8 g/dL (ref 32.0–36.0)
MCV: 92.3 fL (ref 80.0–100.0)
MONOS PCT: 7 %
MPV: 9.9 fL (ref 7.5–12.5)
Monocytes Absolute: 413 cells/uL (ref 200–950)
NEUTROS PCT: 67 %
Neutro Abs: 3953 cells/uL (ref 1500–7800)
PLATELETS: 355 10*3/uL (ref 140–400)
RBC: 3.9 MIL/uL (ref 3.80–5.10)
RDW: 13.3 % (ref 11.0–15.0)
WBC: 5.9 10*3/uL (ref 3.8–10.8)

## 2016-08-04 LAB — COMPLETE METABOLIC PANEL WITH GFR
ALBUMIN: 4.2 g/dL (ref 3.6–5.1)
ALK PHOS: 52 U/L (ref 33–115)
ALT: 14 U/L (ref 6–29)
AST: 14 U/L (ref 10–30)
BUN: 10 mg/dL (ref 7–25)
CO2: 25 mmol/L (ref 20–31)
Calcium: 9.3 mg/dL (ref 8.6–10.2)
Chloride: 105 mmol/L (ref 98–110)
Creat: 0.66 mg/dL (ref 0.50–1.10)
GFR, Est Non African American: >89 mL/min (ref >=60)
GLUCOSE: 94 mg/dL (ref 70–99)
POTASSIUM: 4.1 mmol/L (ref 3.5–5.3)
SODIUM: 140 mmol/L (ref 135–146)
Total Bilirubin: 0.5 mg/dL (ref 0.2–1.2)
Total Protein: 7.4 g/dL (ref 6.1–8.1)

## 2016-08-04 LAB — LIPID PANEL
Cholesterol: 138 mg/dL (ref <200)
HDL: 66 mg/dL (ref >50)
LDL CALC: 59 mg/dL (ref <100)
Total CHOL/HDL Ratio: 2.1 Ratio (ref <5.0)
Triglycerides: 63 mg/dL (ref <150)
VLDL: 13 mg/dL (ref <30)

## 2016-08-04 LAB — TSH: TSH: 1.11 mIU/L

## 2016-08-05 ENCOUNTER — Encounter: Payer: Self-pay | Admitting: Family Medicine

## 2016-08-10 ENCOUNTER — Ambulatory Visit (INDEPENDENT_AMBULATORY_CARE_PROVIDER_SITE_OTHER): Payer: BC Managed Care – PPO | Admitting: Family Medicine

## 2016-08-10 ENCOUNTER — Encounter: Payer: BC Managed Care – PPO | Admitting: Family Medicine

## 2016-08-10 ENCOUNTER — Telehealth: Payer: Self-pay | Admitting: *Deleted

## 2016-08-10 ENCOUNTER — Encounter: Payer: Self-pay | Admitting: Family Medicine

## 2016-08-10 VITALS — BP 110/78 | HR 88 | Temp 98.2°F | Resp 16 | Ht 67.0 in | Wt 257.0 lb

## 2016-08-10 DIAGNOSIS — Z6841 Body Mass Index (BMI) 40.0 and over, adult: Secondary | ICD-10-CM | POA: Diagnosis not present

## 2016-08-10 DIAGNOSIS — Z0000 Encounter for general adult medical examination without abnormal findings: Secondary | ICD-10-CM

## 2016-08-10 DIAGNOSIS — IMO0001 Reserved for inherently not codable concepts without codable children: Secondary | ICD-10-CM

## 2016-08-10 DIAGNOSIS — Z124 Encounter for screening for malignant neoplasm of cervix: Secondary | ICD-10-CM

## 2016-08-10 DIAGNOSIS — B379 Candidiasis, unspecified: Secondary | ICD-10-CM

## 2016-08-10 DIAGNOSIS — R300 Dysuria: Secondary | ICD-10-CM

## 2016-08-10 DIAGNOSIS — E6609 Other obesity due to excess calories: Secondary | ICD-10-CM

## 2016-08-10 DIAGNOSIS — Z3200 Encounter for pregnancy test, result unknown: Secondary | ICD-10-CM

## 2016-08-10 DIAGNOSIS — N926 Irregular menstruation, unspecified: Secondary | ICD-10-CM

## 2016-08-10 LAB — WET PREP FOR TRICH, YEAST, CLUE
CLUE CELLS WET PREP: NONE SEEN
Trich, Wet Prep: NONE SEEN

## 2016-08-10 LAB — PREGNANCY, URINE: Preg Test, Ur: NEGATIVE

## 2016-08-10 MED ORDER — LIRAGLUTIDE -WEIGHT MANAGEMENT 18 MG/3ML ~~LOC~~ SOPN: 0.6000 mg | PEN_INJECTOR | Freq: Every day | SUBCUTANEOUS | 2 refills | Status: DC

## 2016-08-10 MED ORDER — FLUCONAZOLE 150 MG PO TABS: ORAL_TABLET | ORAL | 0 refills | Status: DC

## 2016-08-10 NOTE — Patient Instructions (Signed)
Take diflucan Start Saxenda inject 0.6mg  daily for  week 1  1.2mg  daily Week 2   1.8mg  daily Week 3  2.4MG  Daily Week 4  3mg  daily Week 5   F/U 8 weeks for weight

## 2016-08-10 NOTE — Assessment & Plan Note (Signed)
Discussed options trial of Saxenda, discussed side effects and how to use medication F/u 8 weeks for weight

## 2016-08-10 NOTE — Telephone Encounter (Signed)
Received request from pharmacy for PA on Saxenda.  PA submitted.   Dx: E66.01- obesity.   Your information has been submitted to [employer]. To check for an updated outcome later, reopen this PA request from your dashboard.  If [employer] has not responded to your request within 24 hours, contact [employer] at [phone]. If you think there may be a problem with your PA request, use our live chat feature at the bottom right.

## 2016-08-10 NOTE — Progress Notes (Signed)
Subjective:    Patient ID: Yvette Conley, female    DOB: [birth year], 31 y.o.   MRN: [id number]  Patient presents for CPE with PAP (has had labs) Pt  here for complete physical exam.   She is followed by GYN Dr. Cherly Hensenousins- Pap smears up-to-date Immunizations are up-to-date with the exception of flu shot Fasting labs reviewed Her last visit she has had removal of the ganglion cyst on her foot Fasting labs were reviewed-  Labs normal except minimally low Hb 11.8 No history of abnormal PAP Smear  LMP- Middle of January had spotting, not regular flow more of old blood appearance   She has had some vaginal discharge which is itching a couple weeks ago. She also had a couple episodes of burning with urination  Obesity- she will like to try something to help with her weight loss. She does admit that she is eating out too much he used to many fried foods. She eats something to help curb her appetite. She is not exercising on a regular basis. Her BMI is greater than 40 and she has family history of diabetes  Review Of Systems:  GEN- denies fatigue, fever, weight loss,weakness, recent illness HEENT- denies eye drainage, change in vision, nasal discharge, CVS- denies chest pain, palpitations RESP- denies SOB, cough, wheeze ABD- denies N/V, change in stools, abd pain GU- denies dysuria, hematuria, dribbling, incontinence MSK- denies joint pain, muscle aches, injury Neuro- denies headache, dizziness, syncope, seizure activity       Objective:    BP 110/78   Pulse 88   Temp 98.2 F (36.8 C) (Oral)   Resp 16   Ht 5\' 7"  (1.702 m)   Wt 257 lb (116.6 kg)   LMP 07/06/2016   SpO2 98%   BMI 40.25 kg/m  GEN- NAD, alert and oriented x3 HEENT- PERRL, EOMI, non injected sclera, pink conjunctiva, MMM, oropharynx clear Neck- Supple, no thyromegaly Breast- normal symmetry, no nipple inversion,no nipple drainage, no nodules or lumps felt,large breast size, indentations in shoulder from bra Nodes-  no axillary nodes CVS- RRR, no murmur RESP-CTAB ABD-NABS,soft,NT,ND GU- normal external genitalia, vaginal mucosa pink and moist, cervix visualized no growth, +blood form os, + white discharge, no CMT, no ovarian masses, uterus normal size EXT- No edema Pulses- Radial, DP- 2+        Assessment & Plan:      Problem List Items Addressed This Visit    Obesity    Discussed options trial of Saxenda, discussed side effects and how to use medication F/u 8 weeks for weight      Irregular menses    History of irregular menses, Upreg neg Has spotting today expect cycle to start       Other Visit Diagnoses    Encounter for pregnancy test, result unknown    -  Primary   Relevant Orders   Pregnancy, urine (Completed)   Routine general medical examination at a health care facility       CPE Done, PAP Smear done reviwed fasting labs   Cervical cancer screening       PAP Smear done   Relevant Orders   PAP, ThinPrep ASCUS Rflx HPV Rflx Type   Dysuria       Relevant Orders   Urinalysis, Routine w reflex microscopic   Yeast infection       treat with diflucan   Relevant Orders   WET PREP FOR TRICH, YEAST, CLUE (Completed)      Note:  This dictation was prepared with Dragon dictation along with smaller phrase technology. Any transcriptional errors that result from this process are unintentional.

## 2016-08-10 NOTE — Assessment & Plan Note (Signed)
History of irregular menses, Upreg neg Has spotting today expect cycle to start

## 2016-08-11 LAB — URINALYSIS, ROUTINE W REFLEX MICROSCOPIC
Bilirubin Urine: NEGATIVE
GLUCOSE, UA: NEGATIVE
Hgb urine dipstick: NEGATIVE
KETONES UR: NEGATIVE
Nitrite: NEGATIVE
PH: 5.5 (ref 5.0–8.0)
Protein, ur: NEGATIVE
SPECIFIC GRAVITY, URINE: 1.02 (ref 1.001–1.035)

## 2016-08-11 LAB — URINALYSIS, MICROSCOPIC ONLY
CASTS: NONE SEEN [LPF]
CRYSTALS: NONE SEEN [HPF]

## 2016-08-11 MED ORDER — LIRAGLUTIDE -WEIGHT MANAGEMENT 18 MG/3ML ~~LOC~~ SOPN: 0.6000 mg | PEN_INJECTOR | Freq: Every day | SUBCUTANEOUS | 2 refills | Status: DC

## 2016-08-11 NOTE — Addendum Note (Signed)
Addended by: Milinda AntisURHAM, Harold Mattes F on: 08/11/2016 02:19 PM   Modules accepted: Orders

## 2016-08-11 NOTE — Telephone Encounter (Signed)
Received PA determination.   PA approved 08/10/2016- 12/08/2016.  Pharmacy made aware.

## 2016-08-12 LAB — URINE CULTURE: ORGANISM ID, BACTERIA: NO GROWTH

## 2016-08-21 LAB — PAP THINPREP ASCUS RFLX HPV RFLX TYPE

## 2016-08-24 ENCOUNTER — Encounter: Payer: Self-pay | Admitting: Family Medicine

## 2016-10-05 ENCOUNTER — Ambulatory Visit (INDEPENDENT_AMBULATORY_CARE_PROVIDER_SITE_OTHER): Payer: BC Managed Care – PPO | Admitting: Family Medicine

## 2016-10-05 ENCOUNTER — Encounter: Payer: Self-pay | Admitting: Family Medicine

## 2016-10-05 VITALS — BP 112/68 | HR 90 | Temp 98.8°F | Resp 14 | Ht 67.0 in | Wt 254.0 lb

## 2016-10-05 DIAGNOSIS — E6609 Other obesity due to excess calories: Secondary | ICD-10-CM | POA: Diagnosis not present

## 2016-10-05 DIAGNOSIS — Z6839 Body mass index (BMI) 39.0-39.9, adult: Secondary | ICD-10-CM

## 2016-10-05 MED ORDER — INSULIN PEN NEEDLE 32G X 6 MM MISC: 3 refills | Status: DC

## 2016-10-05 NOTE — Assessment & Plan Note (Addendum)
Discussed diet in detail, she is eating out a lot and has lots of carbs/sugars. Cut down soda 1 a day, cut out break and potatoes and fried foods. Will see if see if simplyfying things helps more Start walking 3 days a week 15-30 minutes Recheck in 8 weeks, goal is 5% total of body weight by that visit 12.5lbs

## 2016-10-05 NOTE — Progress Notes (Signed)
   Subjective:    Patient ID: Yvette Conley, female    DOB: 1986-02-28, 31 y.o.   MRN: [id number]  Patient presents for Follow-up (is not fasting)   Patient here for follow-up. She was started on Saxenda for obesity. Currently on  once a day . Admits to difficulty with controlling her appetite and eating out too much. Family history of diabetes. Her weight on February 26 was 257 pounds She's not had any significant side effects with the injection. Dietary changes-has noticed difference inches , this morning scale at home 244lbs  /  Her scale at home on 08/11/16  250.6lbs    24 hour recall- No breakfast, Lunch- Chicken Nuggets, in evening- Hamburger steak/gravy, mashed potatoes, fried squash, Dinner- [employer] [place], [place] [employer] Activity- Has not started exercise  Sleep- No concerns   Review Of Systems:  GEN- denies fatigue, fever, weight loss,weakness, recent illness HEENT- denies eye drainage, change in vision, nasal discharge, CVS- denies chest pain, palpitations RESP- denies SOB, cough, wheeze ABD- denies N/V, change in stools, abd pain GU- denies dysuria, hematuria, dribbling, incontinence MSK- denies joint pain, muscle aches, injury Neuro- denies headache, dizziness, syncope, seizure activity       Objective:    BP 112/68   Pulse 90   Temp 98.8 F (37.1 C) (Oral)   Resp 14   Ht  (1.702 m)   Wt 254 lb (115.2 kg)   SpO2 98%   BMI 39.78 kg/m  GEN- NAD, alert and oriented x3 CVS- RRR, no murmur RESP-CTAB EXT- No edema Pulses- Radial  2+        Assessment & Plan:      Problem List Items Addressed This Visit    Obesity - Primary    Discussed diet in detail, she is eating out a lot and has lots of carbs/sugars. Cut down soda 1 a day, cut out break and potatoes and fried foods. Will see if see if simplyfying things helps more Start walking 3 days a week 15-30 minutes Recheck in 8 weeks, goal is 5% total of body weight by that visit 12.5lbs         Note: This dictation was prepared with Dragon dictation along with smaller phrase technology. Any transcriptional errors that result from this process are unintentional.

## 2016-10-05 NOTE — Patient Instructions (Addendum)
Goal of 15lbs off by next visit  Start walking/activity 30 minutes 3 days a week  F/U 2 months

## 2016-11-12 ENCOUNTER — Other Ambulatory Visit: Payer: Self-pay | Admitting: Family Medicine

## 2016-12-07 ENCOUNTER — Ambulatory Visit: Payer: BC Managed Care – PPO | Admitting: Family Medicine

## 2016-12-09 ENCOUNTER — Encounter: Payer: Self-pay | Admitting: Family Medicine

## 2016-12-09 ENCOUNTER — Ambulatory Visit (INDEPENDENT_AMBULATORY_CARE_PROVIDER_SITE_OTHER): Payer: BC Managed Care – PPO | Admitting: Family Medicine

## 2016-12-09 VITALS — BP 112/68 | HR 88 | Temp 97.9°F | Resp 14 | Ht 67.0 in | Wt 238.0 lb

## 2016-12-09 DIAGNOSIS — N760 Acute vaginitis: Secondary | ICD-10-CM | POA: Diagnosis not present

## 2016-12-09 DIAGNOSIS — R109 Unspecified abdominal pain: Secondary | ICD-10-CM

## 2016-12-09 DIAGNOSIS — B9689 Other specified bacterial agents as the cause of diseases classified elsewhere: Secondary | ICD-10-CM | POA: Diagnosis not present

## 2016-12-09 DIAGNOSIS — Z6837 Body mass index (BMI) 37.0-37.9, adult: Secondary | ICD-10-CM | POA: Diagnosis not present

## 2016-12-09 DIAGNOSIS — E6609 Other obesity due to excess calories: Secondary | ICD-10-CM

## 2016-12-09 LAB — WET PREP FOR TRICH, YEAST, CLUE
Trich, Wet Prep: NONE SEEN
YEAST WET PREP: NONE SEEN

## 2016-12-09 LAB — URINALYSIS, ROUTINE W REFLEX MICROSCOPIC
Bilirubin Urine: NEGATIVE
Glucose, UA: NEGATIVE
HGB URINE DIPSTICK: NEGATIVE
Ketones, ur: NEGATIVE
LEUKOCYTES UA: NEGATIVE
NITRITE: NEGATIVE
Specific Gravity, Urine: >1.03 (ref 1.001–1.035)
pH: 5.5 (ref 5.0–8.0)

## 2016-12-09 MED ORDER — METRONIDAZOLE 500 MG PO TABS: 500.0000 mg | ORAL_TABLET | Freq: Two times a day (BID) | ORAL | 0 refills | Status: DC

## 2016-12-09 NOTE — Patient Instructions (Signed)
F/u 2 MONTHS for weight

## 2016-12-09 NOTE — Assessment & Plan Note (Addendum)
Weight continues to improve with Saxenda, continue 3mg  daily  Discussed cutting out soda completely, using sparking water with flavoring Increasing protein, cutting out crackers for snacks and meals

## 2016-12-09 NOTE — Progress Notes (Signed)
   Subjective:    Patient ID: Yvette Conley, female    DOB: December 06, 1985, 31 y.o.   MRN: [id number]  Patient presents for Follow-up [facility](Saxenda) and Dysuria (unable to leave sample)  Patient to follow-up weight loss. She was started on Saxenda in February she was 257 pounds. At her follow-up in April she was down to 254 pounds. This is her 8 week follow-up from that physical was to be down another 12 pounds she is currently 238 pounds. Her last visit we discussed her carb intake increasing protein and veggies and water. She is on 3mg  of Saxenda, no severe nausea  Has pain in left upper side, has some sharp pains, no burning no pressure, uses restroom a little more increasing water  Has some soreness with intercourse as well, and discharge  LMP a few weeks ago       Review Of Systems:  GEN- denies fatigue, fever, weight loss,weakness, recent illness HEENT- denies eye drainage, change in vision, nasal discharge, CVS- denies chest pain, palpitations RESP- denies SOB, cough, wheeze ABD- denies N/V, change in stools, abd pain GU- denies dysuria, hematuria, dribbling, incontinence MSK- denies joint pain, muscle aches, injury Neuro- denies headache, dizziness, syncope, seizure activity       Objective:    BP 112/68   Pulse 88   Temp 97.9 F (36.6 C) (Oral)   Resp 14   Ht 5\' 7"  (1.702 m)   Wt 238 lb (108 kg)   SpO2 99%   BMI 37.28 kg/m  GEN- NAD, alert and oriented x3 HEENT- PERRL, EOMI, non injected sclera, pink conjunctiva, MMM, oropharynx clear Neck- Supple, no thyromegaly CVS- RRR, no murmur RESP-CTAB ABD-NABS,soft,NT,ND GU- normal external genitalia, vaginal mucosa pink and moist, cervix visualized no growth, no blood form os,+discharge, no CMT, no ovarian masses, uterus normal size EXT- No edema Pulses- Radial 2+        Assessment & Plan:      Problem List Items Addressed This Visit    Obesity - Primary    Weight continues to improve with Saxenda, continue 3mg   daily  Discussed cutting out soda completely, using sparking water with flavoring Increasing protein, cutting out crackers for snacks and meals        Other Visit Diagnoses    BV (bacterial vaginosis)       Treat with Flagyl   Relevant Orders   WET PREP FOR TRICH, YEAST, CLUE (Completed)   Left sided abdominal pain       Relevant Orders   Urinalysis, Routine w reflex microscopic      Note: This dictation was prepared with Dragon dictation along with smaller phrase technology. Any transcriptional errors that result from this process are unintentional.

## 2016-12-10 ENCOUNTER — Telehealth: Payer: Self-pay | Admitting: *Deleted

## 2016-12-10 NOTE — Telephone Encounter (Signed)
Your PA has been faxed to the plan as a paper copy. Please contact the plan directly if you haven't received a determination in a typical timeframe.  You will be notified of the determination electronically and via fax. 

## 2016-12-10 NOTE — Telephone Encounter (Signed)
Received request from pharmacy for PA on Saxenda.  PA submitted.   Dx: E66.01- Obesity.  BMI: 2/26- 40.3 6/27- 37.4

## 2016-12-11 ENCOUNTER — Encounter: Payer: Self-pay | Admitting: Family Medicine

## 2016-12-14 NOTE — Telephone Encounter (Signed)
Received PA determination.   PA denied. Appeal Faxed.   Per your denial, current criteria does not allow coverage of Saxenda if it is unknown if the patient lost at least  4% of baseline body weight or continued to maintain weight loss.   Baseline body weight:      257lbs Current body weight:       238lbs Reduction Percentage:    7.39%

## 2016-12-17 ENCOUNTER — Ambulatory Visit
Admission: RE | Admit: 2016-12-17 | Discharge: 2016-12-17 | Disposition: A | Discharge status: Home / Self Care | Payer: BC Managed Care – PPO | Source: Ambulatory Visit | Attending: Obstetrics and Gynecology | Admitting: Obstetrics and Gynecology

## 2016-12-17 ENCOUNTER — Other Ambulatory Visit: Payer: Self-pay | Admitting: Obstetrics and Gynecology

## 2016-12-17 DIAGNOSIS — R1032 Left lower quadrant pain: Secondary | ICD-10-CM

## 2016-12-17 DIAGNOSIS — R310 Gross hematuria: Secondary | ICD-10-CM

## 2016-12-18 NOTE — Telephone Encounter (Signed)
Received fax from insurance. Advised that PA is not required at this time. Next available fill date is 01/20/2017.  Call placed to pharmacy. Reports that patient last filled prescription on 11/20/2016.

## 2017-02-10 ENCOUNTER — Ambulatory Visit: Payer: BC Managed Care – PPO | Admitting: Family Medicine

## 2017-02-23 ENCOUNTER — Encounter: Payer: Self-pay | Admitting: Family Medicine

## 2017-02-23 ENCOUNTER — Ambulatory Visit (INDEPENDENT_AMBULATORY_CARE_PROVIDER_SITE_OTHER): Payer: BC Managed Care – PPO | Admitting: Family Medicine

## 2017-02-23 VITALS — BP 108/60 | HR 86 | Temp 98.4°F | Resp 16 | Ht 67.0 in | Wt 239.0 lb

## 2017-02-23 DIAGNOSIS — K580 Irritable bowel syndrome with diarrhea: Secondary | ICD-10-CM

## 2017-02-23 DIAGNOSIS — E6609 Other obesity due to excess calories: Secondary | ICD-10-CM

## 2017-02-23 DIAGNOSIS — Z6837 Body mass index (BMI) 37.0-37.9, adult: Secondary | ICD-10-CM

## 2017-02-23 DIAGNOSIS — K589 Irritable bowel syndrome without diarrhea: Secondary | ICD-10-CM | POA: Insufficient documentation

## 2017-02-23 NOTE — Assessment & Plan Note (Signed)
Try her on probiotic and see how she does. She does not have any excessive diarrhea. Her abdominal exam is benign.

## 2017-02-23 NOTE — Patient Instructions (Addendum)
Try the probiotic- Align,Restora Goal 10lbs by December  F/U 3 months

## 2017-02-23 NOTE — Assessment & Plan Note (Signed)
Reiterated the routine which she taken off with during the summer with her eating habits. We discussed meal planning again. I think that she continues to benefit initially maintained her weight loss over the past few months. Goal is 10 pounds weight loss over the next 3 months.

## 2017-02-23 NOTE — Progress Notes (Signed)
   Subjective:    Patient ID: Yvette Conley, female    DOB: July 11, 1985, 31 y.o.   MRN: [id number]  Patient presents for Medication Management  Issue here to follow-up weight loss on Saxenda. She was started in February at that time her weight was 257 pounds. Her last visit in June she was found to 238 pounds. She's been working on decreasing her carb intake and increasing her protein veggies and water. She's been tolerating the medication without any difficulty. We discussed cutting out her soda using sparkling water with laboring. Exercise routine- Losing inches some pants down to size 16   Breakfast-  Pop tart  Lunch- today 1 slice piza, kiwi/carrots,   GETS SOME loose bowels, gassy pain/ when urgency hits, has [place], told IBS in past, so never brought it up. She does not get constipated. She has not specifically noted any particular foods that set off her symptoms.  Review Of Systems:  GEN- denies fatigue, fever, weight loss,weakness, recent illness HEENT- denies eye drainage, change in vision, nasal discharge, CVS- denies chest pain, palpitations RESP- denies SOB, cough, wheeze ABD- denies N/V, +change in stools, abd pain GU- denies dysuria, hematuria, dribbling, incontinence MSK- denies joint pain, muscle aches, injury Neuro- denies headache, dizziness, syncope, seizure activity       Objective:    BP 108/60   Pulse 86   Temp 98.4 F (36.9 C) (Oral)   Resp 16   Ht 5\' 7"  (1.702 m)   Wt 239 lb (108.4 kg)   BMI 37.43 kg/m  GEN- NAD, alert and oriented x3 HEENT- PERRL, EOMI, non injected sclera, pink conjunctiva, MMM, oropharynx clear CVS- RRR, no murmur RESP-CTAB ABD-NABS,soft,NT,ND EXT- No edema Pulses- Radial2+        Assessment & Plan:      Problem List Items Addressed This Visit      Unprioritized   Obesity - Primary    Reiterated the routine which she taken off with during the summer with her eating habits. We discussed meal planning again. I think  that she continues to benefit initially maintained her weight loss over the past few months. Goal is 10 pounds weight loss over the next 3 months.      IBS (irritable bowel syndrome)    Try her on probiotic and see how she does. She does not have any excessive diarrhea. Her abdominal exam is benign.         Note: This dictation was prepared with Dragon dictation along with smaller phrase technology. Any transcriptional errors that result from this process are unintentional.

## 2017-03-17 ENCOUNTER — Ambulatory Visit (INDEPENDENT_AMBULATORY_CARE_PROVIDER_SITE_OTHER): Payer: BC Managed Care – PPO | Admitting: Family Medicine

## 2017-03-17 ENCOUNTER — Encounter: Payer: Self-pay | Admitting: Family Medicine

## 2017-03-17 VITALS — BP 104/68 | HR 84 | Temp 98.7°F | Resp 18 | Ht 67.0 in | Wt 238.0 lb

## 2017-03-17 DIAGNOSIS — J040 Acute laryngitis: Secondary | ICD-10-CM | POA: Diagnosis not present

## 2017-03-17 DIAGNOSIS — J019 Acute sinusitis, unspecified: Secondary | ICD-10-CM | POA: Diagnosis not present

## 2017-03-17 MED ORDER — AMOXICILLIN 875 MG PO TABS: 875.0000 mg | ORAL_TABLET | Freq: Two times a day (BID) | ORAL | 0 refills | Status: DC

## 2017-03-17 NOTE — Progress Notes (Signed)
   Subjective:    Patient ID: Yvette Conley, female    DOB: 03-11-1986, 31 y.o.   MRN: [id number]  Patient presents for Head and chest congestion, cough, runny nose, HA   Pt here with progressive sinus pain, drainage, headache congestion. Started as allergies took OTC anti-histamine, then symptoms progressed over past 2 weeks. Used theraflu, hot tea for throat/voice. Has had chills past 2 days. 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 No GI symptoms Minimal cough, more from drainage in throat    Review Of Systems:  GEN- + fatigue, fever, weight loss,weakness, recent illness HEENT- denies eye drainage, change in vision,+ nasal discharge, CVS- denies chest pain, palpitations RESP- denies SOB, +cough, wheeze ABD- denies N/V, change in stools, abd pain GU- denies dysuria, hematuria, dribbling, incontinence MSK- denies joint pain, muscle aches, injury Neuro- denies headache, dizziness, syncope, seizure activity       Objective:    BP 104/68   Pulse 84   Temp 98.7 F (37.1 C) (Oral)   Resp 18   Ht  (1.702 m)   Wt 238 lb (108 kg)   BMI 37.28 kg/m  GEN- NAD, alert and oriented x3 HEENT- PERRL, EOMI, non injected sclera, pink conjunctiva, MMM, oropharynx mild injection, TM clear bilat no effusion,  + maxillary sinus tenderness, inflammed turbinates,  Nasal drainage ,hoarse voice  Neck- Supple, shotty LAD CVS- RRR, no murmur RESP-CTAB EXT- No edema Pulses- Radial 2+          Assessment & Plan:      Problem List Items Addressed This Visit    None    Visit Diagnoses    Acute rhinosinusitis    -  Primary   Amoxicillin, nasal steroid, sudafed OTC. Fluids, voice rest for laryngitis, given note for work   Relevant Medications   amoxicillin (AMOXIL) 875 MG tablet   Acute laryngitis          Note: This dictation was prepared with Dragon dictation along with smaller phrase technology. Any transcriptional errors that result from this process are unintentional.

## 2017-03-17 NOTE — Patient Instructions (Signed)
AMoxicillin Sudafed  Flonase/ nasocort F/u as needed

## 2017-04-28 ENCOUNTER — Encounter: Payer: Self-pay | Admitting: Family Medicine

## 2017-04-28 ENCOUNTER — Ambulatory Visit: Payer: BC Managed Care – PPO | Admitting: Family Medicine

## 2017-04-28 ENCOUNTER — Other Ambulatory Visit: Payer: Self-pay

## 2017-04-28 VITALS — BP 110/68 | HR 88 | Temp 98.8°F | Resp 14 | Ht 67.0 in | Wt 239.0 lb

## 2017-04-28 DIAGNOSIS — J209 Acute bronchitis, unspecified: Secondary | ICD-10-CM | POA: Diagnosis not present

## 2017-04-28 DIAGNOSIS — J040 Acute laryngitis: Secondary | ICD-10-CM

## 2017-04-28 LAB — INFLUENZA A AND B AG, IMMUNOASSAY
INFLUENZA A ANTIGEN: NOT DETECTED
INFLUENZA B ANTIGEN: NOT DETECTED

## 2017-04-28 MED ORDER — DICLOFENAC SODIUM 75 MG PO TBEC: 75.0000 mg | DELAYED_RELEASE_TABLET | Freq: Two times a day (BID) | ORAL | 0 refills | Status: DC

## 2017-04-28 MED ORDER — METHYLPREDNISOLONE ACETATE 40 MG/ML IJ SUSP
40.0000 mg | Freq: Once | INTRAMUSCULAR | Status: AC
  Administered 2017-04-28: 40 mg via INTRAMUSCULAR

## 2017-04-28 MED ORDER — AMOXICILLIN-POT CLAVULANATE 875-125 MG PO TABS: 1.0000 | ORAL_TABLET | Freq: Two times a day (BID) | ORAL | 0 refills | Status: DC

## 2017-04-28 MED ORDER — CYCLOBENZAPRINE HCL 10 MG PO TABS: 10.0000 mg | ORAL_TABLET | Freq: Three times a day (TID) | ORAL | 0 refills | Status: DC | PRN

## 2017-04-28 MED ORDER — PREDNISONE 10 MG PO TABS: ORAL_TABLET | ORAL | 0 refills | Status: DC

## 2017-04-28 NOTE — Patient Instructions (Addendum)
F/U AS previous  Give note for work for today, can return tomorrow Take steroids and antibiotics

## 2017-04-28 NOTE — Progress Notes (Signed)
   Subjective:    Patient ID: Yvette Conley, female    DOB: 1985/07/14, 31 y.o.   MRN: [id number]  Patient presents for Illness (x2 weeks- productive cough with yellow to brown mucus, hoarse, chest congestion, fever/ chills, muscle aches) Patient here with 2 weeks of cough with mild production chest congestion subjective fever and chills.  She has worked in the past 3 days.  She was treated for sinus infection back in October.  She did improve after that treatment.  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  She has been trying over-the-counter medications but getting progressively worse.  She now has lost her voice. This morning she also felt some tightness when she was coughing.   She also requests a refill on her muscle relaxer she has history of chronic back pain is had a torn muscle in the past.  Was on Flexeril and diclofenac    Review Of Systems:  GEN- denies fatigue, fever, weight loss,weakness, recent illness HEENT- denies eye drainage, change in vision, nasal discharge, CVS- denies chest pain, palpitations RESP- denies SOB, cough, wheeze ABD- denies N/V, change in stools, abd pain GU- denies dysuria, hematuria, dribbling, incontinence MSK- denies joint pain, muscle aches, injury Neuro- denies headache, dizziness, syncope, seizure activity       Objective:    BP 110/68   Pulse 88   Temp 98.8 F (37.1 C) (Oral)   Resp 14   Ht 5\' 7"  (1.702 m)   Wt 239 lb (108.4 kg)   SpO2 99%   BMI 37.43 kg/m  GEN- NAD, alert and oriented x3 HEENT- PERRL, EOMI, non injected sclera, pink conjunctiva, MMM, oropharynx clear, nares clear rhinorhea, no maxillary sinus tenderness , hoarse voice  Neck- Supple, shotty LAD CVS- RRR, no murmur RESP-CTAB EXT- No edema Pulses- Radial, DP- 2+   Flu neg      Assessment & Plan:      Problem List Items Addressed This Visit    None    Visit Diagnoses    Acute bronchitis, unspecified  organism    -  Primary   2nd illness in past 6 weeks, treat with Depo Medrol, predinsone taper, augmentin, nasal saline rinse, OTC cough med   Relevant Medications   methylPREDNISolone acetate (DEPO-MEDROL) injection 40 mg (Completed)   Other Relevant Orders   Influenza A and B Ag, Immunoassay (Completed)   Acute laryngitis       Relevant Medications   methylPREDNISolone acetate (DEPO-MEDROL) injection 40 mg (Completed)      Note: This dictation was prepared with Dragon dictation along with smaller phrase technology. Any transcriptional errors that result from this process are unintentional.

## 2017-04-30 ENCOUNTER — Encounter: Payer: Self-pay | Admitting: Family Medicine

## 2017-04-30 MED ORDER — INSULIN PEN NEEDLE 32G X 6 MM MISC: 3 refills | Status: DC

## 2017-05-25 ENCOUNTER — Ambulatory Visit: Payer: BC Managed Care – PPO | Admitting: Family Medicine

## 2017-06-18 ENCOUNTER — Telehealth: Payer: Self-pay | Admitting: *Deleted

## 2017-06-18 MED ORDER — AZITHROMYCIN 250 MG PO TABS: ORAL_TABLET | ORAL | 0 refills | Status: DC

## 2017-06-18 NOTE — Telephone Encounter (Signed)
Call placed to patient and patient made aware.   Prescription sent to pharmacy.  

## 2017-06-18 NOTE — Telephone Encounter (Signed)
She can take zpak mucinex DM Nasal spray If not better needs OV

## 2017-06-18 NOTE — Telephone Encounter (Signed)
Received VM from patient.    States that she has similar symptoms as she exhibited in November and was diagnosed with bronchitis and laryngitis. Reports x2 weeks cough with thick mucus production, hoarseness, chest congestion and fever. States that she has been treating symptoms with OTC medications with no relief. Requested MD to advise if ABTx can be sent to pharmacy.

## 2017-08-02 ENCOUNTER — Encounter: Payer: Self-pay | Admitting: Family Medicine

## 2017-08-02 ENCOUNTER — Ambulatory Visit: Payer: BC Managed Care – PPO | Admitting: Family Medicine

## 2017-08-02 VITALS — BP 110/78 | HR 113 | Temp 98.7°F | Resp 14 | Wt 241.1 lb

## 2017-08-02 DIAGNOSIS — Z20828 Contact with and (suspected) exposure to other viral communicable diseases: Secondary | ICD-10-CM | POA: Diagnosis not present

## 2017-08-02 DIAGNOSIS — B349 Viral infection, unspecified: Secondary | ICD-10-CM | POA: Diagnosis not present

## 2017-08-02 LAB — INFLUENZA A AND B AG, IMMUNOASSAY
INFLUENZA A ANTIGEN: NOT DETECTED
INFLUENZA B ANTIGEN: NOT DETECTED

## 2017-08-02 NOTE — Addendum Note (Signed)
Addended by: Phineas SemenJOHNSON, Emmalyn Hinson A on: 08/02/2017 02:31 PM   Modules accepted: Orders

## 2017-08-02 NOTE — Progress Notes (Signed)
   Subjective:    Patient ID: Yvette Conley, female    DOB: [birth year], 32 y.o.   MRN: [id number]  Patient presents for Generalized Body Aches (x1week); Chills; and Abdominal Pain   Last Sunday, had itchy eye and didn't have nergy, but End of week, sore throat, headache, subjective fever Cough with production. Taking OTC cold and FLu   Daughter diagnosed with Flu on last Friday and strep throat  No vomiting or diarrhea     Review Of Systems:  GEN- + fatigue,+ fever, weight loss,weakness, recent illness HEENT- denies eye drainage, change in vision, nasal discharge, CVS- denies chest pain, palpitations RESP- denies SOB,+ cough, wheeze ABD- denies N/V, change in stools, abd pain GU- denies dysuria, hematuria, dribbling, incontinence MSK- denies joint pain, +muscle aches, injury Neuro+ headache, dizziness, syncope, seizure activity       Objective:    BP 110/78   Pulse (!) 113   Temp 98.7 F (37.1 C) (Oral)   Resp 14   Wt 241 lb 1 oz (109.3 kg)   LMP 07/12/2017 (Approximate)   SpO2 98%   BMI 37.76 kg/m  GEN- NAD, alert and oriented x3 HEENT- PERRL, EOMI, non injected sclera, pink conjunctiva, MMM, oropharynx mild injection Neck- Supple, shotty submandibular LAD CVS- Mild tachycardia HR 100, no murmur RESP-CTAB EXT- No edema Pulses- Radial  2+   Flu Negative /strep NEG     Assessment & Plan:      Problem List Items Addressed This Visit    None    Visit Diagnoses    Viral illness    -  Primary   Flu and strep neg, over 72 hours with symptoms, not likley to benefit from Tamiflu, cost also an issue for her, push fluids, continue OTC meds, rest, vitamin C,   Relevant Orders   Influenza A and B Ag, Immunoassay   Exposure to the flu       Out of work today and tomorrow      Note: This dictation was prepared with Office managerDragon dictation along with smaller Lobbyistphrase technology. Any transcriptional errors that result from this process are unintentional.

## 2017-08-02 NOTE — Patient Instructions (Addendum)
F/U 2 months for WEIGHT Give note for work out Today and tomorrow, can return Wed

## 2017-08-04 LAB — CULTURE, GROUP A STREP
MICRO NUMBER: 90212240
SPECIMEN QUALITY:: ADEQUATE

## 2017-08-04 LAB — STREP GROUP A AG, W/REFLEX TO CULT: Streptococcus, Group A Screen (Direct): NOT DETECTED

## 2017-08-05 ENCOUNTER — Telehealth: Payer: Self-pay | Admitting: *Deleted

## 2017-08-05 MED ORDER — AZITHROMYCIN 250 MG PO TABS: ORAL_TABLET | ORAL | 0 refills | Status: DC

## 2017-08-05 NOTE — Telephone Encounter (Signed)
If her job is Monday through Friday-- then go ahead and give her a note to cover being out through tomorrow 08/06/17. At this point -- is having ongoing fever and congestion--- would add antibiotic- Azithromycin 250 mg--- Day 1: take 2 daily.  Days 2 through 5:  take 1 daily.  #6+0 refill

## 2017-08-05 NOTE — Telephone Encounter (Signed)
Call placed to patient and patient made aware.   Prescription sent to pharmacy.  

## 2017-08-05 NOTE — Telephone Encounter (Signed)
Received call from patient.   Reports that she attempted to return to work today, but was sent home due to fever. States that she has been out of work since 07/30/2017 due to sx (fever, sore throat, cough). Noted flu/ strep swabs negative on 08/02/2017. Has had exposure to flu/ strep from daughter.   Advised to use OTC Mucinex or Robitussin for cough and nasal saline for congestion. Advised to increase rest and to increase fluid intake. Alternate IBU/ APAP for fever. Increase Vit C.   Please advise.

## 2017-11-10 ENCOUNTER — Ambulatory Visit: Payer: BC Managed Care – PPO | Admitting: Family Medicine

## 2017-11-10 ENCOUNTER — Encounter: Payer: Self-pay | Admitting: Family Medicine

## 2017-11-10 VITALS — BP 120/74 | HR 86 | Temp 98.6°F | Resp 16 | Ht 67.0 in

## 2017-11-10 DIAGNOSIS — S86001A Unspecified injury of right Achilles tendon, initial encounter: Secondary | ICD-10-CM | POA: Diagnosis not present

## 2017-11-10 DIAGNOSIS — M79661 Pain in right lower leg: Secondary | ICD-10-CM

## 2017-11-10 MED ORDER — HYDROCODONE-ACETAMINOPHEN 5-325 MG PO TABS: 1.0000 | ORAL_TABLET | Freq: Four times a day (QID) | ORAL | 0 refills | Status: AC | PRN

## 2017-11-10 MED ORDER — IBUPROFEN 800 MG PO TABS: 800.0000 mg | ORAL_TABLET | Freq: Three times a day (TID) | ORAL | 0 refills | Status: DC | PRN

## 2017-11-10 NOTE — Patient Instructions (Addendum)
Suspect tear or rupture to calf muscles and/or achillis   Stay completely nonweightbearing, use crutches  Pain meds and anti-inflammatories were sent to the pharmacy  Try and get you into ortho today  If you have any acute worsening of calf pain you need to go to the ER   Achilles Tendon Rupture The Achilles tendon is a cord-like band that connects the muscles of your lower leg (calf) to your heel. An Achilles tendon rupture is an injury that involves a tear in this tendon. This tendon is the most common site of tendon tearing. What are the causes? This condition may be caused by:  Stress from a sudden stretching of the tendon. For example, this may occur when you land from a jump or when your heel drops down into a hole on uneven ground.  A hard, direct hit to the tendon.  Pushing off your foot forcefully, such as when sprinting, jumping, or changing direction while running.  What increases the risk? This condition is more likely to develop in:  Runners.  People who play sports that involve sprinting, running, or jumping.  People who play contact sports.  People with a weak Achilles tendon. Tendons can weaken from aging, repeat injuries, and chronic tendinitis.  Males who are 2-9 years of age, especially those who do not exercise regularly.  What are the signs or symptoms? Symptoms of this condition include:  Hearing a "pop" at the time of injury.  Severe, sudden pain in the back of the ankle.  Swelling and bruising.  Inability to actively point your toes down.  Pain when standing or walking.  A feeling of giving way when you step on the affected side.  How is this diagnosed? This condition is usually diagnosed with a physical exam. During the exam, your health care provider may:  Touch the tendon and the structures around it.  Squeeze your calf to see if your foot moves.  Ask you to point and flex your foot.  Sometimes, tests are done in addition to an  exam. Tests may include:  An ultrasound.  An X-ray.  MRI.  How is this treated? This condition may be treated with:  Ice applied to the area.  Pain medicine.  Rest.  Crutches.  A cast, splint, or other device to keep the ankle from moving (keep it immobilized).  Heel wedges to reduce the stretch on your tendon as it heals.  Surgery. This option may depend on your age and your activity level.  Follow these instructions at home: If you have a splint or brace:  Do not put pressure on any part of the splint until it is fully hardened. This may take several hours.  Wear the splint or brace as told by your health care provider. Remove it only as told by your health care provider.  Loosen the splint or brace if your toes tingle, become numb, or turn cold and blue.  Do not let your splint or brace get wet if it is not waterproof.  Keep the splint or brace clean. If you have a cast:  Do not put pressure on any part of the cast until it is fully hardened. This may take several hours.  Do not stick anything inside the cast to scratch your skin. Doing that increases your risk of infection.  Check the skin around the cast every day. Tell your health care provider about any concerns.  You may put lotion on dry skin around the edges of the  cast. Do not put lotion on the skin underneath the cast.  Do not let your cast get wet if it is not waterproof.  Keep the cast clean. Bathing  Do not take baths, swim, or use a hot tub until your health care provider approves. Ask your health care provider if you can take showers. You may only be allowed to take sponge baths for bathing.  If your cast, splint, or brace is not waterproof, cover it with a watertight covering when you take a bath or a shower. Managing pain, stiffness, and swelling  If directed, apply ice to the injured area. ? Put ice in a plastic bag. ? Place a towel between your skin and the bag. ? Leave the ice on for  20 minutes, 2-3 times a day.  Move your toes often to avoid stiffness and to lessen swelling.  Raise (elevate) the injured area above the level of your heart while you are sitting or lying down. Do not dangle your leg over a chair, couch, or bed. Driving  Do not drive or operate heavy machinery while taking prescription pain medicine.  Ask your health care provider when it is safe to drive if you have a cast, splint, or brace on a leg or foot that you use for driving. Activity  Return to your normal activities as told by your health care provider. Ask your health care provider what activities are safe for you.  Do exercises only as told by your health care provider. General instructions  Do not use the injured limb to support your body weight until your health care provider says that you can. Use crutches as told by your health care provider.  Do not use any tobacco products, such as cigarettes, chewing tobacco, and e-cigarettes. Tobacco can delay bone healing. If you need help quitting, ask your health care provider.  Take over-the-counter and prescription medicines only as told by your health care provider.  Keep all follow-up visits as told by your health care provider. This is important. How is this prevented?  Warm up and stretch before being active.  Cool down and stretch after being active.  Give your body time to rest between periods of activity.  Make sure to use equipment that fits you.  Be safe and responsible while being active to avoid falls.  Each week, do at least 150 minutes of moderate-intensity exercise, such as brisk walking or water aerobics.  Spread your workouts over the whole week, instead of just working out intensely one or two days of the week.  Make slow, incremental changes in intensity, distance, or time for running or sporting activity.  Maintain physical fitness, including: ? Strength. ? Flexibility. ? Cardiovascular  fitness. ? Endurance. Contact a health care provider if:  Your pain and swelling increase.  Your pain is not controlled with medicines.  You develop new, unexplained symptoms.  Your symptoms get worse.  You cannot move your toes or foot.  You develop warmth and swelling in your foot.  You have an unexplained fever. This information is not intended to replace advice given to you by your health care provider. Make sure you discuss any questions you have with your health care provider. Document Released: 06/01/2005 Document Revised: 02/04/2016 Document Reviewed: 04/24/2015 Elsevier Interactive Patient Education  [employer].

## 2017-11-10 NOTE — Progress Notes (Signed)
Patient ID: Yvette Conley, female    DOB: 25-Jul-1985, 32 y.o.   MRN: [id number]  PCP: Salley Scarlet, MD  Chief Complaint  Patient presents with  . Right calf pain    Subjective:   Yvette Conley is a 32 y.o. female, presents to clinic with CC of 1 day of severe right calf and heel pain, onset yesterday while playing basketball after she had jumped and come down from taking a shot she felt a pop and sudden onset of pain with landing on her right foot.  Pain has gradually and rapidly worsened with firmness and swelling and pain in her calf.  She is able to move her foot and wiggle her toes but any dorsiflexion of her foot or walking and stretching of it exacerbates her pain severely.  She has no bruising noted to her calf or ankle.  She denies any ankle swelling.  No deformity.  She has been using crutches that she had at home from a past procedure.   She denies any redness over her right calf.  No radiation of pain into her medial or lateral ankle to foot or to the knee.  She was able to bear weight with a limp after her injury, but has been trying to stay off it ever since.  Pain severity is currently 7/10, last took OTC analgesics at 6 AM today.   Patient Active Problem List   Diagnosis Date Noted  . IBS (irritable bowel syndrome) 02/23/2017  . Back spasm 04/09/2014  . General medical examination 10/06/2011  . 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 Obesity 02/08/2011  . HEMORRHOIDS-INTERNAL 08/15/2010     Prior to Admission medications   Medication Sig Start Date End Date Taking? Authorizing Provider  cyclobenzaprine (FLEXERIL) 10 MG tablet Take 1 tablet (10 mg total) 3 (three) times daily as needed by mouth for muscle spasms. 04/28/17  Yes Shelton, Velna Hatchet, MD     Allergies  Allergen Reactions  . Latex Other (See Comments)    Reaction: burning  . Sulfa Antibiotics Hives     Family History  Problem Relation Age of Onset  . Crohn's disease Maternal Aunt   . Diabetes  Maternal Grandmother   . Heart disease Maternal Grandmother   . Kidney disease Maternal Grandmother   . Diverticulosis Maternal Grandmother   . Hypertension Maternal Grandmother   . Thyroid disease Maternal Grandmother   . Diabetes Mother   . Diabetes Father   . Diabetes Maternal Grandfather   . Hypertension Maternal Grandfather   . Heart disease Maternal Grandfather   . Hypertension Paternal Grandmother   . Diabetes Paternal Grandmother   . Hypertension Paternal Grandfather   . Diabetes Paternal Grandfather   . Diabetes Maternal Uncle      Social History   Socioeconomic History  . Marital status: Married    Spouse name: Not on file  . Number of children: Not on file  . Years of education: Not on file  . Highest education level: Not on file  Occupational History  . Occupation: Consulting civil engineer  Social Needs  . Financial resource strain: Not on file  . Food insecurity:    Worry: Not on file    Inability: Not on file  . Transportation needs:    Medical: Not on file    Non-medical: Not on file  Tobacco Use  . Smoking status: Never Smoker  . Smokeless tobacco: Never Used  Substance and Sexual Activity  . Alcohol use: No  .  Drug use: No  . Sexual activity: Yes    Birth control/protection: None  Lifestyle  . Physical activity:    Days per week: Not on file    Minutes per session: Not on file  . Stress: Not on file  Relationships  . Social connections:    Talks on phone: Not on file    Gets together: Not on file    Attends religious service: Not on file    Active member of club or organization: Not on file    Attends meetings of clubs or organizations: Not on file    Relationship status: Not on file  . Intimate partner violence:    Fear of current or ex partner: Not on file    Emotionally abused: Not on file    Physically abused: Not on file    Forced sexual activity: Not on file  Other Topics Concern  . Not on file  Social History Narrative   Daily caffeine use: 2  daily   No illicit Drug use     Review of Systems  Constitutional: Negative.   Musculoskeletal: Positive for gait problem and myalgias.  Skin: Negative for color change, pallor, rash and wound.  Neurological: Negative for weakness and numbness.  Hematological: Negative.   All other systems reviewed and are negative.      Objective:    Vitals:   11/10/17 1142  BP: 120/74  Pulse: 86  Resp: 16  Temp: 98.6 F (37 C)  TempSrc: Oral  SpO2: 98%  Height:  (1.702 m)      Physical Exam  Constitutional: She appears well-developed and well-nourished. No distress.  HENT:  Head: Normocephalic and atraumatic.  Nose: Nose normal.  Eyes: Conjunctivae are normal. Right eye exhibits no discharge. Left eye exhibits no discharge.  Neck: No tracheal deviation present.  Cardiovascular: Normal rate and regular rhythm.  Pulmonary/Chest: Effort normal. No stridor. No respiratory distress.  Musculoskeletal:       Right ankle: She exhibits decreased range of motion. She exhibits no swelling, no ecchymosis, no deformity and normal pulse. No lateral malleolus and no medial malleolus tenderness found. Achilles tendon exhibits pain and abnormal Thompson's test results. Achilles tendon exhibits no defect.       Right lower leg: She exhibits tenderness and swelling. She exhibits no bony tenderness, no deformity and no laceration.       Legs:      Right foot: There is bony tenderness. There is normal range of motion, no tenderness, no swelling, normal capillary refill, no crepitus and no deformity.  Muscle compartment of right calf firmer and larger than left Tenderness to palpation of right calf - see area of pain marked on image Severe pain with thompson's test The skin over right calf has no induration or erythema Achilles tendon is palpated on the right, is painful to palpation, possibly feels more narrow on the right than left Achilles tendon which is nontender & without defect Cannot  dorsiflex right ankle Can plantarflex right ankle, pain with flexion against resistance Right bony tenderness over calcaneus bone   Neurological: She is alert. No sensory deficit. She exhibits normal muscle tone. Gait abnormal. Coordination normal.  Skin: Skin is warm, dry and intact. Capillary refill takes less than 2 seconds. No bruising, no ecchymosis, no laceration, no petechiae and no rash noted. She is not diaphoretic. No cyanosis. No pallor. Nails show no clubbing.  Psychiatric: She has a normal mood and affect. Her behavior is normal.  Nursing  note and vitals reviewed.          Assessment & Plan:      ICD-10-CM   1. Injury of right Achilles tendon, initial encounter S86.001A Apply wrap    Ambulatory referral to Orthopedic Surgery    HYDROcodone-acetaminophen (NORCO/VICODIN) 5-325 MG tablet    ibuprofen (ADVIL,MOTRIN) 800 MG tablet    PR LT COMPRES BAND >=3 <5/YD  2. Right calf pain M79.661 Apply wrap    HYDROcodone-acetaminophen (NORCO/VICODIN) 5-325 MG tablet    ibuprofen (ADVIL,MOTRIN) 800 MG tablet    PR LT COMPRES BAND >=3 <5/YD    Right heel and calf pain - suspect tear/rupture to achillis/soleus + thompson test, calf enlarged, muscles feel slightly firm to palpation with ttp, cannot dorsiflex right foot. Onset yesterday playing basketball, coming down from jump shot, felt pop and immediate pain to calf/heel No bony tenderness on exam except over calcaneus bone, calcaneal tendon and diffusely to right calf. Feel with firmness of calf, history and exam patient should see Ortho as soon as possible further evaluation.  I do not want to delay this so will defer x-rays to ortho.  I have crutches, she was instructed to completely avoid any weightbearing and use crutches until seen by ortho.  She does have a boot at home, which explained she can put on her foot however she does need to have some plantarflexion.  In office figure 8 with a Ace wrap was applied to her ankle to try  and cushion and limit mobility.  If she cannot get an Ortho today she is instructed to go to [facility] urgent care or [facility].  Reviewed concerning signs and symptoms of compartment syndrome and instructed her to go to the ER if she has any acute worsening or severe pain.  Given narcotic pain medicine and 800 mg ibuprofen, urgent Ortho referral entered and discussed with referral coordinator.  Patient verbalized agreement with an understanding of plan.  Danelle Berry, PA-C 11/10/17 12:05 PM

## 2017-12-02 ENCOUNTER — Emergency Department (HOSPITAL_COMMUNITY): Payer: BC Managed Care – PPO

## 2017-12-02 ENCOUNTER — Emergency Department (HOSPITAL_COMMUNITY)
Admission: EM | Admit: 2017-12-02 | Discharge: 2017-12-02 | Disposition: A | Discharge status: Home / Self Care | Payer: BC Managed Care – PPO | Attending: Emergency Medicine | Admitting: Emergency Medicine

## 2017-12-02 ENCOUNTER — Encounter (HOSPITAL_COMMUNITY): Payer: Self-pay | Admitting: Emergency Medicine

## 2017-12-02 ENCOUNTER — Encounter: Payer: Self-pay | Admitting: Family Medicine

## 2017-12-02 ENCOUNTER — Ambulatory Visit: Payer: BC Managed Care – PPO | Admitting: Family Medicine

## 2017-12-02 ENCOUNTER — Other Ambulatory Visit: Payer: Self-pay

## 2017-12-02 VITALS — BP 124/62 | HR 82 | Temp 98.1°F | Resp 14 | Ht 67.0 in | Wt 250.0 lb

## 2017-12-02 DIAGNOSIS — R51 Headache: Secondary | ICD-10-CM | POA: Insufficient documentation

## 2017-12-02 DIAGNOSIS — H538 Other visual disturbances: Secondary | ICD-10-CM | POA: Diagnosis not present

## 2017-12-02 DIAGNOSIS — R29810 Facial weakness: Secondary | ICD-10-CM | POA: Diagnosis not present

## 2017-12-02 DIAGNOSIS — Z79899 Other long term (current) drug therapy: Secondary | ICD-10-CM | POA: Diagnosis not present

## 2017-12-02 DIAGNOSIS — Q670 Congenital facial asymmetry: Secondary | ICD-10-CM | POA: Diagnosis not present

## 2017-12-02 DIAGNOSIS — R200 Anesthesia of skin: Secondary | ICD-10-CM | POA: Diagnosis present

## 2017-12-02 DIAGNOSIS — H5712 Ocular pain, left eye: Secondary | ICD-10-CM | POA: Diagnosis not present

## 2017-12-02 LAB — CBC WITH DIFFERENTIAL/PLATELET
BASOS PCT: 0 %
BASOS PCT: 0 %
Basophils Absolute: 0 10*3/uL (ref 0.0–0.1)
Basophils Absolute: 0 10*3/uL (ref 0.0–0.1)
EOS ABS: 0.1 10*3/uL (ref 0.0–0.7)
EOS ABS: 0 10*3/uL (ref 0.0–0.7)
Eosinophils Relative: 1 %
Eosinophils Relative: 1 %
HEMATOCRIT: 35.1 % — AB (ref 36.0–46.0)
HEMATOCRIT: 34.7 % — AB (ref 36.0–46.0)
HEMOGLOBIN: 11.6 g/dL — AB (ref 12.0–15.0)
HEMOGLOBIN: 11.4 g/dL — AB (ref 12.0–15.0)
LYMPHS ABS: 2.6 10*3/uL (ref 0.7–4.0)
Lymphocytes Relative: 29 %
Lymphocytes Relative: 30 %
Lymphs Abs: 2.6 10*3/uL (ref 0.7–4.0)
MCH: 30.4 pg (ref 26.0–34.0)
MCH: 30.6 pg (ref 26.0–34.0)
MCHC: 33 g/dL (ref 30.0–36.0)
MCHC: 32.9 g/dL (ref 30.0–36.0)
MCV: 92.1 fL (ref 78.0–100.0)
MCV: 93 fL (ref 78.0–100.0)
MONO ABS: 0.6 10*3/uL (ref 0.1–1.0)
MONOS PCT: 7 %
MONOS PCT: 7 %
Monocytes Absolute: 0.6 10*3/uL (ref 0.1–1.0)
NEUTROS ABS: 5.6 10*3/uL (ref 1.7–7.7)
NEUTROS PCT: 63 %
NEUTROS PCT: 62 %
Neutro Abs: 5.7 10*3/uL (ref 1.7–7.7)
Platelets: 349 10*3/uL (ref 150–400)
Platelets: 329 10*3/uL (ref 150–400)
RBC: 3.81 MIL/uL — ABNORMAL LOW (ref 3.87–5.11)
RBC: 3.73 MIL/uL — AB (ref 3.87–5.11)
RDW: 12.4 % (ref 11.5–15.5)
RDW: 12.3 % (ref 11.5–15.5)
WBC: 8.9 10*3/uL (ref 4.0–10.5)
WBC: 8.8 10*3/uL (ref 4.0–10.5)

## 2017-12-02 LAB — COMPREHENSIVE METABOLIC PANEL
ALK PHOS: 49 U/L (ref 38–126)
ALT: 16 U/L (ref 14–54)
ANION GAP: 9 (ref 5–15)
AST: 13 U/L — ABNORMAL LOW (ref 15–41)
Albumin: 3.9 g/dL (ref 3.5–5.0)
BUN: 8 mg/dL (ref 6–20)
CALCIUM: 9.7 mg/dL (ref 8.9–10.3)
CO2: 27 mmol/L (ref 22–32)
Chloride: 106 mmol/L (ref 101–111)
Creatinine, Ser: 0.62 mg/dL (ref 0.44–1.00)
GFR calc non Af Amer: >60 mL/min (ref >60)
Glucose, Bld: 87 mg/dL (ref 65–99)
Potassium: 3.8 mmol/L (ref 3.5–5.1)
SODIUM: 142 mmol/L (ref 135–145)
TOTAL PROTEIN: 7.7 g/dL (ref 6.5–8.1)
Total Bilirubin: 0.6 mg/dL (ref 0.3–1.2)

## 2017-12-02 LAB — RAPID URINE DRUG SCREEN, HOSP PERFORMED
Amphetamines: NOT DETECTED
Benzodiazepines: NOT DETECTED
COCAINE: NOT DETECTED
Opiates: NOT DETECTED
Tetrahydrocannabinol: NOT DETECTED

## 2017-12-02 LAB — MAGNESIUM: Magnesium: 1.9 mg/dL (ref 1.7–2.4)

## 2017-12-02 MED ORDER — GADOBENATE DIMEGLUMINE 529 MG/ML IV SOLN
20.0000 mL | Freq: Once | INTRAVENOUS | Status: AC | PRN
  Administered 2017-12-02: 20 mL via INTRAVENOUS

## 2017-12-02 MED ORDER — ACETAMINOPHEN 325 MG PO TABS
650.0000 mg | ORAL_TABLET | Freq: Once | ORAL | Status: AC
  Administered 2017-12-02: 650 mg via ORAL
  Filled 2017-12-02: qty 2

## 2017-12-02 MED ORDER — ONDANSETRON 4 MG PO TBDP
4.0000 mg | ORAL_TABLET | Freq: Once | ORAL | Status: AC
  Administered 2017-12-02: 4 mg via ORAL
  Filled 2017-12-02: qty 1

## 2017-12-02 NOTE — ED Notes (Signed)
Lab system and Epic system are not communicating for this patient. Lab says they can run it over again even though it shows in their system. They will call me with any problems.

## 2017-12-02 NOTE — ED Provider Notes (Signed)
The patient still has a headache, denies visual symptoms at this time, she was given all of her results including MRI and the lab work, this was reassuring showing no signs of acute findings.  She is stable for discharge to follow-up with your family doctor.  She is willing to do this and agreeable to return should her symptoms worsen   Eber HongMiller, Yoshito Gaza, MD 12/02/17 1836

## 2017-12-02 NOTE — ED Triage Notes (Signed)
[facility] right sided facial numbness x 3 days with difficulty swallowing. Denies pain. Reports unable to close right eye. Dr office stated that they suspect Bell's Palsy but wanted transport here for further evaluation.

## 2017-12-02 NOTE — Discharge Instructions (Signed)
Please take your ibuprofen, 800 mg 3 times a day for your headache and follow-up with your family doctor.  Your MRI and blood work was normal and reassuring.  Return to the emergency department for severe or worsening symptoms

## 2017-12-02 NOTE — ED Notes (Signed)
Patient transported to MRI 

## 2017-12-02 NOTE — Progress Notes (Signed)
Patient ID: Yvette Conley, female    DOB: [birth year], 32 y.o.   MRN: [id number]  PCP: Salley Scarlet, MD  Chief Complaint  Patient presents with  . Facial Edema    x3 days- edema to R side of face, twitches to facial nerves/ eye, states that mouth doesn't feel the same and she is having difficulty eating    Subjective:   Yvette Conley is a 32 y.o. female, presents to clinic with CC of facial numbness, asymmetry, vision changes, twitching eyes, unable to close right eye, decreased vision worse in the left and severe HA .   Onset of sx:  11/30/17 at 2-3 pm Duration:  Constant until now Location:  To face, eyes, mouth, jaw, neck Characteristics: numbness tingling +Dysphagia  She initially felt like she had some numbness and tingling in her lips and swelling to the right side of her face that also felt numb, she did take Benadryl for this and she had some mild improvement but she had difficulty swallowing, was choking on food and vomiting it back up.  She is also had twitching in her eyes and twitching in her toes.  She feels like her vision in her left eye is extremely blurry, her right eye is drooping and will not close all the way.  Twitches are much worse in her right eye than her left.  She has an associated severe headache located to the back of her head.   She denies any fever, syncope, difficulty with balance, numbness or weakness in any of her extremities. No known recent illnesses.   Patient Active Problem List   Diagnosis Date Noted  . IBS (irritable bowel syndrome) 02/23/2017  . Back spasm 04/09/2014  . General medical examination 10/06/2011  . 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 Obesity 02/08/2011  . HEMORRHOIDS-INTERNAL 08/15/2010     Prior to Admission medications   Medication Sig Start Date End Date Taking? Authorizing Provider  cyclobenzaprine (FLEXERIL) 10 MG tablet Take 1 tablet (10 mg total) 3 (three) times daily as needed by mouth for muscle spasms.  04/28/17  Yes [place], Velna Hatchet, MD  ibuprofen (ADVIL,MOTRIN) 800 MG tablet Take 1 tablet (800 mg total) by mouth every 8 (eight) hours as needed. 11/10/17  Yes Danelle Berry, PA-C     Allergies  Allergen Reactions  . Latex Other (See Comments)    Reaction: burning  . Sulfa Antibiotics Hives     Family History  Problem Relation Age of Onset  . Crohn's disease Maternal Aunt   . Diabetes Maternal Grandmother   . Heart disease Maternal Grandmother   . Kidney disease Maternal Grandmother   . Diverticulosis Maternal Grandmother   . Hypertension Maternal Grandmother   . Thyroid disease Maternal Grandmother   . Diabetes Mother   . Diabetes Father   . Diabetes Maternal Grandfather   . Hypertension Maternal Grandfather   . Heart disease Maternal Grandfather   . Hypertension Paternal Grandmother   . Diabetes Paternal Grandmother   . Hypertension Paternal Grandfather   . Diabetes Paternal Grandfather   . Diabetes Maternal Uncle      Social History   Socioeconomic History  . Marital status: Married    Spouse name: Not on file  . Number of children: Not on file  . Years of education: Not on file  . Highest education level: Not on file  Occupational History  . Occupation: Consulting civil engineer  Social Needs  . Financial resource strain: Not on file  .  Food insecurity:    Worry: Not on file    Inability: Not on file  . Transportation needs:    Medical: Not on file    Non-medical: Not on file  Tobacco Use  . Smoking status: Never Smoker  . Smokeless tobacco: Never Used  Substance and Sexual Activity  . Alcohol use: No  . Drug use: No  . Sexual activity: Yes    Birth control/protection: None  Lifestyle  . Physical activity:    Days per week: Not on file    Minutes per session: Not on file  . Stress: Not on file  Relationships  . Social connections:    Talks on phone: Not on file    Gets together: Not on file    Attends religious service: Not on file    Active member of club or  organization: Not on file    Attends meetings of clubs or organizations: Not on file    Relationship status: Not on file  . Intimate partner violence:    Fear of current or ex partner: Not on file    Emotionally abused: Not on file    Physically abused: Not on file    Forced sexual activity: Not on file  Other Topics Concern  . Not on file  Social History Narrative   Daily caffeine use: 2 daily   No illicit Drug use     Review of Systems  Unable to perform ROS: Other       Objective:    Vitals:   12/02/17 1051  BP: 124/62  Pulse: 82  Resp: 14  Temp: 98.1 F (36.7 C)  TempSrc: Oral  SpO2: 97%  Weight: 250 lb (113.4 kg)  Height: 5\' 7"  (1.702 m)      Physical Exam  Constitutional: She is oriented to person, place, and time. She appears well-developed.  Patient appears ill, is anxious and tearful, nontoxic-appearing, appears stated age, obese  HENT:  Head: Normocephalic and atraumatic. Head is without raccoon's eyes, without Battle's sign, without right periorbital erythema and without left periorbital erythema.  Right Ear: Hearing and external ear normal.  Left Ear: Hearing and external ear normal.  Nose: Nose normal.  Mouth/Throat: Uvula is midline, oropharynx is clear and moist and mucous membranes are normal.  Eyes: Pupils are equal, round, and reactive to light. Conjunctivae are normal. Right eye exhibits abnormal extraocular motion and nystagmus. Left eye exhibits nystagmus.  Right ptosis  Neck: Normal range of motion and full passive range of motion without pain. Neck supple. No tracheal deviation present.  Slightly slurred speech, will speech patterns  Cardiovascular: Normal rate, regular rhythm and normal pulses.  Pulmonary/Chest: Effort normal. No stridor. No respiratory distress. She has no decreased breath sounds. She has no wheezes. She has no rhonchi. She has no rales.  Abdominal: Normal appearance.  Musculoskeletal: Normal range of motion.    Neurological: She is alert and oriented to person, place, and time. She exhibits normal muscle tone. Coordination normal.  MENTAL STATUS: AAOx3, memory intact, fund of knowledge appropriate  LANG/SPEECH: Speech slightly slurred, short fluid sentences  CRANIAL NERVES:   II: Pupils equal and reactive, no RAPD   III, IV, VI: EOM abnormal, with nystagmus, no gaze preference or deviation Cannot close right eye, but normal right eyebrow raise, higher than on the left   V: normal sensation in V1, V2, and V3 segments bilaterally   VII: asymmetry to face, nasolabial fold -light asymmetry, cannot puff out cheeks with some  VIII: normal hearing to speech   IX, X: normal palatal elevation, no uvular deviation   XI: 5/5 head turn and 5/5 shoulder shrug bilaterally   XII: midline tongue protrusion  MOTOR:  5/5 bilateral grip strength 5/5 strength dorsiflexion/plantarflexion b/l  SENSORY:  Normal to light touch in all extremities  COORD: failed finger nose, slight tremor  STATION: normal stance, no truncal ataxia  GAIT: Normal  Skin: Skin is warm and dry. No rash noted.  Psychiatric: She has a normal mood and affect. Her behavior is normal.  Nursing note and vitals reviewed.         Assessment & Plan:      ICD-10-CM   1. Facial asymmetry Q67.0    to ER for r/o stroke bleed, may be atypical bells palsy    Concern with a facial asymmetry and neurological symptoms, may be Bell's palsy however exam was not 100% consistent with Bell's palsy, I was concerned with dysphagia sx, left eyebrow spared - feel ER eval required. CT to r/o bleed, suspect pt will need MRI (although she was told this does depend on the more indepth neuro eval at the hospital, whether or not they will proceed with MRI) Patient understands emergency room is for ruling out concerning emergencies and with a negative work-up that it is more reassuring that we can treat her for Bell's palsy I discussed this with Dr.  Tanya Nones who is here in clinic, he did evaluate her and did feel fairly strongly that was Bell's palsy.  Patient and I had a additional discussion of both of our levels of comfort, and she will proceed to the ER via EMS.  Her husband has come to clinic and I have discussed everything with him, all questions been answered.  Back up plan for negative ER workup would be treatment for bells palsy Valtrx 1000 mg TID x 7 d Pred 60 mg daily x 7 d  Will follow ER workup and pt to follow up here regardless of findings  Danelle Berry, PA-C 12/02/17 11:25 AM
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
## 2017-12-02 NOTE — ED Notes (Signed)
MRI called and there will be a delay with patient

## 2017-12-03 ENCOUNTER — Encounter: Payer: Self-pay | Admitting: Family Medicine

## 2017-12-07 ENCOUNTER — Ambulatory Visit: Payer: BC Managed Care – PPO | Admitting: Diagnostic Neuroimaging

## 2017-12-07 ENCOUNTER — Ambulatory Visit: Payer: BC Managed Care – PPO | Admitting: Family Medicine

## 2017-12-07 ENCOUNTER — Encounter: Payer: Self-pay | Admitting: Diagnostic Neuroimaging

## 2017-12-07 VITALS — BP 117/74 | HR 100 | Ht 67.0 in | Wt 254.0 lb

## 2017-12-07 DIAGNOSIS — G510 Bell's palsy: Secondary | ICD-10-CM | POA: Diagnosis not present
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
## 2017-12-07 NOTE — Patient Instructions (Signed)
-   complete prednisone and valacyclovir course - continue left eye care (artificial tears; patching)

## 2017-12-08 ENCOUNTER — Encounter: Payer: Self-pay | Admitting: Family Medicine

## 2017-12-08 ENCOUNTER — Ambulatory Visit: Payer: BC Managed Care – PPO | Admitting: Family Medicine

## 2017-12-08 VITALS — BP 110/60 | HR 86 | Temp 98.5°F | Resp 16 | Ht 67.0 in | Wt 252.0 lb

## 2017-12-08 DIAGNOSIS — G510 Bell's palsy: Secondary | ICD-10-CM

## 2017-12-08 MED ORDER — PROPYLENE GLYCOL-GLYCERIN 0.6-0.6 % OP SOLN: 2.0000 [drp] | Freq: Two times a day (BID) | OPHTHALMIC | 0 refills | Status: DC

## 2017-12-08 NOTE — Patient Instructions (Addendum)
Continue eye care - call us right away if any new left eye pain or redness.  I will touch base with you Sunday morning to see if we need to add more steroids  Call the after hours if you have any new or worsening symptoms.   Bell Palsy, Adult Bell palsy is a short-term inability to move muscles in part of the face. The inability to move (paralysis) results from inflammation or compression of the facial nerve, which travels along the skull and under the ear to the side of the face (7th cranial nerve). This nerve is responsible for facial movements that include blinking, closing the eyes, smiling, and frowning. What are the causes? The exact cause of this condition is not known. It may be caused by an infection from a virus, such as the chickenpox (herpes zoster), Epstein-Barr, or mumps virus. What increases the risk? You are more likely to develop this condition if:  You are pregnant.  You have diabetes.  You have had a recent infection in your nose, throat, or airways (upper respiratory infection).  You have a weakened body defense system (immune system).  You have had a facial injury, such as a fracture.  You have a family history of Bell palsy.  What are the signs or symptoms? Symptoms of this condition include:  Weakness on one side of the face.  Drooping eyelid and corner of the mouth.  Excessive tearing in one eye.  Difficulty closing the eyelid.  Dry eye.  Drooling.  Dry mouth.  Changes in taste.  Change in facial appearance.  Pain behind one ear.  Ringing in one or both ears.  Sensitivity to sound in one ear.  Facial twitching.  Headache.  Impaired speech.  Dizziness.  Difficulty eating or drinking.  Most of the time, only one side of the face is affected. Rarely, Bell palsy affects the whole face. How is this diagnosed? This condition is diagnosed based on:  Your symptoms.  Your medical history.  A physical exam.  You may also have to  see health care providers who specialize in disorders of the nerves (neurologist) or diseases and conditions of the eye (ophthalmologist). You may have tests, such as:  A test to check for nerve damage (electromyogram).  Imaging studies, such as CT or MRI scans.  Blood tests.  How is this treated? This condition affects every person differently. Sometimes symptoms go away without treatment within a couple weeks. If treatment is needed, it varies from person to person. The goal of treatment is to reduce inflammation and protect the eye from damage. Treatment for Bell palsy may include:  Medicines, such as: ? Steroids to reduce swelling and inflammation. ? Antiviral drugs. ? Pain relievers, including aspirin, acetaminophen, or ibuprofen.  Eye drops or ointment to keep your eye moist.  Eye protection, if you cannot close your eye.  Exercises or massage to regain muscle strength and function (physical therapy).  Follow these instructions at home:  Take over-the-counter and prescription medicines only as told by your health care provider.  If your eye is affected: ? Keep your eye moist with eye drops or ointment as told by your health care provider. ? Follow instructions for eye care and protection as told by your health care provider.  Do any physical therapy exercises as told by your health care provider.  Keep all follow-up visits as told by your health care provider. This is important. Contact a health care provider if:  You have a fever.  Your symptoms do not get better within 2-3 weeks, or your symptoms get worse.  Your eye is red, irritated, or painful.  You have new symptoms. Get help right away if:  You have weakness or numbness in a part of your body other than your face.  You have trouble swallowing.  You develop neck pain or stiffness.  You develop dizziness or shortness of breath. Summary  Bell palsy is a short-term inability to move muscles in part of the  face. The inability to move (paralysis) results from inflammation or compression of the facial nerve.  This condition affects every person differently. Sometimes symptoms go away without treatment within a couple weeks.  If treatment is needed, it varies from person to person. The goal of treatment is to reduce inflammation and protect the eye from damage.  Contact your health care provider if your symptoms do not get better within 2-3 weeks, or your symptoms get worse. This information is not intended to replace advice given to you by your health care provider. Make sure you discuss any questions you have with your health care provider. Document Released: 06/01/2005 Document Revised: 08/04/2016 Document Reviewed: 08/04/2016 Elsevier Interactive Patient Education  [employer]2018 [employer] Inc[employer]

## 2017-12-08 NOTE — Progress Notes (Signed)
Patient ID: Yvette Conley, female    DOB: 03/30/1986, 32 y.o.   MRN: [id number]  PCP: Salley Scarlet, MD  Chief Complaint  Patient presents with  . Follow-up    Subjective:   Yvette Conley is a 32 y.o. female, presents to clinic with CC of Bells Palsy recheck.  She was seen in clinic 6 days ago, sent to the ER for further work-up.  Was dc'd after negative MRI.  I followed case and Rx'd  Prednisone and valtrex Per last note and plan.  She did follow up with neurology and they advised finishing week of meds and f/up as needed.  She is patching her eye at night and using drops during the day.  Her facial droop has improved but she still has some paralysis of left side of face.  Vision is improving and twitches are significantly improved.  She denies any new or worsening sx.  She has no new eye pain or redness.  Headache has resolved. She is tolerating meds w/o any side effects.     Patient Active Problem List   Diagnosis Date Noted  . IBS (irritable bowel syndrome) 02/23/2017  . Back spasm 04/09/2014  . General medical examination 10/06/2011  . 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 Obesity 02/08/2011  . HEMORRHOIDS-INTERNAL 08/15/2010     Prior to Admission medications   Medication Sig Start Date End Date Taking? Authorizing Provider  cyclobenzaprine (FLEXERIL) 10 MG tablet Take 1 tablet (10 mg total) 3 (three) times daily as needed by mouth for muscle spasms. 04/28/17  Yes [place], Velna Hatchet, MD  diphenhydrAMINE (BENADRYL) 12.5 MG/5ML liquid Take by mouth 4 (four) times daily as needed.   Yes [provider]  HYDROcodone-acetaminophen (NORCO/VICODIN) 5-325 MG tablet Take 1 tablet by mouth every 6 (six) hours as needed for moderate pain.   Yes [provider]  ibuprofen (ADVIL,MOTRIN) 800 MG tablet Take 1 tablet (800 mg total) by mouth every 8 (eight) hours as needed. 11/10/17  Yes Danelle Berry, PA-C  Naphazoline-Glycerin (CLEAR EYES COOLING COMFORT OP) Apply 1 drop  to eye daily as needed (dryness).   Yes [provider]  predniSONE (DELTASONE) 20 MG tablet  12/03/17  Yes [provider]  valACYclovir (VALTREX) 1000 MG tablet  12/03/17  Yes [provider]  Propylene Glycol-Glycerin (SOOTHE) 0.6-0.6 % SOLN Apply 2 drops to eye 2 (two) times daily. 12/08/17   Danelle Berry, PA-C     Allergies  Allergen Reactions  . Latex Other (See Comments)    Reaction: burning   . Sulfa Antibiotics Hives    Torso, chest areas     Family History  Problem Relation Age of Onset  . Crohn's disease Maternal Aunt   . Diabetes Maternal Grandmother   . Heart disease Maternal Grandmother   . Kidney disease Maternal Grandmother   . Diverticulosis Maternal Grandmother   . Hypertension Maternal Grandmother   . Thyroid disease Maternal Grandmother   . Diabetes Mother   . Diabetes Father   . Diabetes Maternal Grandfather   . Hypertension Maternal Grandfather   . Heart disease Maternal Grandfather   . Hypertension Paternal Grandmother   . Diabetes Paternal Grandmother   . Hypertension Paternal Grandfather   . Diabetes Paternal Grandfather   . Diabetes Maternal Uncle      Social History   Socioeconomic History  . Marital status: Married    Spouse name: Not on file  . Number of children: Not on file  .  Years of education: Not on file  . Highest education level: Not on file  Occupational History  . Occupation: Consulting civil engineer  Social Needs  . Financial resource strain: Not on file  . Food insecurity:    Worry: Not on file    Inability: Not on file  . Transportation needs:    Medical: Not on file    Non-medical: Not on file  Tobacco Use  . Smoking status: Never Smoker  . Smokeless tobacco: Never Used  Substance and Sexual Activity  . Alcohol use: No  . Drug use: No  . Sexual activity: Yes    Birth control/protection: None  Lifestyle  . Physical activity:    Days per week: Not on file    Minutes per session: Not on file  . Stress:  Not on file  Relationships  . Social connections:    Talks on phone: Not on file    Gets together: Not on file    Attends religious service: Not on file    Active member of club or organization: Not on file    Attends meetings of clubs or organizations: Not on file    Relationship status: Not on file  . Intimate partner violence:    Fear of current or ex partner: Not on file    Emotionally abused: Not on file    Physically abused: Not on file    Forced sexual activity: Not on file  Other Topics Concern  . Not on file  Social History Narrative   Daily caffeine use: 2 daily   No illicit Drug use     Review of Systems  Constitutional: Negative.   HENT: Negative.   Eyes: Negative.   Respiratory: Negative.   Cardiovascular: Negative.   Gastrointestinal: Negative.   Endocrine: Negative.   Genitourinary: Negative.   Musculoskeletal: Negative.   Skin: Negative.   Allergic/Immunologic: Negative.   All other systems reviewed and are negative.      Objective:    Vitals:   12/08/17 1105  BP: 110/60  Pulse: 86  Resp: 16  Temp: 98.5 F (36.9 C)  TempSrc: Oral  SpO2: 99%  Weight: 252 lb (114.3 kg)  Height: 5\' 7"  (1.702 m)      Physical Exam  Constitutional: She is oriented to person, place, and time. She appears well-developed and well-nourished. No distress.  HENT:  Head: Normocephalic and atraumatic.  Nose: Nose normal.  Mouth/Throat: Oropharynx is clear and moist. No oropharyngeal exudate.  Eyes: Pupils are equal, round, and reactive to light. Conjunctivae and EOM are normal. Right eye exhibits no chemosis and no discharge. Left eye exhibits no chemosis and no discharge. Right conjunctiva is not injected. Right conjunctiva has no hemorrhage. Left conjunctiva is not injected. Left conjunctiva has no hemorrhage. No scleral icterus. Right eye exhibits no nystagmus. Left eye exhibits no nystagmus.  Neck: Normal range of motion. No tracheal deviation present.    Cardiovascular: Normal rate and regular rhythm.  Pulmonary/Chest: Effort normal. No respiratory distress.  Musculoskeletal: Normal range of motion.  Right ankle boot  Neurological: She is alert and oriented to person, place, and time. She has normal strength. She displays no tremor. A cranial nerve deficit is present. She exhibits normal muscle tone. Coordination and gait normal.  Mild facial asymmetry, left side of face with subtle droop of eye, decreased left eye closure, decreased left eyebrow raise, decreased facial strength w/ smile and cannot puff out cheeks on left side   Normal EOM's no nystagmus  Skin:  Skin is warm and dry. No rash noted. She is not diaphoretic.  Psychiatric: She has a normal mood and affect. Her behavior is normal.  Nursing note and vitals reviewed.         Assessment & Plan:  32 y/o female dx with Bells palsy 6 days ago, has been taking prednisone 60 mg q d and valtrex 1000 mg TID, I rx'd meds 12/03/17, pt states she started them Saturday morning, has 2 more days of meds.    Eval by Neurology, Dr. Marjory LiesPenumalli, yesterday, note reviewed, confirms Dx left bells palsy    ICD-10-CM   1. Bell's palsy G51.0    Improving Finish prednisone and valtrex as prescribed, f/up if she has any worsening of sx with completion of meds. Discussed eye care, pt to contact us immediately with any redness, discharge or left eye pain, would need ophtho eval.  Pt verbalizes f/up precautions.   Danelle BerryLeisa Abanoub Hanken, PA-C 12/08/17 11:36 AM

## 2017-12-21 ENCOUNTER — Ambulatory Visit (HOSPITAL_COMMUNITY): Payer: BC Managed Care – PPO | Attending: Student

## 2017-12-21 DIAGNOSIS — M25571 Pain in right ankle and joints of right foot: Secondary | ICD-10-CM | POA: Diagnosis present

## 2017-12-21 DIAGNOSIS — M25671 Stiffness of right ankle, not elsewhere classified: Secondary | ICD-10-CM
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
## 2017-12-27 ENCOUNTER — Telehealth (HOSPITAL_COMMUNITY): Payer: Self-pay

## 2017-12-27 NOTE — Telephone Encounter (Signed)
Patient called to cancel all of her July appt because she did'nt have money for copay I told her that she did'nt have to have it that we would bill her so she is keeping her appts and wil pay at the end of the month.

## 2017-12-28 ENCOUNTER — Encounter (HOSPITAL_COMMUNITY): Payer: Self-pay

## 2017-12-28 ENCOUNTER — Ambulatory Visit (HOSPITAL_COMMUNITY): Payer: BC Managed Care – PPO

## 2017-12-28 DIAGNOSIS — M25571 Pain in right ankle and joints of right foot: Secondary | ICD-10-CM

## 2017-12-28 DIAGNOSIS — M25671 Stiffness of right ankle, not elsewhere classified: Secondary | ICD-10-CM
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
## 2018-01-03 ENCOUNTER — Other Ambulatory Visit: Payer: Self-pay | Admitting: Family Medicine

## 2018-01-03 MED ORDER — CYCLOBENZAPRINE HCL 10 MG PO TABS: 10.0000 mg | ORAL_TABLET | Freq: Three times a day (TID) | ORAL | 0 refills | Status: DC | PRN

## 2018-01-03 NOTE — Telephone Encounter (Signed)
Ok to refill 

## 2018-01-04 ENCOUNTER — Telehealth (HOSPITAL_COMMUNITY): Payer: Self-pay

## 2018-01-04 ENCOUNTER — Ambulatory Visit (HOSPITAL_COMMUNITY): Payer: BC Managed Care – PPO

## 2018-01-04 NOTE — Telephone Encounter (Signed)
She is not feeling well today and can not come in. NF 01/04/18

## 2018-01-11 ENCOUNTER — Ambulatory Visit (HOSPITAL_COMMUNITY): Payer: BC Managed Care – PPO

## 2018-01-11 ENCOUNTER — Encounter (HOSPITAL_COMMUNITY): Payer: Self-pay

## 2018-01-11 ENCOUNTER — Other Ambulatory Visit: Payer: Self-pay

## 2018-01-11 DIAGNOSIS — M25671 Stiffness of right ankle, not elsewhere classified: Secondary | ICD-10-CM

## 2018-01-11 DIAGNOSIS — M25571 Pain in right ankle and joints of right foot: Secondary | ICD-10-CM | POA: Diagnosis not present
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
## 2018-01-17 ENCOUNTER — Telehealth (HOSPITAL_COMMUNITY): Payer: Self-pay | Admitting: Family Medicine

## 2018-01-17 NOTE — Telephone Encounter (Signed)
01/17/18  has training that she is supposed to attend

## 2018-01-18 ENCOUNTER — Ambulatory Visit (HOSPITAL_COMMUNITY): Payer: BC Managed Care – PPO

## 2018-01-19 ENCOUNTER — Ambulatory Visit (HOSPITAL_COMMUNITY): Payer: BC Managed Care – PPO | Attending: Student

## 2018-01-19 ENCOUNTER — Encounter (HOSPITAL_COMMUNITY): Payer: Self-pay

## 2018-01-19 DIAGNOSIS — M25571 Pain in right ankle and joints of right foot: Secondary | ICD-10-CM | POA: Insufficient documentation

## 2018-01-19 DIAGNOSIS — M25671 Stiffness of right ankle, not elsewhere classified: Secondary | ICD-10-CM

## 2018-01-19 NOTE — Patient Instructions (Signed)
Plantarflexion (Eccentric), (Resistance Band)    Point foot down against resistance band. Slowly release for 3-5 seconds. Use red resistance band. 15 reps per set, 1-2 sets per day, 4 days per week.  http://ecce.exer.us/3   Copyright  VHI. All rights reserved.    Dorsiflexion (Eccentric), (Resistance Band)    Pull foot up against resistance band. Slowly release for 3-5 seconds. Use red resistance band. 15 reps per set, 1-2 sets per day, 4 days per week.  http://ecce.exer.us/1   Copyright  VHI. All rights reserved.   Inversion (Eccentric), (Resistance Band)    Cross legs with right leg underneath, foot in tubing loop. Hold tubing around other foot to resist and turn foot in. Repeat 15 times per set. Do 1-2 sets per session. Do 4 sessions per day.  http://orth.exer.us/13   Copyright  VHI. All rights reserved.   Eversion (Eccentric), (Resistance Band)    Push foot outward against resistance band. Slowly release for 3-5 seconds. Use red resistance band. 15 reps per set, 1-2 sets per day, 4 days per week.  http://ecce.exer.us/7   Copyright  VHI. All rights reserved.
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
## 2018-01-25 ENCOUNTER — Encounter (HOSPITAL_COMMUNITY): Payer: BC Managed Care – PPO

## 2018-01-26 ENCOUNTER — Ambulatory Visit (HOSPITAL_COMMUNITY): Payer: BC Managed Care – PPO

## 2018-01-26 ENCOUNTER — Encounter (HOSPITAL_COMMUNITY): Payer: Self-pay

## 2018-01-26 DIAGNOSIS — M25571 Pain in right ankle and joints of right foot: Secondary | ICD-10-CM

## 2018-01-26 DIAGNOSIS — M25671 Stiffness of right ankle, not elsewhere classified: Secondary | ICD-10-CM
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
## 2018-02-01 ENCOUNTER — Ambulatory Visit (HOSPITAL_COMMUNITY): Payer: BC Managed Care – PPO

## 2018-02-01 ENCOUNTER — Encounter (HOSPITAL_COMMUNITY): Payer: Self-pay

## 2018-02-01 DIAGNOSIS — M25571 Pain in right ankle and joints of right foot: Secondary | ICD-10-CM

## 2018-02-01 DIAGNOSIS — M25671 Stiffness of right ankle, not elsewhere classified: Secondary | ICD-10-CM
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
## 2018-02-08 ENCOUNTER — Telehealth (HOSPITAL_COMMUNITY): Payer: Self-pay

## 2018-02-08 ENCOUNTER — Ambulatory Visit (HOSPITAL_COMMUNITY): Payer: BC Managed Care – PPO

## 2018-02-08 NOTE — Telephone Encounter (Signed)
She and her family are sick with the stomach bug

## 2018-02-15 ENCOUNTER — Ambulatory Visit (HOSPITAL_COMMUNITY): Payer: BC Managed Care – PPO | Attending: Student

## 2018-02-15 ENCOUNTER — Encounter (HOSPITAL_COMMUNITY): Payer: Self-pay

## 2018-02-15 DIAGNOSIS — M25671 Stiffness of right ankle, not elsewhere classified: Secondary | ICD-10-CM | POA: Insufficient documentation

## 2018-02-15 DIAGNOSIS — M25571 Pain in right ankle and joints of right foot: Secondary | ICD-10-CM | POA: Insufficient documentation
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
## 2018-02-21 ENCOUNTER — Telehealth (HOSPITAL_COMMUNITY): Payer: Self-pay

## 2018-02-21 NOTE — Telephone Encounter (Signed)
Patient called to cancel her appts she is doing fine and did'nt needto come back

## 2018-02-22 ENCOUNTER — Encounter (HOSPITAL_COMMUNITY): Payer: BC Managed Care – PPO

## 2018-03-01 ENCOUNTER — Ambulatory Visit (HOSPITAL_COMMUNITY): Payer: BC Managed Care – PPO

## 2018-03-08 ENCOUNTER — Encounter (HOSPITAL_COMMUNITY): Payer: BC Managed Care – PPO

## 2018-03-09 ENCOUNTER — Other Ambulatory Visit: Payer: Self-pay | Admitting: Family Medicine

## 2018-03-09 MED ORDER — CYCLOBENZAPRINE HCL 10 MG PO TABS: 10.0000 mg | ORAL_TABLET | Freq: Three times a day (TID) | ORAL | 2 refills | Status: DC | PRN

## 2018-03-09 NOTE — Telephone Encounter (Signed)
Ok to refill 

## 2018-03-24 ENCOUNTER — Encounter (HOSPITAL_COMMUNITY): Payer: Self-pay

## 2018-03-24 NOTE — Therapy (Signed)
[place], [place], [address] Phone: [phone]   Fax:  [phone]  Patient Details  Name: Yvette Conley MRN: [id number] Date of Birth: 03/22/1986 Referring Provider:  No ref. provider found  Encounter Date: 03/24/2018   PHYSICAL THERAPY DISCHARGE SUMMARY  Visits from Start of Care: 6  Current functional level related to goals / functional outcomes: Patient called on 02/21/18 asking to cancel all appointments and that she was feeling much better and does not feel she needs any more therapy. Below are her goal statuses from her last appointment on 02/15/18. She will be discharged from this episode of physical therapy.   Remaining deficits: PT Short Term Goals - 02/01/18 1523            PT SHORT TERM GOAL #1   Title  After 3 weeks pt will demonstrate Rt ankle DF A/ROM >6 degrees and P/ROM > 10 degrees.     Baseline  8/20: 3deg AROM, 10deg PROM    Time  3    Period  Weeks    Status  Partially Met        PT SHORT TERM GOAL #2   Title  After 3 weeks pt will demonstrate Rt ankle single leg calf raises >15x with full range.     Baseline  8/20: 6x    Time  3    Period  Weeks    Status  On-going           PT Long Term Goals - 02/01/18 1524            PT LONG TERM GOAL #1   Title  After 6 weeks pt will demonstrate Rt ankle DF A/ROM >9 degrees and P/ROM > 15 degrees.     Baseline  8/20: 3deg AROM, 10deg PROM    Time  6    Period  Weeks    Status  On-going        PT LONG TERM GOAL #2   Title  After 6 weeks pt will demonstrate Rt ankle single leg calf raises >25x with full range.     Baseline  8/20: 6x    Time  6    Status  On-going        PT LONG TERM GOAL #3   Title  After 6 weeks pt will demonstrate 3x 60seconds juming rope without any aggravation of pain in preparation for return to basketball.     Baseline  8/20: 3x60 seconds, no issues    Time  6    Period  Weeks     Status  Achieved       Education / Equipment: Educated on HEP throughout and on benefits of therapy.  Plan: Patient agrees to discharge.  Patient goals were partially met. Patient is being discharged due to the patient's request.  ?????      Kipp Brood, PT, DPT Physical Therapist with [facility]  03/24/2018 1:26 PM    [place], [place], [address] Phone: [phone]   Fax:  [phone]

## 2018-06-17 ENCOUNTER — Ambulatory Visit: Payer: BC Managed Care – PPO | Admitting: Family Medicine

## 2018-06-17 ENCOUNTER — Encounter: Payer: Self-pay | Admitting: Family Medicine

## 2018-06-17 VITALS — BP 110/70 | HR 99 | Temp 98.7°F | Resp 14 | Ht 67.0 in | Wt 251.4 lb

## 2018-06-17 DIAGNOSIS — J209 Acute bronchitis, unspecified: Secondary | ICD-10-CM | POA: Diagnosis not present

## 2018-06-17 MED ORDER — PREDNISONE 20 MG PO TABS: 40.0000 mg | ORAL_TABLET | Freq: Every day | ORAL | 0 refills | Status: AC

## 2018-06-17 MED ORDER — ALBUTEROL SULFATE (2.5 MG/3ML) 0.083% IN NEBU: 2.5000 mg | INHALATION_SOLUTION | Freq: Four times a day (QID) | RESPIRATORY_TRACT | 1 refills | Status: DC | PRN

## 2018-06-17 MED ORDER — GUAIFENESIN ER 600 MG PO TB12: 600.0000 mg | ORAL_TABLET | Freq: Two times a day (BID) | ORAL | 0 refills | Status: AC

## 2018-06-17 MED ORDER — ALBUTEROL SULFATE (2.5 MG/3ML) 0.083% IN NEBU
2.5000 mg | INHALATION_SOLUTION | Freq: Once | RESPIRATORY_TRACT | Status: AC
  Administered 2018-06-17: 2.5 mg via RESPIRATORY_TRACT

## 2018-06-17 MED ORDER — BENZONATATE 100 MG PO CAPS: 100.0000 mg | ORAL_CAPSULE | Freq: Three times a day (TID) | ORAL | 0 refills | Status: DC | PRN

## 2018-06-17 MED ORDER — AZITHROMYCIN 250 MG PO TABS
ORAL_TABLET | ORAL | 0 refills | Status: DC
Start: 2018-06-17 — End: 2019-07-03

## 2018-06-17 NOTE — Progress Notes (Signed)
Patient ID: Yvette Conley, female    DOB: [birth year], 33 y.o.   MRN: [id number]  PCP: Salley Scarlet, MD  Chief Complaint  Patient presents with  . Cough    Subjective:   Yvette Conley is a 33 y.o. female, presents to clinic with CC of uri and cough. Pt got ill with URI sx, nasal drainage and congestion, eyes were burning and itching, she had a scratchy throat and subjective fever.  Sex started 2 weeks ago, most sx, including fever gradually improved, but some nasal drainage, scratchy throat and then cough persisted.  Cough has worsened with SOB, chest tightness and burning sx in upper central chest radiating to throat.    Cough  This is a new problem. The current episode started 1 to 4 weeks ago (2 weeks). The problem has been gradually worsening. The problem occurs every few hours. The cough is productive of brown sputum. Associated symptoms include chills, a fever (2 weeks ago, subjective, resolved ), nasal congestion, postnasal drip, rhinorrhea, a sore throat, shortness of breath and wheezing. Pertinent negatives include no chest pain, ear congestion, ear pain, headaches, heartburn, hemoptysis, myalgias, rash, sweats or weight loss. The symptoms are aggravated by lying down and exercise. Risk factors: none identified, no hx of smoking, no recent travel, no occupational exposure. She has tried rest and OTC cough suppressant for the symptoms. The treatment provided no relief. There is no history of asthma, bronchitis, COPD or pneumonia.      Patient Active Problem List   Diagnosis Date Noted  . IBS (irritable bowel syndrome) 02/23/2017  . Back spasm 04/09/2014  . General medical examination 10/06/2011  . 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 Obesity 02/08/2011  . HEMORRHOIDS-INTERNAL 08/15/2010     Prior to Admission medications   Medication Sig Start Date End Date Taking? Authorizing Provider  cyclobenzaprine (FLEXERIL) 10 MG tablet Take 1 tablet (10 mg total) by mouth 3  (three) times daily as needed for muscle spasms. 03/09/18   Dorena Bodo, PA-C  diphenhydrAMINE (BENADRYL) 12.5 MG/5ML liquid Take by mouth 4 (four) times daily as needed.    [provider]  HYDROcodone-acetaminophen (NORCO/VICODIN) 5-325 MG tablet Take 1 tablet by mouth every 6 (six) hours as needed for moderate pain.    [provider]  ibuprofen (ADVIL,MOTRIN) 800 MG tablet Take 1 tablet (800 mg total) by mouth every 8 (eight) hours as needed. 11/10/17   Danelle Berry, PA-C  Naphazoline-Glycerin (CLEAR EYES COOLING COMFORT OP) Apply 1 drop to eye daily as needed (dryness).    [provider]  predniSONE (DELTASONE) 20 MG tablet  12/03/17   [provider]  Propylene Glycol-Glycerin (SOOTHE) 0.6-0.6 % SOLN Apply 2 drops to eye 2 (two) times daily. 12/08/17   Danelle Berry, PA-C  valACYclovir (VALTREX) 1000 MG tablet  12/03/17   [provider]     Allergies  Allergen Reactions  . Latex Other (See Comments)    Reaction: burning   . Sulfa Antibiotics Hives    Torso, chest areas     Family History  Problem Relation Age of Onset  . Crohn's disease Maternal Aunt   . Diabetes Maternal Grandmother   . Heart disease Maternal Grandmother   . Kidney disease Maternal Grandmother   . Diverticulosis Maternal Grandmother   . Hypertension Maternal Grandmother   . Thyroid disease Maternal Grandmother   . Diabetes Mother   . Diabetes Father   . Diabetes Maternal Grandfather   . Hypertension  Maternal Grandfather   . Heart disease Maternal Grandfather   . Hypertension Paternal Grandmother   . Diabetes Paternal Grandmother   . Hypertension Paternal Grandfather   . Diabetes Paternal Grandfather   . Diabetes Maternal Uncle      Social History   Socioeconomic History  . Marital status: Married    Spouse name: Not on file  . Number of children: Not on file  . Years of education: Not on file  . Highest education level: Not on file  Occupational  History  . Occupation: Consulting civil engineerstudent  Social Needs  . Financial resource strain: Not on file  . Food insecurity:    Worry: Not on file    Inability: Not on file  . Transportation needs:    Medical: Not on file    Non-medical: Not on file  Tobacco Use  . Smoking status: Never Smoker  . Smokeless tobacco: Never Used  Substance and Sexual Activity  . Alcohol use: No  . Drug use: No  . Sexual activity: Yes    Birth control/protection: None  Lifestyle  . Physical activity:    Days per week: Not on file    Minutes per session: Not on file  . Stress: Not on file  Relationships  . Social connections:    Talks on phone: Not on file    Gets together: Not on file    Attends religious service: Not on file    Active member of club or organization: Not on file    Attends meetings of clubs or organizations: Not on file    Relationship status: Not on file  . Intimate partner violence:    Fear of current or ex partner: Not on file    Emotionally abused: Not on file    Physically abused: Not on file    Forced sexual activity: Not on file  Other Topics Concern  . Not on file  Social History Narrative   Daily caffeine use: 2 daily   No illicit Drug use     Review of Systems  Constitutional: Positive for chills and fever (2 weeks ago, subjective, resolved ). Negative for weight loss.  HENT: Positive for postnasal drip, rhinorrhea and sore throat. Negative for ear pain.   Eyes: Negative.   Respiratory: Positive for cough, shortness of breath and wheezing. Negative for hemoptysis.   Cardiovascular: Negative.  Negative for chest pain.  Gastrointestinal: Negative.  Negative for heartburn.  Endocrine: Negative.   Genitourinary: Negative.   Musculoskeletal: Negative.  Negative for myalgias.  Skin: Negative.  Negative for rash.  Allergic/Immunologic: Negative.   Neurological: Negative.  Negative for headaches.  Hematological: Negative.   Psychiatric/Behavioral: Negative.   All other systems  reviewed and are negative.      Objective:    Vitals:   06/17/18 1251  BP: 110/70  Pulse: 99  Resp: 14  Temp: 98.7 F (37.1 C)  SpO2: 100%  Weight: 251 lb 6.4 oz (114 kg)  Height: 5\' 7"  (1.702 m)      Physical Exam Vitals signs and nursing note reviewed.  Constitutional:      General: She is not in acute distress.    Appearance: She is well-developed. She is not ill-appearing, toxic-appearing or diaphoretic.  HENT:     Head: Normocephalic and atraumatic.     Right Ear: Hearing, tympanic membrane, ear canal and external ear normal.     Left Ear: Hearing, tympanic membrane, ear canal and external ear normal.     Nose: Mucosal  edema, congestion and rhinorrhea present.     Right Sinus: No maxillary sinus tenderness or frontal sinus tenderness.     Left Sinus: No maxillary sinus tenderness or frontal sinus tenderness.     Mouth/Throat:     Mouth: Mucous membranes are moist. Mucous membranes are not pale.     Pharynx: Uvula midline. Posterior oropharyngeal erythema present. No oropharyngeal exudate or uvula swelling.     Tonsils: No tonsillar abscesses.  Eyes:     General:        Right eye: No discharge.        Left eye: No discharge.     Conjunctiva/sclera: Conjunctivae normal.     Pupils: Pupils are equal, round, and reactive to light.  Neck:     Musculoskeletal: Normal range of motion and neck supple.     Trachea: No tracheal deviation.  Cardiovascular:     Rate and Rhythm: Normal rate and regular rhythm.     Pulses: Normal pulses.     Heart sounds: Normal heart sounds.  Pulmonary:     Effort: Pulmonary effort is normal. No respiratory distress.     Breath sounds: No stridor. Wheezing present. No rhonchi or rales.  Abdominal:     General: Bowel sounds are normal. There is no distension.     Palpations: Abdomen is soft.  Musculoskeletal: Normal range of motion.  Lymphadenopathy:     Cervical: No cervical adenopathy.  Skin:    General: Skin is warm and dry.      Coloration: Skin is not pale.     Findings: No rash.  Neurological:     Mental Status: She is alert.     Motor: No abnormal muscle tone.     Coordination: Coordination normal.  Psychiatric:        Behavior: Behavior normal.           Assessment & Plan:   URI sx with fever 2 weeks ago that developed cough, wheeze, sob.  No recent fever.  Cough is productive, on exam she has wheeze and some diminished BS b/l at the bases, bronchospasms/cough triggered with trying to take deep breaths.  She continues to have some mild nasal and throat irritation sx, but physical exam is not consistent with acute bacterial sinusitis.  She was given a DuoNeb and her breath sounds did improve.  Will treat for acute bronchitis.  If she does not improve with steroids inhalers and cough suppressants she can add Z-Pak for additional pulmonary antiinflammatory effects if not improving. It pt develops any sinus pain (acute worsening/double worsening) with fever, pt to call and follow up in clinic.   She has no hx of lung disease, no smoking hx.  Pt appears stable, safe for outpt tx.  F/up as needed.    ICD-10-CM   1. Acute bronchitis, unspecified organism J20.9 albuterol (PROVENTIL) (2.5 MG/3ML) 0.083% nebulizer solution 2.5 mg    predniSONE (DELTASONE) 20 MG tablet    benzonatate (TESSALON) 100 MG capsule    albuterol (PROVENTIL) (2.5 MG/3ML) 0.083% nebulizer solution    guaiFENesin (MUCINEX) 600 MG 12 hr tablet       Danelle BerryLeisa Apolinar Bero, PA-C 06/17/18 12:53 PM

## 2018-06-22 ENCOUNTER — Encounter: Payer: Self-pay | Admitting: Family Medicine

## 2018-08-30 ENCOUNTER — Other Ambulatory Visit: Payer: Self-pay | Admitting: Family Medicine

## 2019-05-17 ENCOUNTER — Telehealth: Payer: BC Managed Care – PPO | Admitting: Family

## 2019-05-17 ENCOUNTER — Encounter (INDEPENDENT_AMBULATORY_CARE_PROVIDER_SITE_OTHER): Payer: Self-pay

## 2019-05-17 ENCOUNTER — Other Ambulatory Visit: Payer: Self-pay

## 2019-05-17 ENCOUNTER — Telehealth: Payer: Self-pay

## 2019-05-17 DIAGNOSIS — Z20822 Contact with and (suspected) exposure to covid-19: Secondary | ICD-10-CM

## 2019-05-17 DIAGNOSIS — Z20828 Contact with and (suspected) exposure to other viral communicable diseases: Secondary | ICD-10-CM

## 2019-05-17 MED ORDER — BENZONATATE 100 MG PO CAPS: 100.0000 mg | ORAL_CAPSULE | Freq: Three times a day (TID) | ORAL | 0 refills | Status: DC | PRN

## 2019-05-17 NOTE — Progress Notes (Signed)
E-Visit for Corona Virus Screening   Your current symptoms could be consistent with the coronavirus.  Many health care providers can now test patients at their office but not all are.  [facility] has multiple testing sites. For information on our COVID testing locations and hours go to [email or web address]  Please quarantine yourself while awaiting your test results.  We are enrolling you in our MyChart Home Montioring for COVID19 . Daily you will receive a questionnaire within the MyChart website. Our COVID 19 response team willl be monitoriing your responses daily. Please continue good preventive care measures, including:  frequent hand-washing, avoid touching your face, cover coughs/sneezes, stay out of crowds and keep a 6 foot distance from others.   You can go to one of the testing sites listed below, while they are opened (see hours). You do not need an order and will stay in your car during the test. You do need to self isolate until your results return and if positive 14 days from when your symptoms started and until you are 3 days fever free.   Testing Locations (Monday - Friday, 10 a.m. - 3 p.m.) . [place]: [facility], [address], [place], [place]  . [place]: [facility], [address], [place], [place] (entrance off [address])  . [place]: (Closed each Monday): Testing site relocated to the short stay covered drive at [facility]. (Use the [address] entrance to [facility] next to [facility].)    COVID-19 is a respiratory illness with symptoms that are similar to the flu. Symptoms are typically mild to moderate, but there have been cases of severe illness and death due to the virus. The following symptoms may appear 2-14 days after exposure: . Fever . Cough . Shortness of breath or difficulty breathing . Chills . Repeated shaking with chills . Muscle  pain . Headache . Sore throat . New loss of taste or smell . Fatigue . Congestion or runny nose . Nausea or vomiting . Diarrhea  If you develop fever/cough/breathlessness, please stay home for 10 days with improving symptoms and until you have had 24 hours of no fever (without taking a fever reducer).  Go to the nearest hospital ED for assessment if fever/cough/breathlessness are severe or illness seems like a threat to life.  It is vitally important that if you feel that you have an infection such as this virus or any other virus that you stay home and away from places where you may spread it to others.  You should avoid contact with people age 65 and older.   You should wear a mask or cloth face covering over your nose and mouth if you must be around other people or animals, including pets (even at home). Try to stay at least 6 feet away from other people. This will protect the people around you.  You can use medication such as A prescription cough medication called Tessalon Perles 100 mg. You may take 1-2 capsules every 8 hours as needed for cough.  You may also take acetaminophen (Tylenol) as needed for fever.   Reduce your risk of any infection by using the same precautions used for avoiding the common cold or flu:  . Wash your hands often with soap and warm water for at least 20 seconds.  If soap and water are not readily available, use an alcohol-based hand sanitizer with at least 60% alcohol.  . If coughing or   sneezing, cover your mouth and nose by coughing or sneezing into the elbow areas of your shirt or coat, into a tissue or into your sleeve (not your hands). . Avoid shaking hands with others and consider head nods or verbal greetings only. . Avoid touching your eyes, nose, or mouth with unwashed hands.  . Avoid close contact with people who are sick. . Avoid places or events with large numbers of people in one location, like concerts or sporting events. . Carefully consider  travel plans you have or are making. . If you are planning any travel outside or inside the [place], visit the CDC's Travelers' Health webpage for the latest health notices. . If you have some symptoms but not all symptoms, continue to monitor at home and seek medical attention if your symptoms worsen. . If you are having a medical emergency, call 911.  HOME CARE . Only take medications as instructed by your medical team. . Drink plenty of fluids and get plenty of rest. . A steam or ultrasonic humidifier can help if you have congestion.   GET HELP RIGHT AWAY IF YOU HAVE EMERGENCY WARNING SIGNS** FOR COVID-19. If you or someone is showing any of these signs seek emergency medical care immediately. Call 911 or proceed to your closest emergency facility if: . You develop worsening high fever. . Trouble breathing . Bluish lips or face . Persistent pain or pressure in the chest . New confusion . Inability to wake or stay awake . You cough up blood. . Your symptoms become more severe  **This list is not all possible symptoms. Contact your medical provider for any symptoms that are sever or concerning to you.   MAKE SURE YOU   Understand these instructions.  Will watch your condition.  Will get help right away if you are not doing well or get worse.  Your e-visit answers were reviewed by a board certified advanced clinical practitioner to complete your personal care plan.  Depending on the condition, your plan could have included both over the counter or prescription medications.  If there is a problem please reply once you have received a response from your provider.  Your safety is important to us.  If you have drug allergies check your prescription carefully.    You can use MyChart to ask questions about today's visit, request a non-urgent call back, or ask for a work or school excuse for 24 hours related to this e-Visit. If it has been greater than 24 hours you will need to follow up with  your provider, or enter a new e-Visit to address those concerns. You will get an e-mail in the next two days asking about your experience.  I hope that your e-visit has been valuable and will speed your recovery. Thank you for using e-visits.  Approximately 5 minutes was spent documenting and reviewing patient's chart.    

## 2019-05-17 NOTE — Telephone Encounter (Signed)
Patient advised on cough and weakness per protocol:   If cough remains the same or better: continue to treat with over the counter medications. Hard candy or cough drops and drinking warm fluids. Adults can also use honey 2 tsp (10 ML) at bedtime.   HONEY IS NOT RECOMMENDED FOR INFANTS UNDER ONE.   If cough is becoming worse even with the use of over the counter medications and patient is not able to sleep at night, cough becomes productive with sputum that maybe yellow or green in color, contact PCP.   IF PATIENT HAS WORSENING WEAKNESS WITH INABILITY TO STAND OR IF PATIENT HAS TO HOLD ON TO SOMETHING TO GET BALANCE, ADVISE PATIENT TO CALL 911 AND SEEK TREATMENT IN ED.  Patient states that she has body aches, migraine, and weakness. Patient will continue to monitor symptoms. Patient verbalized understanding

## 2019-05-19 ENCOUNTER — Encounter (INDEPENDENT_AMBULATORY_CARE_PROVIDER_SITE_OTHER): Payer: Self-pay

## 2019-05-19 LAB — NOVEL CORONAVIRUS, NAA: SARS-CoV-2, NAA: NOT DETECTED

## 2019-05-20 ENCOUNTER — Encounter (INDEPENDENT_AMBULATORY_CARE_PROVIDER_SITE_OTHER): Payer: Self-pay

## 2019-05-21 ENCOUNTER — Encounter (INDEPENDENT_AMBULATORY_CARE_PROVIDER_SITE_OTHER): Payer: Self-pay

## 2019-06-16 HISTORY — PX: BREAST REDUCTION SURGERY: SHX8

## 2019-06-16 HISTORY — PX: OTHER SURGICAL HISTORY: SHX169

## 2019-07-03 ENCOUNTER — Ambulatory Visit (INDEPENDENT_AMBULATORY_CARE_PROVIDER_SITE_OTHER): Payer: BC Managed Care – PPO | Admitting: Family Medicine

## 2019-07-03 ENCOUNTER — Other Ambulatory Visit: Payer: Self-pay

## 2019-07-03 ENCOUNTER — Encounter: Payer: Self-pay | Admitting: Family Medicine

## 2019-07-03 VITALS — BP 130/64 | HR 70 | Temp 98.7°F | Resp 14 | Ht 67.0 in | Wt 246.0 lb

## 2019-07-03 DIAGNOSIS — E559 Vitamin D deficiency, unspecified: Secondary | ICD-10-CM

## 2019-07-03 DIAGNOSIS — M791 Myalgia, unspecified site: Secondary | ICD-10-CM

## 2019-07-03 DIAGNOSIS — G8929 Other chronic pain: Secondary | ICD-10-CM

## 2019-07-03 DIAGNOSIS — M545 Low back pain, unspecified: Secondary | ICD-10-CM

## 2019-07-03 DIAGNOSIS — N62 Hypertrophy of breast: Secondary | ICD-10-CM

## 2019-07-03 DIAGNOSIS — M546 Pain in thoracic spine: Secondary | ICD-10-CM

## 2019-07-03 MED ORDER — DICLOFENAC SODIUM 75 MG PO TBEC: 75.0000 mg | DELAYED_RELEASE_TABLET | Freq: Two times a day (BID) | ORAL | 0 refills | Status: DC

## 2019-07-03 MED ORDER — CYCLOBENZAPRINE HCL 10 MG PO TABS: 10.0000 mg | ORAL_TABLET | Freq: Three times a day (TID) | ORAL | 0 refills | Status: DC | PRN

## 2019-07-03 MED ORDER — HYDROCODONE-ACETAMINOPHEN 5-325 MG PO TABS: 1.0000 | ORAL_TABLET | Freq: Four times a day (QID) | ORAL | 0 refills | Status: DC | PRN

## 2019-07-03 NOTE — Patient Instructions (Addendum)
Referral to plastic surgery Take anti-inflammatory /muscle relaxer We will call with lab results  F/U as needed

## 2019-07-03 NOTE — Progress Notes (Signed)
Subjective:    Patient ID: Yvette Conley, female    DOB: Feb 05, 1986, 34 y.o.   MRN: [id number]  Patient presents for Back Spasms (intermittent pain radiating up and down back- would like to discuss breast reduction)   Pt here with back pain. She gets low back pain typically, but past few months or more she has progressive spasas and muscle aches in her upper back/neck and the lower back.  She does have a larger bust size and think this contributes to her neck shoulder and back pain.  She gets indentations into her shoulder from her bra.  She is interested in consultation with a plastic surgeon about breast reduction. She has also however had progressive episodes where myalgias were hit into her thighs and her legs.  The pain just tends to radiate all over the muscles.  She does not have any significant joint pain but occasionally her fingers will feel little swollen.  The last hematocrit was back in November.  Walking sitting standing for long peers of time all cause more pain in her muscles.  She often can even lift typical things around the home because of the myalgias.  She has had this before last back in 2011 she had extensive neurological work-up she was diagnosed with paroxysmal dystonia she was on baclofen she is also on Lyrica diclofenac at that time. He has not been sick per se no URI symptoms but then states that last Thursday she had a severe headache she took her temperature was 101.4 but then went away quickly.  She had Covid test a couple weeks ago as she was not feeling well but that was negative.  She has not had any further fever but does feel like in general her temperature goes up when she does get a migraine headache.   Breast size  42 FF Review Of Systems:  GEN- denies fatigue, fever, weight loss,weakness, recent illness HEENT- denies eye drainage, change in vision, nasal discharge, CVS- denies chest pain, palpitations RESP- denies SOB, cough, wheeze ABD- denies N/V, change  in stools, abd pain GU- denies dysuria, hematuria, dribbling, incontinence MSK-+ joint pain,+ muscle aches, injury Neuro- denies headache, dizziness, syncope, seizure activity       Objective:    BP 130/64   Pulse 70   Temp 98.7 F (37.1 C) (Temporal)   Resp 14   Ht 5\' 7"  (1.702 m)   Wt 246 lb (111.6 kg)   SpO2 98%   BMI 38.53 kg/m  GEN- NAD, alert and oriented x3 HEENT- PERRL, EOMI, non injected sclera, pink conjunctiva  Neck- Supple, good ROM  CVS- RRR, no murmur RESP-CTAB MSK- TTP along spine, fair ROM, indentation in shoulder from bra, +spasm upper and lower back,  good ROM HIPS/KNEES, no effusion, mild TTP bilat hips  NEURO- sensation grossly in tact lower ext, strength equal bilat  EXT- No edema Pulses- Radial, DP- 2+  Xray Lumbar spine  2017 Normal       Assessment & Plan:      Problem List Items Addressed This Visit    None    Visit Diagnoses    Myalgia    -  Primary   Chronic back pain with progressive myalgias, has had in the past, nothing specific found, ? fibromyalgia. will check inflammatory markers and CK today   Relevant Orders   CBC with Differential/Platelet   Comprehensive metabolic panel   Vitamin D, 25-hydroxy   Vitamin B12   Sedimentation Rate   CK  C-reactive protein   Chronic bilateral thoracic back pain       Referral for consult about breast reduction, due to progressive upper back pain/shoulder/neck pain, I think she would benefit from reduction   Relevant Medications   acetaminophen (APAP EXTRA STRENGTH) 500 MG tablet   cyclobenzaprine (FLEXERIL) 10 MG tablet   diclofenac (VOLTAREN) 75 MG EC tablet   HYDROcodone-acetaminophen (NORCO) 5-325 MG tablet   Other Relevant Orders   Ambulatory referral to Plastic Surgery   Chronic bilateral low back pain without sciatica       For back pain and myalgias given diclofenac, flexeril norco. she had vision changes with prednisone, no red flags one exam   Relevant Medications    acetaminophen (APAP EXTRA STRENGTH) 500 MG tablet   cyclobenzaprine (FLEXERIL) 10 MG tablet   diclofenac (VOLTAREN) 75 MG EC tablet   HYDROcodone-acetaminophen (NORCO) 5-325 MG tablet   Other Relevant Orders   Ambulatory referral to Plastic Surgery   Vitamin D deficiency       Relevant Orders   Vitamin D, 25-hydroxy   Large breasts       Relevant Orders   Ambulatory referral to Plastic Surgery      Note: This dictation was prepared with Dragon dictation along with smaller phrase technology. Any transcriptional errors that result from this process are unintentional.

## 2019-07-04 LAB — VITAMIN B12: Vitamin B-12: 682 pg/mL (ref 200–1100)

## 2019-07-04 LAB — CBC WITH DIFFERENTIAL/PLATELET
Absolute Monocytes: 531 cells/uL (ref 200–950)
Basophils Absolute: 39 cells/uL (ref 0–200)
Basophils Relative: 0.5 %
Eosinophils Absolute: 54 cells/uL (ref 15–500)
Eosinophils Relative: 0.7 %
HCT: 35.6 % (ref 35.0–45.0)
Hemoglobin: 12 g/dL (ref 11.7–15.5)
Lymphs Abs: 2272 cells/uL (ref 850–3900)
MCH: 31 pg (ref 27.0–33.0)
MCHC: 33.7 g/dL (ref 32.0–36.0)
MCV: 92 fL (ref 80.0–100.0)
MPV: 10.4 fL (ref 7.5–12.5)
Monocytes Relative: 6.9 %
Neutro Abs: 4805 cells/uL (ref 1500–7800)
Neutrophils Relative %: 62.4 %
Platelets: 378 10*3/uL (ref 140–400)
RBC: 3.87 10*6/uL (ref 3.80–5.10)
RDW: 11.9 % (ref 11.0–15.0)
Total Lymphocyte: 29.5 %
WBC: 7.7 10*3/uL (ref 3.8–10.8)

## 2019-07-04 LAB — COMPREHENSIVE METABOLIC PANEL
AG Ratio: 1.4 (calc) (ref 1.0–2.5)
ALT: 10 U/L (ref 6–29)
AST: 12 U/L (ref 10–30)
Albumin: 4.2 g/dL (ref 3.6–5.1)
Alkaline phosphatase (APISO): 46 U/L (ref 31–125)
BUN: 7 mg/dL (ref 7–25)
CO2: 27 mmol/L (ref 20–32)
Calcium: 9.7 mg/dL (ref 8.6–10.2)
Chloride: 105 mmol/L (ref 98–110)
Creat: 0.62 mg/dL (ref 0.50–1.10)
Globulin: 3 g/dL (calc) (ref 1.9–3.7)
Glucose, Bld: 92 mg/dL (ref 65–99)
Potassium: 4.4 mmol/L (ref 3.5–5.3)
Sodium: 141 mmol/L (ref 135–146)
Total Bilirubin: 0.3 mg/dL (ref 0.2–1.2)
Total Protein: 7.2 g/dL (ref 6.1–8.1)

## 2019-07-04 LAB — SEDIMENTATION RATE: Sed Rate: 34 mm/h — ABNORMAL HIGH (ref 0–20)

## 2019-07-04 LAB — C-REACTIVE PROTEIN: CRP: 7.4 mg/L (ref <8.0)

## 2019-07-04 LAB — CK: Total CK: 64 U/L (ref 29–143)

## 2019-07-04 LAB — VITAMIN D 25 HYDROXY (VIT D DEFICIENCY, FRACTURES): Vit D, 25-Hydroxy: 12 ng/mL — ABNORMAL LOW (ref 30–100)

## 2019-07-06 ENCOUNTER — Encounter: Payer: Self-pay | Admitting: Family Medicine

## 2019-07-06 MED ORDER — VITAMIN D (ERGOCALCIFEROL) 1.25 MG (50000 UNIT) PO CAPS: 50000.0000 [IU] | ORAL_CAPSULE | ORAL | 0 refills | Status: DC

## 2019-07-12 ENCOUNTER — Telehealth: Payer: Self-pay | Admitting: Plastic Surgery

## 2019-07-12 NOTE — Telephone Encounter (Signed)

## 2019-07-13 ENCOUNTER — Encounter: Payer: Self-pay | Admitting: Plastic Surgery

## 2019-07-13 ENCOUNTER — Ambulatory Visit: Payer: BC Managed Care – PPO | Admitting: Plastic Surgery

## 2019-07-13 ENCOUNTER — Other Ambulatory Visit: Payer: Self-pay

## 2019-07-13 VITALS — BP 103/75 | HR 104 | Temp 98.0°F | Ht 67.0 in | Wt 250.5 lb

## 2019-07-13 DIAGNOSIS — N62 Hypertrophy of breast: Secondary | ICD-10-CM
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
## 2019-07-14 ENCOUNTER — Other Ambulatory Visit: Payer: Self-pay | Admitting: Family Medicine

## 2019-07-14 NOTE — Telephone Encounter (Signed)
Requesting refill    Flexeril  LOV: 07/03/19  LRF:  07/03/19

## 2019-07-21 ENCOUNTER — Telehealth: Payer: Self-pay | Admitting: Plastic Surgery

## 2019-07-21 NOTE — Telephone Encounter (Signed)
Called patient to advise of insurance response. Patient will need a referral to Physical Therapy and a follow up appointment with Dr. Arita Miss after 6 weeks of PT is completed.

## 2019-07-28 ENCOUNTER — Telehealth: Payer: Self-pay | Admitting: Plastic Surgery

## 2019-08-01 ENCOUNTER — Other Ambulatory Visit: Payer: Self-pay | Admitting: Family Medicine

## 2019-08-02 NOTE — Telephone Encounter (Signed)
Ok to refill Flexeril? 

## 2019-08-10 ENCOUNTER — Other Ambulatory Visit: Payer: Self-pay

## 2019-08-10 ENCOUNTER — Ambulatory Visit: Payer: BC Managed Care – PPO | Attending: Plastic Surgery | Admitting: Physical Therapy

## 2019-08-10 ENCOUNTER — Encounter: Payer: Self-pay | Admitting: Physical Therapy

## 2019-08-10 DIAGNOSIS — M62838 Other muscle spasm: Secondary | ICD-10-CM | POA: Diagnosis present

## 2019-08-10 DIAGNOSIS — M546 Pain in thoracic spine: Secondary | ICD-10-CM | POA: Diagnosis present

## 2019-08-10 DIAGNOSIS — R293 Abnormal posture: Secondary | ICD-10-CM | POA: Diagnosis present
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
## 2019-08-11 ENCOUNTER — Ambulatory Visit (HOSPITAL_COMMUNITY)
Admission: RE | Admit: 2019-08-11 | Discharge: 2019-08-11 | Disposition: A | Discharge status: Home / Self Care | Payer: BC Managed Care – PPO | Source: Ambulatory Visit | Attending: Family Medicine | Admitting: Family Medicine

## 2019-08-11 ENCOUNTER — Encounter (HOSPITAL_COMMUNITY): Payer: Self-pay

## 2019-08-11 ENCOUNTER — Ambulatory Visit: Payer: BC Managed Care – PPO | Admitting: Family Medicine

## 2019-08-11 ENCOUNTER — Encounter: Payer: Self-pay | Admitting: Family Medicine

## 2019-08-11 VITALS — BP 136/74 | HR 100 | Temp 98.1°F | Resp 14 | Ht 67.0 in | Wt 251.0 lb

## 2019-08-11 DIAGNOSIS — M545 Low back pain, unspecified: Secondary | ICD-10-CM

## 2019-08-11 DIAGNOSIS — M546 Pain in thoracic spine: Secondary | ICD-10-CM

## 2019-08-11 DIAGNOSIS — M7989 Other specified soft tissue disorders: Secondary | ICD-10-CM

## 2019-08-11 DIAGNOSIS — M2550 Pain in unspecified joint: Secondary | ICD-10-CM

## 2019-08-11 DIAGNOSIS — G8929 Other chronic pain: Secondary | ICD-10-CM | POA: Insufficient documentation

## 2019-08-11 MED ORDER — NABUMETONE 500 MG PO TABS: 500.0000 mg | ORAL_TABLET | Freq: Two times a day (BID) | ORAL | 1 refills | Status: DC | PRN

## 2019-08-11 MED ORDER — HYDROCODONE-ACETAMINOPHEN 5-325 MG PO TABS: 1.0000 | ORAL_TABLET | Freq: Four times a day (QID) | ORAL | 0 refills | Status: DC | PRN

## 2019-08-11 NOTE — Patient Instructions (Addendum)
Get the ultrasound done at [facility] If you have a blood clot, take the xarelto 15mg  with food twice a day  Try the Relafen anti-inflammatory F/U pending results

## 2019-08-11 NOTE — Progress Notes (Signed)
Subjective:    Patient ID: Yvette Conley, female    DOB: 07-Dec-1985, 34 y.o.   MRN: [id number]  Patient presents for Back Pain (feels like medications are not helping) and BLE Edema (has knot and brusing to R outer calf)  Currently in PT by her plastic surgeon due to worsening thoracic back pain, needed for insurance before breast reduction can be done. Back pain in general has worsened, has pain all over, has difficulty walking at times.  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 She feels like her feet have been cold with the swelling and noticed a color change to her  Nails had a bluish tent at one point. The swelling has now went down but she has a knot on the right lower leg over a vein   She would like to be checked for lupus, had neg ANA a few years back   She had side effects of Lyrica , does not tolerate prednisone Currently on diclofenac but this is not helping,also has muscle relaxer  Review Of Systems:  GEN- denies fatigue, fever, weight loss,weakness, recent illness HEENT- denies eye drainage, change in vision, nasal discharge, CVS- denies chest pain, palpitations RESP- denies SOB, cough, wheeze ABD- denies N/V, change in stools, abd pain GU- denies dysuria, hematuria, dribbling, incontinence MSK- + joint pain, +muscle aches, injury Neuro- denies headache, dizziness, syncope, seizure activity       Objective:    BP 136/74   Pulse 100   Temp 98.1 F (36.7 C) (Temporal)   Resp 14   Ht 5\' 7"  (1.702 m)   Wt 251 lb (113.9 kg)   LMP 07/24/2019   SpO2 99%   BMI 39.31 kg/m  GEN- NAD, alert and oriented x3 CVS- RRR, no murmur RESP-CTAB  MSK TTP along spine, fair ROM  good ROM HIPS/KNEES, no effusion  NEURO- sensation grossly in tact lower ext, strength equal bilat  EXT- No edema , small subcutaneous mass palpated over vein right lateal lower leg, neg homans Pulses- Radial, DP-  2+        Assessment & Plan:      Problem List Items Addressed This Visit    None    Visit Diagnoses    Right leg swelling    -  Primary   Urgent 09/21/2019 obtained to f/u DVT, negative, treat subcutaneous knot with warm compresses,NSAID   Relevant Orders   US Venous Img Lower Unilateral Right (Completed)   Chronic bilateral low back pain without sciatica       Change to relafen, continue muscle relaxer as needed, pain med, obtain xrays of spine   Relevant Medications   nabumetone (RELAFEN) 500 MG tablet   HYDROcodone-acetaminophen (NORCO) 5-325 MG tablet   Other Relevant Orders   DG Lumbar Spine Complete (Completed)   Chronic bilateral thoracic back pain       Relevant Medications   nabumetone (RELAFEN) 500 MG tablet   HYDROcodone-acetaminophen (NORCO) 5-325 MG tablet   Other Relevant Orders   DG Thoracic Spine W/Swimmers (Completed)   Arthralgia, unspecified joint       check ANA, negative in past on work up, chronic myalgia, working diagnosis of fibromyalgia but her symptoms are out of proportion to this diagnosis so will plan for rheumatology evaluation   Relevant Orders   ANA (Completed)   Rheumatoid factor (Completed)      Note: This  dictation was prepared with Dragon dictation along with smaller phrase technology. Any transcriptional errors that result from this process are unintentional.

## 2019-08-12 LAB — RHEUMATOID FACTOR: Rhuematoid fact SerPl-aCnc: <14 IU/mL (ref <14)

## 2019-08-12 LAB — ANA: Anti Nuclear Antibody (ANA): NEGATIVE

## 2019-08-13 ENCOUNTER — Encounter: Payer: Self-pay | Admitting: Family Medicine

## 2019-08-14 ENCOUNTER — Other Ambulatory Visit: Payer: Self-pay | Admitting: *Deleted

## 2019-08-14 DIAGNOSIS — M2550 Pain in unspecified joint: Secondary | ICD-10-CM

## 2019-08-14 DIAGNOSIS — M791 Myalgia, unspecified site: Secondary | ICD-10-CM

## 2019-08-18 ENCOUNTER — Ambulatory Visit (HOSPITAL_COMMUNITY): Payer: BC Managed Care – PPO

## 2019-08-19 ENCOUNTER — Ambulatory Visit: Payer: BC Managed Care – PPO | Attending: Internal Medicine

## 2019-08-19 DIAGNOSIS — Z23 Encounter for immunization: Secondary | ICD-10-CM | POA: Insufficient documentation

## 2019-08-19 NOTE — Progress Notes (Signed)
   Covid-19 Vaccination Clinic  Name:  Yvette Conley    MRN: [id number] DOB: 17-Apr-1986  08/19/2019  [name] was observed post Covid-19 immunization for 30 minutes based on pre-vaccination screening without incident. She was provided with Vaccine Information Sheet and instruction to access the V-Safe system.   [name] was instructed to call 911 with any severe reactions post vaccine: Marland Kitchen Difficulty breathing  . Swelling of face and throat  . A fast heartbeat  . A bad rash all over body  . Dizziness and weakness   Immunizations Administered    Name Date Dose VIS Date Route   Pfizer COVID-19 Vaccine 08/19/2019  2:04 PM 0.3 mL 05/26/2019 Intramuscular   Manufacturer: [employer], [employer]   Lot: [id number]   NDC: [address]-6

## 2019-08-21 ENCOUNTER — Ambulatory Visit: Payer: BC Managed Care – PPO | Attending: Plastic Surgery

## 2019-08-21 ENCOUNTER — Other Ambulatory Visit: Payer: Self-pay

## 2019-08-21 DIAGNOSIS — M25571 Pain in right ankle and joints of right foot: Secondary | ICD-10-CM

## 2019-08-21 DIAGNOSIS — M546 Pain in thoracic spine: Secondary | ICD-10-CM | POA: Diagnosis not present

## 2019-08-21 DIAGNOSIS — M25671 Stiffness of right ankle, not elsewhere classified: Secondary | ICD-10-CM | POA: Diagnosis present

## 2019-08-21 DIAGNOSIS — M62838 Other muscle spasm: Secondary | ICD-10-CM | POA: Diagnosis present

## 2019-08-21 DIAGNOSIS — R293 Abnormal posture: Secondary | ICD-10-CM | POA: Diagnosis present
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
## 2019-08-22 NOTE — Patient Instructions (Signed)
cervical retraction and mid back hallelujah stretches with vertical towel counter pressure in sitting or standing.

## 2019-08-24 ENCOUNTER — Ambulatory Visit: Payer: BC Managed Care – PPO | Admitting: Physical Therapy

## 2019-08-28 ENCOUNTER — Ambulatory Visit: Payer: BC Managed Care – PPO | Admitting: Physical Therapy

## 2019-08-28 ENCOUNTER — Other Ambulatory Visit: Payer: Self-pay

## 2019-08-28 ENCOUNTER — Encounter: Payer: Self-pay | Admitting: Physical Therapy

## 2019-08-28 DIAGNOSIS — M62838 Other muscle spasm: Secondary | ICD-10-CM

## 2019-08-28 DIAGNOSIS — M546 Pain in thoracic spine: Secondary | ICD-10-CM

## 2019-08-28 DIAGNOSIS — R293 Abnormal posture: Secondary | ICD-10-CM
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
## 2019-08-30 ENCOUNTER — Other Ambulatory Visit: Payer: Self-pay

## 2019-08-30 ENCOUNTER — Ambulatory Visit: Payer: BC Managed Care – PPO

## 2019-08-30 DIAGNOSIS — M546 Pain in thoracic spine: Secondary | ICD-10-CM

## 2019-08-30 DIAGNOSIS — M62838 Other muscle spasm: Secondary | ICD-10-CM

## 2019-08-30 DIAGNOSIS — R293 Abnormal posture: Secondary | ICD-10-CM
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
## 2019-08-30 NOTE — Patient Instructions (Signed)
  Access Code: [email or web address]: [email or web address]: 03/17/2021Prepared by: [email or web address]  Cat-Camel - 2 x daily - 7 x weekly - 1 sets - 5 reps - 5 sec hold

## 2019-09-04 ENCOUNTER — Ambulatory Visit: Payer: BC Managed Care – PPO

## 2019-09-06 ENCOUNTER — Encounter: Payer: Self-pay | Admitting: Physical Therapy

## 2019-09-06 ENCOUNTER — Ambulatory Visit: Payer: BC Managed Care – PPO | Admitting: Physical Therapy

## 2019-09-06 ENCOUNTER — Other Ambulatory Visit: Payer: Self-pay

## 2019-09-06 DIAGNOSIS — R293 Abnormal posture: Secondary | ICD-10-CM

## 2019-09-06 DIAGNOSIS — M546 Pain in thoracic spine: Secondary | ICD-10-CM

## 2019-09-06 DIAGNOSIS — M62838 Other muscle spasm: Secondary | ICD-10-CM
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
## 2019-09-07 ENCOUNTER — Encounter: Payer: Self-pay | Admitting: Physical Therapy

## 2019-09-07 ENCOUNTER — Ambulatory Visit: Payer: BC Managed Care – PPO | Admitting: Physical Therapy

## 2019-09-07 DIAGNOSIS — M62838 Other muscle spasm: Secondary | ICD-10-CM

## 2019-09-07 DIAGNOSIS — M546 Pain in thoracic spine: Secondary | ICD-10-CM

## 2019-09-07 DIAGNOSIS — R293 Abnormal posture: Secondary | ICD-10-CM
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
## 2019-09-09 ENCOUNTER — Ambulatory Visit: Payer: BC Managed Care – PPO | Attending: Internal Medicine

## 2019-09-09 DIAGNOSIS — Z23 Encounter for immunization: Secondary | ICD-10-CM

## 2019-09-09 NOTE — Progress Notes (Signed)
   Covid-19 Vaccination Clinic  Name:  Yvette Conley    MRN: [id number] DOB: 08-15-1985  09/09/2019  [name] was observed post Covid-19 immunization for 15 minutes without incident. She was provided with Vaccine Information Sheet and instruction to access the V-Safe system.   [name] was instructed to call 911 with any severe reactions post vaccine: Marland Kitchen Difficulty breathing  . Swelling of face and throat  . A fast heartbeat  . A bad rash all over body  . Dizziness and weakness   Immunizations Administered    Name Date Dose VIS Date Route   Pfizer COVID-19 Vaccine 09/09/2019  3:26 PM 0.3 mL 05/26/2019 Intramuscular   Manufacturer: [employer], [employer]   Lot: [id number]   NDC: [address]-8

## 2019-09-12 ENCOUNTER — Other Ambulatory Visit: Payer: Self-pay

## 2019-09-12 ENCOUNTER — Ambulatory Visit: Payer: BC Managed Care – PPO | Admitting: Physical Therapy

## 2019-09-12 ENCOUNTER — Encounter: Payer: Self-pay | Admitting: Physical Therapy

## 2019-09-12 DIAGNOSIS — M62838 Other muscle spasm: Secondary | ICD-10-CM

## 2019-09-12 DIAGNOSIS — R293 Abnormal posture: Secondary | ICD-10-CM

## 2019-09-12 DIAGNOSIS — M546 Pain in thoracic spine: Secondary | ICD-10-CM | POA: Diagnosis not present
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
## 2019-09-14 ENCOUNTER — Ambulatory Visit: Payer: BC Managed Care – PPO | Attending: Plastic Surgery | Admitting: Physical Therapy

## 2019-09-14 ENCOUNTER — Other Ambulatory Visit: Payer: Self-pay

## 2019-09-14 DIAGNOSIS — R293 Abnormal posture: Secondary | ICD-10-CM | POA: Insufficient documentation

## 2019-09-14 DIAGNOSIS — M62838 Other muscle spasm: Secondary | ICD-10-CM | POA: Insufficient documentation

## 2019-09-14 DIAGNOSIS — M546 Pain in thoracic spine: Secondary | ICD-10-CM | POA: Insufficient documentation
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
## 2019-09-22 ENCOUNTER — Ambulatory Visit
Admission: EM | Admit: 2019-09-22 | Discharge: 2019-09-22 | Disposition: A | Discharge status: Home / Self Care | Payer: BC Managed Care – PPO | Attending: Emergency Medicine | Admitting: Emergency Medicine

## 2019-09-22 ENCOUNTER — Other Ambulatory Visit: Payer: Self-pay

## 2019-09-22 DIAGNOSIS — R05 Cough: Secondary | ICD-10-CM | POA: Diagnosis present

## 2019-09-22 DIAGNOSIS — J029 Acute pharyngitis, unspecified: Secondary | ICD-10-CM

## 2019-09-22 DIAGNOSIS — R059 Cough, unspecified: Secondary | ICD-10-CM

## 2019-09-22 DIAGNOSIS — Z1152 Encounter for screening for COVID-19: Secondary | ICD-10-CM

## 2019-09-22 LAB — POCT RAPID STREP A (OFFICE): Rapid Strep A Screen: NEGATIVE

## 2019-09-22 MED ORDER — LIDOCAINE VISCOUS HCL 2 % MT SOLN: 15.0000 mL | OROMUCOSAL | 1 refills | Status: DC | PRN

## 2019-09-22 MED ORDER — MENTHOL 3 MG MT LOZG: 1.0000 | LOZENGE | OROMUCOSAL | 0 refills | Status: DC | PRN

## 2019-09-22 MED ORDER — CETIRIZINE HCL 10 MG PO TABS: 10.0000 mg | ORAL_TABLET | Freq: Every day | ORAL | 0 refills | Status: DC

## 2019-09-22 MED ORDER — PREDNISONE 10 MG PO TABS: 20.0000 mg | ORAL_TABLET | Freq: Every day | ORAL | 0 refills | Status: DC

## 2019-09-22 MED ORDER — BENZONATATE 100 MG PO CAPS: 100.0000 mg | ORAL_CAPSULE | Freq: Three times a day (TID) | ORAL | 0 refills | Status: DC

## 2019-09-22 MED ORDER — FLUTICASONE PROPIONATE 50 MCG/ACT NA SUSP: 1.0000 | Freq: Every day | NASAL | 0 refills | Status: DC

## 2019-09-22 NOTE — Discharge Instructions (Addendum)
POCT strep test was negative  COVID testing ordered.  It will take between 2-7 days for test results.  Someone will contact you regarding abnormal results.    In the meantime: You should remain isolated in your home for 10 days from symptom onset AND greater than 24 hours after symptoms resolution (absence of fever without the use of fever-reducing medication and improvement in respiratory symptoms), whichever is longer Get plenty of rest and push fluids Prednisone as prescribed Tessalon Perles prescribed for cough Zyrtec-D prescribed for nasal congestion, runny nose, and/or sore throat Flonase prescribed for nasal congestion and runny nose Use medications daily for symptom relief Use OTC medications like ibuprofen or tylenol as needed fever or pain Call or go to the ED if you have any new or worsening symptoms such as fever, worsening cough, shortness of breath, chest tightness, chest pain, turning blue, changes in mental status, etc..Marland Kitchen
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
## 2019-09-22 NOTE — ED Triage Notes (Signed)
Pt presents with c/o sore throat that began on Tuesday , pt also has developed productive cough, pt has had both covid vaccines

## 2019-09-25 LAB — NOVEL CORONAVIRUS, NAA: SARS-CoV-2, NAA: NOT DETECTED

## 2019-09-26 LAB — CULTURE, GROUP A STREP (THRC)

## 2019-09-28 ENCOUNTER — Ambulatory Visit (INDEPENDENT_AMBULATORY_CARE_PROVIDER_SITE_OTHER): Payer: BC Managed Care – PPO | Admitting: Plastic Surgery

## 2019-09-28 ENCOUNTER — Other Ambulatory Visit: Payer: Self-pay

## 2019-09-28 ENCOUNTER — Encounter: Payer: Self-pay | Admitting: Plastic Surgery

## 2019-09-28 VITALS — BP 112/78 | HR 69 | Temp 98.7°F | Ht 67.0 in | Wt 259.0 lb

## 2019-09-28 DIAGNOSIS — N62 Hypertrophy of breast: Secondary | ICD-10-CM | POA: Diagnosis not present

## 2019-09-28 NOTE — Progress Notes (Signed)
   Referring Provider Salley Scarlet, MD [address] [place],  [place] [address]   CC:  Chief Complaint  Patient presents with  . Follow-up    BL breast reduction      Yvette Conley is an 34 y.o. female.  HPI: Patient is back to discuss breast reduction after completing physical therapy.  It has not completely addressed her back pain.  She has been using heat and cool pads to try to help with the pain with limited success.  Her large black breasts are influencing her ability to care for her young daughter.  She is hoping her insurance company will approve her breast reduction so she can move along in the process.  Review of Systems General: Denies fevers, chills  Physical Exam Vitals with BMI 09/28/2019 09/22/2019 08/11/2019  Height 5\' 7"  - 5\' 7"   Weight 259 lbs - 251 lbs  BMI 40.56 - 39.3  Systolic 112 121  Diastolic 78 86 74  Pulse 69 111 100    General:  No acute distress,  Alert and oriented, Non-Toxic, Normal speech and affect Breast exam is unchanged.  She has grade 3 ptosis.  I do not see any obvious scars or masses.  Assessment/Plan The patient has bilateral symptomatic macromastia.  She is a good candidate for a breast reduction.  The details of breast reduction surgery were discussed.  I explained the procedure in detail along the with the expected scars.  The risks were discussed in detail and include bleeding, infection, damage to surrounding structures, need for additional procedures, nipple loss, change in nipple sensation, persistent pain, contour irregularities and asymmetries.  I explained that breast feeding is often not possible after breast reduction surgery.  We discussed the expected postoperative course with an overall recovery period of about 1 month.  She demonstrated full understanding of all risks.  We discussed her personal risk factors.  I anticipate approximately 1080 g of tissue removed from each side.   09/28/2019, 4:33 PM

## 2019-10-04 ENCOUNTER — Ambulatory Visit: Payer: BC Managed Care – PPO | Admitting: Physical Therapy

## 2019-10-12 ENCOUNTER — Other Ambulatory Visit: Payer: Self-pay | Admitting: Family Medicine

## 2019-10-18 ENCOUNTER — Encounter: Payer: BC Managed Care – PPO | Admitting: Physical Therapy

## 2019-10-25 ENCOUNTER — Ambulatory Visit: Payer: BC Managed Care – PPO | Admitting: Plastic Surgery

## 2019-11-22 ENCOUNTER — Ambulatory Visit (INDEPENDENT_AMBULATORY_CARE_PROVIDER_SITE_OTHER): Payer: BC Managed Care – PPO | Admitting: Plastic Surgery

## 2019-11-22 ENCOUNTER — Other Ambulatory Visit: Payer: Self-pay

## 2019-11-22 ENCOUNTER — Encounter: Payer: Self-pay | Admitting: Plastic Surgery

## 2019-11-22 VITALS — BP 122/83 | HR 96 | Temp 98.0°F

## 2019-11-22 DIAGNOSIS — Z411 Encounter for cosmetic surgery: Secondary | ICD-10-CM

## 2019-11-22 NOTE — Progress Notes (Signed)
Patient presents to discuss having an abdominoplasty to her upcoming breast reduction.  She is dissatisfied with the contour of her abdomen and the excess skin and adipose tissue.  She has lost quite a bit of weight at one point but her weight has been stable for at least 6 months.  She is had no previous abdominal operations.  On exam she has at least moderate skin excess in the infra and supraumbilical areas.  I do not see any obvious scars and do not detect any hernias.  She does have a's certain component of intra-abdominal fat and likely some abdominal wall laxity as well.  I long discussion with the patient about combining abdominoplasty with her breast reduction.  I think it is reasonable to do so in her case.  I explained the details of the operation to her and explained the location of the scars.  I think this would give her a very nice correction but does involve a more challenging recovery.  I explained the need to continue to wear compressive garment postoperatively and the need for drains for the abdominal portion of the procedure.  I discussed I would combine liposuction in specific areas as needed and would do conservative liposuction of the abdominoplasty flap.  We discussed the risks include bleeding, infection, damage to surrounding structures, need for additional procedures.  We discussed rare complications including blood clots and anesthesia related complications.  All of her questions were answered and we will give her a quote for this.

## 2019-11-23 DIAGNOSIS — Z719 Counseling, unspecified: Secondary | ICD-10-CM

## 2019-11-24 DIAGNOSIS — Z719 Counseling, unspecified: Secondary | ICD-10-CM
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
## 2019-12-13 ENCOUNTER — Other Ambulatory Visit: Payer: Self-pay

## 2019-12-13 ENCOUNTER — Encounter: Payer: Self-pay | Admitting: Plastic Surgery

## 2019-12-13 ENCOUNTER — Ambulatory Visit (INDEPENDENT_AMBULATORY_CARE_PROVIDER_SITE_OTHER): Payer: BC Managed Care – PPO | Admitting: Plastic Surgery

## 2019-12-13 VITALS — BP 110/71 | HR 114 | Temp 97.5°F | Wt 258.4 lb

## 2019-12-13 DIAGNOSIS — Z411 Encounter for cosmetic surgery: Secondary | ICD-10-CM

## 2019-12-13 DIAGNOSIS — Z719 Counseling, unspecified: Secondary | ICD-10-CM

## 2019-12-13 DIAGNOSIS — N62 Hypertrophy of breast: Secondary | ICD-10-CM

## 2019-12-13 MED ORDER — HYDROCODONE-ACETAMINOPHEN 5-325 MG PO TABS: 1.0000 | ORAL_TABLET | Freq: Three times a day (TID) | ORAL | 0 refills | Status: AC | PRN

## 2019-12-13 MED ORDER — ONDANSETRON HCL 4 MG PO TABS: 4.0000 mg | ORAL_TABLET | Freq: Three times a day (TID) | ORAL | 0 refills | Status: DC | PRN

## 2019-12-14 ENCOUNTER — Other Ambulatory Visit (INDEPENDENT_AMBULATORY_CARE_PROVIDER_SITE_OTHER): Payer: BC Managed Care – PPO | Admitting: Plastic Surgery

## 2019-12-14 DIAGNOSIS — Z411 Encounter for cosmetic surgery: Secondary | ICD-10-CM

## 2019-12-14 MED ORDER — PROMETHAZINE HCL 12.5 MG PO TABS: 12.5000 mg | ORAL_TABLET | Freq: Three times a day (TID) | ORAL | 0 refills | Status: DC | PRN

## 2019-12-14 NOTE — Progress Notes (Signed)
Her insurance requires preauthorization for Zofran. Sent Rx for Phenergan instead.

## 2019-12-15 ENCOUNTER — Other Ambulatory Visit: Payer: Self-pay | Admitting: Family Medicine

## 2019-12-15 NOTE — Telephone Encounter (Signed)
Requested Prescriptions   Pending Prescriptions Disp Refills  . cyclobenzaprine (FLEXERIL) 10 MG tablet [Pharmacy Med Name: CYCLOBENZAPRINE 10MG  TABLETS] 30 tablet 1    Sig: TAKE 1 TABLET(10 MG) BY MOUTH THREE TIMES DAILY AS NEEDED FOR MUSCLE SPASMS    Last OV 08/11/2019   Last ordered 08/02/2019

## 2019-12-19 ENCOUNTER — Other Ambulatory Visit: Payer: Self-pay | Admitting: Plastic Surgery

## 2019-12-19 DIAGNOSIS — N62 Hypertrophy of breast: Secondary | ICD-10-CM | POA: Diagnosis not present

## 2019-12-26 ENCOUNTER — Ambulatory Visit (INDEPENDENT_AMBULATORY_CARE_PROVIDER_SITE_OTHER): Payer: BC Managed Care – PPO | Admitting: Surgical

## 2019-12-26 ENCOUNTER — Other Ambulatory Visit: Payer: Self-pay

## 2019-12-26 ENCOUNTER — Encounter: Payer: Self-pay | Admitting: Surgical

## 2019-12-26 VITALS — BP 106/77 | HR 90 | Temp 98.0°F

## 2019-12-26 DIAGNOSIS — Z411 Encounter for cosmetic surgery: Secondary | ICD-10-CM

## 2019-12-26 DIAGNOSIS — N62 Hypertrophy of breast: Secondary | ICD-10-CM

## 2019-12-26 NOTE — Progress Notes (Signed)
   Subjective:     Patient ID: Yvette Conley, female    DOB: [birth year], 34 y.o.   MRN: [id number]  Chief Complaint  Patient presents with  . Post-op Follow-up    HPI: The patient is a 34 y.o. female here for follow-up after bilateral breast reduction and abdominoplasty approximately 1 week ago with Dr. Arita Miss.  Patient is here with her mother today.  She is overall doing well.  She reports that she has been sleeping in a reclined position.  She has been wearing the Ace bandages around her bilateral breasts and the abdominal binder around her abdomen.  She has been receiving assistance with dressing changes from her husband and he has been doing a great job.  She reports drain output has been greater on the left side to the right side.  Overall it has been approximately 25 cc out of each drain every 24 hours.  The drainage today is serosanguineous.  She is not having any fevers, chills, vomiting, chest pain, shortness of breath, leg swelling.  She does have a little bit of nausea, but reports this is not uncommon for her due to her IBS.  Review of Systems  Constitutional: Negative.   Respiratory: Negative.   Cardiovascular: Negative.   Gastrointestinal: Positive for nausea. Negative for abdominal distention and vomiting.  Neurological: Negative.      Objective:   Vital Signs BP 106/77 (BP Location: Right Arm, Patient Position: Sitting, Cuff Size: Large)   Pulse 90   Temp 98 F (36.7 C) (Temporal)   SpO2 99%  Vital Signs and Nursing Note Reviewed Chaperone present Physical Exam Constitutional:      General: She is not in acute distress.    Appearance: She is not ill-appearing.  HENT:     Head: Normocephalic and atraumatic.  Cardiovascular:     Rate and Rhythm: Normal rate.  Pulmonary:     Effort: Pulmonary effort is normal.  Chest:    Abdominal:     General: There is no distension.     Tenderness: There is abdominal tenderness. There is no guarding.    Neurological:       General: No focal deficit present.     Mental Status: She is alert and oriented to person, place, and time.  Psychiatric:        Mood and Affect: Mood normal.        Behavior: Behavior normal.       Assessment/Plan:     ICD-10-CM   1. Encounter for cosmetic procedure  Z41.1   2. Macromastia  N62     Mrs. Recore is doing really well.  There is no sign of any infection, seroma, hematoma.  Right JP drain was removed without any issue, patient tolerated this well.  Recommend continuing to wear sports bra 24/7 as well as abdominal binder 24/7.  Continue to sleep in a reclined position.  Avoid strenuous activities or heavy lifting.  New Steri-Strips applied to some of the breast incisions as well as the abdominal incisions.  Patient is pleased with her progress thus far.   Recommend continuing to record drainage out of left JP drain.  Follow-up in 1 week.   Recommend calling with questions or concerns.    Kermit Balo Frankye Schwegel, PA-C 12/26/2019, 11:24 AM

## 2019-12-29 ENCOUNTER — Other Ambulatory Visit: Payer: Self-pay

## 2019-12-29 ENCOUNTER — Ambulatory Visit (INDEPENDENT_AMBULATORY_CARE_PROVIDER_SITE_OTHER): Payer: BC Managed Care – PPO | Admitting: Surgical

## 2019-12-29 ENCOUNTER — Encounter: Payer: Self-pay | Admitting: Surgical

## 2019-12-29 VITALS — BP 110/78 | HR 110 | Temp 98.2°F

## 2019-12-29 DIAGNOSIS — Z411 Encounter for cosmetic surgery: Secondary | ICD-10-CM

## 2019-12-29 DIAGNOSIS — N62 Hypertrophy of breast: Secondary | ICD-10-CM

## 2019-12-29 MED ORDER — DOXYCYCLINE HYCLATE 100 MG PO TABS: 100.0000 mg | ORAL_TABLET | Freq: Two times a day (BID) | ORAL | 0 refills | Status: AC

## 2019-12-29 MED ORDER — TRIAMCINOLONE ACETONIDE 0.025 % EX OINT: 1.0000 "application " | TOPICAL_OINTMENT | Freq: Two times a day (BID) | CUTANEOUS | 0 refills | Status: DC

## 2019-12-29 NOTE — Progress Notes (Signed)
Patient is a 34 year old female here for follow-up after bilateral breast reduction and abdominoplasty on 12/19/2019 with Dr. Arita Miss.  Patient is 10 days postop.  She is here for evaluation of left JP drain  She reports she is overall doing well.  She does note that she has had a little bit of an itch along the left lateral breast.  She has not had any fevers, chills, nausea, vomiting.  Constipation is improved.  She reports that the JP drain output has been less than 20 cc for the past 3 days.  Yesterday put out 16 cc of serosanguineous fluid.  2 days ago put out 13 cc of serosanguineous fluid and this morning and had 5 cc.    On exam patient is well-developed, well-nourished, no acute distress, does not appear diaphoretic.  bilateral breast incisions are C/D/I, she does have what appears to be a rash developing along the bilateral NAC's and inframammary fold of the right breast.  Bilateral breasts are soft, no fluid wave noted.  She also has rash developing along the right JP drain insertion site where she had placed an adhesive bandage.  Abdominal incisions are C/D/I without any dehiscence noted.  Some drainage noted on Steri-Strips.  No cellulitis noted.  Abdomen is soft, no fluid wave noted.  Left JP drain removed due to low output. Continue to wear abdominal binder 24/7 to prevent fluid collection. Apply Vaseline to right lateral and left lateral breast incisions. Continue to wear sports bra 24/7. Steroid cream called into pharmacy for patient to apply around rash on bilateral breasts and right lower abdomen near right JP drain insertion site.  Avoid applying cream on incisions.  Recommend Benadryl at night and Allegra or Zyrtec during the day for itching.

## 2020-01-03 ENCOUNTER — Other Ambulatory Visit: Payer: Self-pay

## 2020-01-03 ENCOUNTER — Encounter: Payer: Self-pay | Admitting: Plastic Surgery

## 2020-01-03 ENCOUNTER — Ambulatory Visit (INDEPENDENT_AMBULATORY_CARE_PROVIDER_SITE_OTHER): Payer: BC Managed Care – PPO | Admitting: Plastic Surgery

## 2020-01-03 VITALS — BP 102/69 | HR 96 | Temp 98.2°F

## 2020-01-03 DIAGNOSIS — N62 Hypertrophy of breast: Secondary | ICD-10-CM

## 2020-01-03 NOTE — Progress Notes (Signed)
Patient is here postop from bilateral breast reduction and abdominoplasty.  She is very happy with her result.  Both of her drains have already been removed.  She is about 2 weeks out at this point.  Examination shows nicely healed incisions throughout the breast and the abdomen.  There is minimal swelling or any evidence of subcutaneous fluid collections.  Nipple areolar complexes are viable.  Her umbilicus is healing fine.  I have encouraged her to continue to wear compressive garments and to continue to avoid strenuous activity.  We will plan to see her again in a few weeks.  All of her questions were answered.

## 2020-01-11 ENCOUNTER — Encounter: Payer: BC Managed Care – PPO | Admitting: Plastic Surgery

## 2020-01-24 ENCOUNTER — Other Ambulatory Visit: Payer: Self-pay

## 2020-01-24 ENCOUNTER — Encounter: Payer: Self-pay | Admitting: Plastic Surgery

## 2020-01-24 ENCOUNTER — Ambulatory Visit (INDEPENDENT_AMBULATORY_CARE_PROVIDER_SITE_OTHER): Payer: BC Managed Care – PPO | Admitting: Plastic Surgery

## 2020-01-24 VITALS — BP 121/71 | HR 115 | Temp 98.1°F

## 2020-01-24 DIAGNOSIS — N62 Hypertrophy of breast: Secondary | ICD-10-CM

## 2020-01-24 NOTE — Progress Notes (Signed)
Patient is here little over a month postop from bilateral breast reduction and abdominoplasty.  She feels great.  She has no complaints.  On exam everything is healing well.  Breasts have good shape size and symmetry.  The scarring is appropriate for the stage and healing process.  Her abdomen also looks great.  She has a very small ulceration at the inferior aspect of her incision on the left side but this is less than a centimeter in size.  Otherwise the shape and contour are great.  I have asked her to continue to avoid strenuous activity for another couple weeks at which point she can begin to increase her activity level.  She asked about going swimming and that is fine at this point.  We will plan to schedule another appointment about 2 months from now to have a time on the books to reevaluate any other issues that come up.  She knows if she has any issues in the meantime she can call and be seen sooner.  Photographs were taken today demonstrating a nice postoperative result.

## 2020-02-01 ENCOUNTER — Telehealth: Payer: Self-pay

## 2020-02-01 MED ORDER — TRIAMCINOLONE ACETONIDE 0.025 % EX OINT: 1.0000 "application " | TOPICAL_OINTMENT | Freq: Two times a day (BID) | CUTANEOUS | 0 refills | Status: DC

## 2020-02-01 NOTE — Telephone Encounter (Signed)
Patient called requesting repeat prescription of triamcinolone ointment. She states that although the rash on her breasts had improved it has now returned.

## 2020-02-01 NOTE — Telephone Encounter (Signed)
Patient called the office reporting that she noticed the rash on her breasts has recurred.  She reports she is otherwise feeling well.  She is not having any fevers or chills.  She reports that the rash has been present for approximately 1 week.  She also has noticed some redness over her breasts.  She reports she still has 4 days worth of doxycycline.  I discussed with her that she could finish this prescription, but to be sure that she completes the entire course.  I recommend continuing with Benadryl and Zyrtec.  If the symptoms do not resolve or worsen to please call us and I would like to see her in the office.  Patient is in agreement with current plan.  Prescription for triamcinolone steroid cream sent to pharmacy.

## 2020-02-01 NOTE — Addendum Note (Signed)
Addended byKeenan Bachelor on: 02/01/2020 11:40 AM   Modules accepted: Orders

## 2020-03-04 ENCOUNTER — Telehealth: Payer: Self-pay | Admitting: Plastic Surgery

## 2020-03-04 NOTE — Telephone Encounter (Signed)
Returned patients call. She indicated she has been breaking out in hives around her torso, incision area, arms, legs, suture area above pubic area along with having a burning sensation, hot to touch, and diarrhea since this past Saturday night. She has been taking benadryl which only helps her at night to sleep. The steroid cream does not work as well. She had finished the doxycyline back in August and has not tried any new medications, laundry detergent, or lotions/creams. She also indicated some of her sutures where coming through the skin. Advised patient to continue taking the benadryl and one of the staff at the front desk will call and schedule her to come in and see Matt this week. Patient agreed and understood.

## 2020-03-04 NOTE — Telephone Encounter (Signed)
Patient called to say that she believes she is having a bit of an allergic reaction and it feels like some sutures are popping out of the skin. Please call patient to advise.

## 2020-03-05 ENCOUNTER — Ambulatory Visit (INDEPENDENT_AMBULATORY_CARE_PROVIDER_SITE_OTHER): Payer: BC Managed Care – PPO | Admitting: Family Medicine

## 2020-03-05 ENCOUNTER — Ambulatory Visit (INDEPENDENT_AMBULATORY_CARE_PROVIDER_SITE_OTHER): Payer: BC Managed Care – PPO | Admitting: Surgical

## 2020-03-05 ENCOUNTER — Encounter: Payer: Self-pay | Admitting: Surgical

## 2020-03-05 ENCOUNTER — Other Ambulatory Visit: Payer: Self-pay

## 2020-03-05 ENCOUNTER — Encounter: Payer: Self-pay | Admitting: Family Medicine

## 2020-03-05 VITALS — BP 120/84 | HR 107 | Temp 98.3°F | Ht 67.0 in | Wt 247.0 lb

## 2020-03-05 VITALS — HR 93 | Temp 98.3°F

## 2020-03-05 DIAGNOSIS — Z411 Encounter for cosmetic surgery: Secondary | ICD-10-CM

## 2020-03-05 DIAGNOSIS — N62 Hypertrophy of breast: Secondary | ICD-10-CM

## 2020-03-05 DIAGNOSIS — N61 Mastitis without abscess: Secondary | ICD-10-CM | POA: Diagnosis not present

## 2020-03-05 DIAGNOSIS — L282 Other prurigo: Secondary | ICD-10-CM

## 2020-03-05 MED ORDER — MOMETASONE FUROATE 0.1 % EX CREA: 1.0000 "application " | TOPICAL_CREAM | Freq: Every day | CUTANEOUS | 0 refills | Status: DC

## 2020-03-05 MED ORDER — CEPHALEXIN 500 MG PO CAPS: 500.0000 mg | ORAL_CAPSULE | Freq: Three times a day (TID) | ORAL | 0 refills | Status: DC

## 2020-03-05 NOTE — Progress Notes (Signed)
Patient is a 34 year old female here for follow-up after bilateral breast reduction and abdominoplasty on 12/19/2019 with Dr. Arita Miss.  She called the office yesterday stating that she had a rash/itching on her torso and breasts, also noted that she had a few sutures protruding through the skin that were bothering her.  Patient reports that she has not had any new lotions, laundry detergents, soaps.  She does report that one of her children also has the rash on their body and has seen there PCP who believes it may be chiggers.  Chaperone present on exam On exam patient has rash over abdomen and bilateral breasts, central scab noted which appears to be an insect bite.  Panniculectomy and bilateral breast reduction incisions are intact, well-healed.  She does have a suture protruding just below the vertical limb and inframammary fold junction on the left breast as well as what appears to be a dissolving staple near the mons pubis inferior to panniculectomy incision.  No sign of infection, seroma, hematoma.  Bilateral breasts are symmetric, NAC's are viable.  Discussed with patient I do not know the etiology of the rash, however appears consistent with insect bite.  Patient reports she has a appointment with her PCP this afternoon to be evaluated.  She has been using a steroid cream which has been helpful.  I recommend further discussing this with her PCP.  In regards to her bilateral breast reduction and panniculectomy, patient is doing really well.  There is no sign of infection, seroma, hematoma.  Incisions are well-healed.  She has a follow-up scheduled in approximately 1 month with Dr. Arita Miss for reevaluation.  Recommend calling with any questions or concerns prior to that appointment.
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
## 2020-03-07 ENCOUNTER — Ambulatory Visit
Admission: EM | Admit: 2020-03-07 | Discharge: 2020-03-07 | Disposition: A | Discharge status: Home / Self Care | Payer: BC Managed Care – PPO | Attending: Emergency Medicine | Admitting: Emergency Medicine

## 2020-03-07 ENCOUNTER — Other Ambulatory Visit: Payer: Self-pay

## 2020-03-07 DIAGNOSIS — J069 Acute upper respiratory infection, unspecified: Secondary | ICD-10-CM | POA: Diagnosis not present

## 2020-03-07 DIAGNOSIS — Z1152 Encounter for screening for COVID-19: Secondary | ICD-10-CM

## 2020-03-07 DIAGNOSIS — Z20822 Contact with and (suspected) exposure to covid-19: Secondary | ICD-10-CM

## 2020-03-07 MED ORDER — BENZONATATE 100 MG PO CAPS: 100.0000 mg | ORAL_CAPSULE | Freq: Three times a day (TID) | ORAL | 0 refills | Status: DC
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
## 2020-03-07 NOTE — ED Triage Notes (Signed)
Pt presents with cough and headache for past couple days, also has back pain with deep breath that began today

## 2020-03-07 NOTE — Discharge Instructions (Signed)

## 2020-03-09 LAB — NOVEL CORONAVIRUS, NAA: SARS-CoV-2, NAA: NOT DETECTED

## 2020-03-09 LAB — SARS-COV-2, NAA 2 DAY TAT

## 2020-03-27 ENCOUNTER — Other Ambulatory Visit: Payer: Self-pay

## 2020-03-27 ENCOUNTER — Encounter: Payer: Self-pay | Admitting: Plastic Surgery

## 2020-03-27 ENCOUNTER — Ambulatory Visit (INDEPENDENT_AMBULATORY_CARE_PROVIDER_SITE_OTHER): Payer: BC Managed Care – PPO | Admitting: Plastic Surgery

## 2020-03-27 VITALS — BP 144/77 | HR 90 | Temp 98.1°F

## 2020-03-27 DIAGNOSIS — N62 Hypertrophy of breast: Secondary | ICD-10-CM

## 2020-03-27 NOTE — Progress Notes (Signed)
Patient is here 2 months out from bilateral breast reduction and tummy tuck.  She feels great and is very happy.  On examination is everything is healed nicely and I do not see any spitting sutures or any wounds.  She has great shape and size to the breast and the abdomen is healed great.  She did say she had a fall a few weeks ago but I cannot detect any evidence of that causing any issue with her surgical result.  At this point we will plan to make another appointment 2 to 3 months from now which she can cancel if she finds that appointment not necessary.  All her questions were answered and we will plan to see her then.

## 2020-06-27 ENCOUNTER — Ambulatory Visit: Payer: BC Managed Care – PPO | Admitting: Plastic Surgery

## 2020-08-08 ENCOUNTER — Other Ambulatory Visit: Payer: Self-pay

## 2020-08-08 ENCOUNTER — Ambulatory Visit (INDEPENDENT_AMBULATORY_CARE_PROVIDER_SITE_OTHER): Payer: BC Managed Care – PPO | Admitting: Nurse Practitioner

## 2020-08-08 ENCOUNTER — Encounter: Payer: Self-pay | Admitting: Nurse Practitioner

## 2020-08-08 VITALS — BP 118/82 | Temp 97.9°F | Ht 67.0 in | Wt 254.6 lb

## 2020-08-08 DIAGNOSIS — G43009 Migraine without aura, not intractable, without status migrainosus: Secondary | ICD-10-CM

## 2020-08-08 DIAGNOSIS — R1032 Left lower quadrant pain: Secondary | ICD-10-CM | POA: Diagnosis not present

## 2020-08-08 DIAGNOSIS — R7982 Elevated C-reactive protein (CRP): Secondary | ICD-10-CM | POA: Diagnosis not present

## 2020-08-08 DIAGNOSIS — K582 Mixed irritable bowel syndrome: Secondary | ICD-10-CM

## 2020-08-08 MED ORDER — SUMATRIPTAN SUCCINATE 50 MG PO TABS: 50.0000 mg | ORAL_TABLET | Freq: Once | ORAL | 0 refills | Status: DC

## 2020-08-08 MED ORDER — KETOROLAC TROMETHAMINE 60 MG/2ML IM SOLN
60.0000 mg | Freq: Once | INTRAMUSCULAR | Status: AC
  Administered 2020-08-08: 60 mg via INTRAMUSCULAR

## 2020-08-08 MED ORDER — NAPROXEN 500 MG PO TABS: 500.0000 mg | ORAL_TABLET | Freq: Two times a day (BID) | ORAL | 0 refills | Status: DC
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
## 2020-08-08 NOTE — Assessment & Plan Note (Addendum)
Chronic, ongoing.  Unclear if present symptoms are an acute flare of IBS, a possible viral gastroenteritis, or possibly colitis.  COVID swab obtained.  Concerned with LLQ pain, although does have pain diffusely and abdomen is soft.  Will obtain CBC, CMP and CRP to rule out acute process.  If CRP or WBC elevated, will order imaging. Otherwise, start soft food diet or clear liquids for next couple of days.  If symptoms do not gradually improve after 7-10 days of onset, consider elimination diet starting with lactose.  Follow up in 1-2 weeks if no better.

## 2020-08-08 NOTE — Assessment & Plan Note (Addendum)
Acute, ongoing x weeks.  Neurological examination today normal.  COVID swab obtained.  Will start with abortive therapy; Toradol 60 mg IM given in office today.  Start imitrex and can alternate Tylenol and naproxen as well.  Some concern with further irritation of GI tract with NSAID, however has not taken any NSAIDs for current migraine.  Educated to stop NSAID if GI irritation worsens.  Follow up as needed.

## 2020-08-08 NOTE — Patient Instructions (Addendum)
F/u as needed  We will let you know about blood work tomorrow.  For now, try to eat only liquid foods (applesauce, pudding, jello, or clear liquids).   COVID test results will be available over the weekend.  I recommend isolating at home until we know the results. Prescriptions are at the pharmacy.  Do not take nabumetone and naproxen together.  You can alternate Tylenol and naproxen.  Be sure to eat at least something small when you take them. If your abdominal pain/diarrhea continues for the next few days, around Monday, try to eliminate dairy from your diet.  If this doesn't help after 2 weeks, come back to the clinic to see [place].

## 2020-08-09 LAB — COMPLETE METABOLIC PANEL WITH GFR
AG Ratio: 1.5 (calc) (ref 1.0–2.5)
ALT: 36 U/L — ABNORMAL HIGH (ref 6–29)
AST: 27 U/L (ref 10–30)
Albumin: 4.4 g/dL (ref 3.6–5.1)
Alkaline phosphatase (APISO): 52 U/L (ref 31–125)
BUN: 14 mg/dL (ref 7–25)
CO2: 28 mmol/L (ref 20–32)
Calcium: 9.5 mg/dL (ref 8.6–10.2)
Chloride: 105 mmol/L (ref 98–110)
Creat: 0.68 mg/dL (ref 0.50–1.10)
GFR, Est African American: 134 mL/min/{1.73_m2} (ref >=60)
GFR, Est Non African American: 116 mL/min/{1.73_m2} (ref >=60)
Globulin: 3 g/dL (calc) (ref 1.9–3.7)
Glucose, Bld: 108 mg/dL — ABNORMAL HIGH (ref 65–99)
Potassium: 4.2 mmol/L (ref 3.5–5.3)
Sodium: 141 mmol/L (ref 135–146)
Total Bilirubin: 0.3 mg/dL (ref 0.2–1.2)
Total Protein: 7.4 g/dL (ref 6.1–8.1)

## 2020-08-09 LAB — CBC WITH DIFFERENTIAL/PLATELET
Absolute Monocytes: 875 cells/uL (ref 200–950)
Basophils Absolute: 54 cells/uL (ref 0–200)
Basophils Relative: 0.5 %
Eosinophils Absolute: 151 cells/uL (ref 15–500)
Eosinophils Relative: 1.4 %
HCT: 35.8 % (ref 35.0–45.0)
Hemoglobin: 12.1 g/dL (ref 11.7–15.5)
Lymphs Abs: 2236 cells/uL (ref 850–3900)
MCH: 30.3 pg (ref 27.0–33.0)
MCHC: 33.8 g/dL (ref 32.0–36.0)
MCV: 89.7 fL (ref 80.0–100.0)
MPV: 10.4 fL (ref 7.5–12.5)
Monocytes Relative: 8.1 %
Neutro Abs: 7484 cells/uL (ref 1500–7800)
Neutrophils Relative %: 69.3 %
Platelets: 365 10*3/uL (ref 140–400)
RBC: 3.99 10*6/uL (ref 3.80–5.10)
RDW: 12.3 % (ref 11.0–15.0)
Total Lymphocyte: 20.7 %
WBC: 10.8 10*3/uL (ref 3.8–10.8)

## 2020-08-09 LAB — SARS-COV-2 RNA,(COVID-19) QUALITATIVE NAAT: SARS CoV2 RNA: NOT DETECTED

## 2020-08-09 LAB — C-REACTIVE PROTEIN: CRP: 21.9 mg/L — ABNORMAL HIGH (ref <8.0)

## 2020-08-09 NOTE — Addendum Note (Signed)
Addended by: Cathlean Marseilles A on: 08/09/2020 08:04 AM   Modules accepted: Orders

## 2020-08-13 ENCOUNTER — Telehealth: Payer: Self-pay | Admitting: Nurse Practitioner

## 2020-08-13 NOTE — Telephone Encounter (Signed)
Received denial from insurance for CT of Abdomen/Pelvis.  Call placed to patient to check on symptoms.    If symptoms are still present and not improved, will call insurance to see how we can move forward with imaging.  If symptoms have improved, scan may not be needed anymore.  If she returns call, please see how she is doing and provide update.

## 2020-08-14 ENCOUNTER — Encounter: Payer: Self-pay | Admitting: Nurse Practitioner

## 2020-08-14 NOTE — Telephone Encounter (Signed)
Call placed to patient to discuss CT scan denial and abdominal symptoms.  No answer and mailbox full.

## 2020-08-20 ENCOUNTER — Telehealth: Payer: Self-pay

## 2020-08-20 DIAGNOSIS — G43009 Migraine without aura, not intractable, without status migrainosus: Secondary | ICD-10-CM

## 2020-08-20 NOTE — Telephone Encounter (Signed)
Pharm faxed med refill naproxen (NAPROSYN) 500 MG tablet   Pharmacy: [facility]

## 2020-08-21 MED ORDER — NAPROXEN 500 MG PO TABS: 500.0000 mg | ORAL_TABLET | Freq: Two times a day (BID) | ORAL | 0 refills | Status: DC

## 2020-08-21 NOTE — Addendum Note (Signed)
Addended by: Phillips Odor on: 08/21/2020 05:49 PM   Modules accepted: Orders

## 2020-08-30 ENCOUNTER — Other Ambulatory Visit: Payer: Self-pay | Admitting: Nurse Practitioner

## 2020-08-30 DIAGNOSIS — G43009 Migraine without aura, not intractable, without status migrainosus: Secondary | ICD-10-CM

## 2020-09-02 MED ORDER — SUMATRIPTAN SUCCINATE 50 MG PO TABS: 50.0000 mg | ORAL_TABLET | Freq: Once | ORAL | 0 refills | Status: DC

## 2020-10-06 ENCOUNTER — Other Ambulatory Visit: Payer: Self-pay | Admitting: Family Medicine

## 2020-10-07 NOTE — Telephone Encounter (Signed)
Ok to refill 

## 2021-02-18 ENCOUNTER — Telehealth: Payer: Self-pay

## 2021-02-18 MED ORDER — NIRMATRELVIR/RITONAVIR (PAXLOVID)TABLET: 3.0000 | ORAL_TABLET | Freq: Two times a day (BID) | ORAL | 0 refills | Status: AC

## 2021-02-18 NOTE — Telephone Encounter (Signed)
Patient tested positive for COVID on 02/17/2021.   Sx began 02/12/2021 and include HA, chills, dizziness, eye irritation, body aches.  Advised to continue symptom management with OTC medications: Tylenol/ Ibuprofen for fever/ body aches, Mucinex/ Delsym for cough/ chest congestion, Afrin/Sudafed/nasal saline for sinus pressure/ nasal congestion.  GFR 134. Order for Paxlovid sent to pharmacy. Advised possible SE include nausea, diarrhea, loss of taste and hypertension.  If chest pain, shortness of breath, fever >104 that is unresponsive to antipyretics noted, or if unable to tolerate fluids, advised to go to ER for evaluation.   Advised that the CDC recommends the following criteria prior to ending isolation in vaccinated and unvaccinated persons: at least 5 days since symptoms onset, then an additional 5 days of masking  AND 3 days fever free without antipyretics (Tylenol or Ibuprofen) AND improvement in respiratory symptoms.

## 2021-02-18 NOTE — Telephone Encounter (Signed)
Pt called in stating that she has tested positive for covid and would like to know if there was any meds  that she could take for her symptoms. Please call.  Cb#: [phone]
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
## 2021-03-05 ENCOUNTER — Telehealth (INDEPENDENT_AMBULATORY_CARE_PROVIDER_SITE_OTHER): Payer: BC Managed Care – PPO | Admitting: Nurse Practitioner

## 2021-03-05 ENCOUNTER — Other Ambulatory Visit: Payer: Self-pay

## 2021-03-05 DIAGNOSIS — U071 COVID-19: Secondary | ICD-10-CM

## 2021-03-05 DIAGNOSIS — R059 Cough, unspecified: Secondary | ICD-10-CM | POA: Diagnosis not present

## 2021-03-05 MED ORDER — BENZONATATE 100 MG PO CAPS
100.0000 mg | ORAL_CAPSULE | Freq: Three times a day (TID) | ORAL | 0 refills | Status: DC | PRN
Start: 2021-03-05 — End: 2021-06-04

## 2021-03-07 ENCOUNTER — Encounter: Payer: Self-pay | Admitting: Nurse Practitioner

## 2021-05-14 ENCOUNTER — Other Ambulatory Visit: Payer: Self-pay

## 2021-05-14 ENCOUNTER — Encounter: Payer: Self-pay | Admitting: Nurse Practitioner

## 2021-05-14 ENCOUNTER — Telehealth: Payer: BC Managed Care – PPO | Admitting: Nurse Practitioner

## 2021-05-14 DIAGNOSIS — B9689 Other specified bacterial agents as the cause of diseases classified elsewhere: Secondary | ICD-10-CM

## 2021-05-14 DIAGNOSIS — J019 Acute sinusitis, unspecified: Secondary | ICD-10-CM

## 2021-05-14 MED ORDER — AMOXICILLIN-POT CLAVULANATE 875-125 MG PO TABS: 1.0000 | ORAL_TABLET | Freq: Two times a day (BID) | ORAL | 0 refills | Status: DC
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
## 2021-05-15 ENCOUNTER — Encounter: Payer: Self-pay | Admitting: Nurse Practitioner

## 2021-05-15 DIAGNOSIS — E559 Vitamin D deficiency, unspecified: Secondary | ICD-10-CM | POA: Insufficient documentation

## 2021-05-15 DIAGNOSIS — D509 Iron deficiency anemia, unspecified: Secondary | ICD-10-CM | POA: Insufficient documentation

## 2021-05-15 DIAGNOSIS — R5383 Other fatigue: Secondary | ICD-10-CM | POA: Insufficient documentation

## 2021-05-15 DIAGNOSIS — R6882 Decreased libido: Secondary | ICD-10-CM | POA: Insufficient documentation

## 2021-05-15 DIAGNOSIS — R232 Flushing: Secondary | ICD-10-CM | POA: Insufficient documentation

## 2021-05-15 DIAGNOSIS — Z780 Asymptomatic menopausal state: Secondary | ICD-10-CM | POA: Insufficient documentation

## 2021-05-15 DIAGNOSIS — M797 Fibromyalgia: Secondary | ICD-10-CM | POA: Insufficient documentation

## 2021-05-15 MED ORDER — DOXYCYCLINE HYCLATE 100 MG PO TABS
100.0000 mg | ORAL_TABLET | Freq: Two times a day (BID) | ORAL | 0 refills | Status: DC
Start: 2021-05-15 — End: 2021-06-04

## 2021-05-15 NOTE — Addendum Note (Signed)
Addended by: Cathlean Marseilles A on: 05/15/2021 08:43 AM   Modules accepted: Orders

## 2021-06-04 ENCOUNTER — Encounter: Payer: Self-pay | Admitting: Nurse Practitioner

## 2021-06-04 ENCOUNTER — Ambulatory Visit: Payer: BC Managed Care – PPO | Admitting: Nurse Practitioner

## 2021-06-04 ENCOUNTER — Other Ambulatory Visit: Payer: Self-pay

## 2021-06-04 VITALS — BP 112/78 | HR 89 | Ht 67.0 in | Wt 248.0 lb

## 2021-06-04 DIAGNOSIS — G43009 Migraine without aura, not intractable, without status migrainosus: Secondary | ICD-10-CM

## 2021-06-04 DIAGNOSIS — H9311 Tinnitus, right ear: Secondary | ICD-10-CM | POA: Diagnosis not present

## 2021-06-04 MED ORDER — NURTEC 75 MG PO TBDP: 75.0000 mg | ORAL_TABLET | ORAL | 1 refills | Status: DC
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
## 2021-06-04 NOTE — Assessment & Plan Note (Signed)
Acute on chronic.  No red flags in history or on examination today.  Given little benefit with Imitrex, start Nurtec.  Samples given in office today.  Follow up if symptoms persist or worsen.

## 2021-07-01 ENCOUNTER — Emergency Department (HOSPITAL_BASED_OUTPATIENT_CLINIC_OR_DEPARTMENT_OTHER): Payer: BC Managed Care – PPO

## 2021-07-01 ENCOUNTER — Telehealth: Payer: Self-pay

## 2021-07-01 ENCOUNTER — Emergency Department (HOSPITAL_BASED_OUTPATIENT_CLINIC_OR_DEPARTMENT_OTHER): Payer: BC Managed Care – PPO | Admitting: Radiology

## 2021-07-01 ENCOUNTER — Other Ambulatory Visit: Payer: Self-pay

## 2021-07-01 ENCOUNTER — Encounter (HOSPITAL_BASED_OUTPATIENT_CLINIC_OR_DEPARTMENT_OTHER): Payer: Self-pay | Admitting: Emergency Medicine

## 2021-07-01 ENCOUNTER — Emergency Department (HOSPITAL_BASED_OUTPATIENT_CLINIC_OR_DEPARTMENT_OTHER)
Admission: EM | Admit: 2021-07-01 | Discharge: 2021-07-01 | Disposition: A | Discharge status: Home / Self Care | Payer: BC Managed Care – PPO | Attending: Emergency Medicine | Admitting: Emergency Medicine

## 2021-07-01 DIAGNOSIS — Z91040 Latex allergy status: Secondary | ICD-10-CM | POA: Diagnosis not present

## 2021-07-01 DIAGNOSIS — Z8616 Personal history of COVID-19: Secondary | ICD-10-CM | POA: Diagnosis not present

## 2021-07-01 DIAGNOSIS — G43909 Migraine, unspecified, not intractable, without status migrainosus: Secondary | ICD-10-CM | POA: Diagnosis not present

## 2021-07-01 DIAGNOSIS — R079 Chest pain, unspecified: Secondary | ICD-10-CM | POA: Diagnosis present

## 2021-07-01 DIAGNOSIS — R200 Anesthesia of skin: Secondary | ICD-10-CM | POA: Diagnosis not present

## 2021-07-01 DIAGNOSIS — R0789 Other chest pain: Secondary | ICD-10-CM | POA: Diagnosis not present

## 2021-07-01 DIAGNOSIS — R1013 Epigastric pain: Secondary | ICD-10-CM | POA: Diagnosis not present

## 2021-07-01 LAB — URINALYSIS, ROUTINE W REFLEX MICROSCOPIC
Bilirubin Urine: NEGATIVE
Glucose, UA: NEGATIVE mg/dL
Hgb urine dipstick: NEGATIVE
Ketones, ur: NEGATIVE mg/dL
Nitrite: NEGATIVE
Protein, ur: NEGATIVE mg/dL
Specific Gravity, Urine: 1.018 (ref 1.005–1.030)
pH: 6.5 (ref 5.0–8.0)

## 2021-07-01 LAB — BASIC METABOLIC PANEL
Anion gap: 10 (ref 5–15)
BUN: 12 mg/dL (ref 6–20)
CO2: 28 mmol/L (ref 22–32)
Calcium: 9.5 mg/dL (ref 8.9–10.3)
Chloride: 103 mmol/L (ref 98–111)
Creatinine, Ser: 0.56 mg/dL (ref 0.44–1.00)
GFR, Estimated: >60 mL/min (ref >60)
Glucose, Bld: 90 mg/dL (ref 70–99)
Potassium: 3.8 mmol/L (ref 3.5–5.1)
Sodium: 141 mmol/L (ref 135–145)

## 2021-07-01 LAB — CBC
HCT: 35 % — ABNORMAL LOW (ref 36.0–46.0)
Hemoglobin: 11.3 g/dL — ABNORMAL LOW (ref 12.0–15.0)
MCH: 30 pg (ref 26.0–34.0)
MCHC: 32.3 g/dL (ref 30.0–36.0)
MCV: 92.8 fL (ref 80.0–100.0)
Platelets: 337 10*3/uL (ref 150–400)
RBC: 3.77 MIL/uL — ABNORMAL LOW (ref 3.87–5.11)
RDW: 12 % (ref 11.5–15.5)
WBC: 9 10*3/uL (ref 4.0–10.5)
nRBC: 0 % (ref 0.0–0.2)

## 2021-07-01 LAB — TROPONIN I (HIGH SENSITIVITY)
Troponin I (High Sensitivity): <2 ng/L (ref <18)
Troponin I (High Sensitivity): <2 ng/L (ref <18)

## 2021-07-01 LAB — HEPATIC FUNCTION PANEL
ALT: 13 U/L (ref 0–44)
AST: 13 U/L — ABNORMAL LOW (ref 15–41)
Albumin: 4.3 g/dL (ref 3.5–5.0)
Alkaline Phosphatase: 50 U/L (ref 38–126)
Bilirubin, Direct: 0.1 mg/dL (ref 0.0–0.2)
Indirect Bilirubin: 0.3 mg/dL (ref 0.3–0.9)
Total Bilirubin: 0.4 mg/dL (ref 0.3–1.2)
Total Protein: 7.7 g/dL (ref 6.5–8.1)

## 2021-07-01 LAB — LIPASE, BLOOD: Lipase: 22 U/L (ref 11–51)

## 2021-07-01 LAB — HCG, SERUM, QUALITATIVE: Preg, Serum: NEGATIVE

## 2021-07-01 MED ORDER — DIPHENHYDRAMINE HCL 50 MG/ML IJ SOLN
12.5000 mg | Freq: Once | INTRAMUSCULAR | Status: AC
  Administered 2021-07-01: 12.5 mg via INTRAVENOUS
  Filled 2021-07-01: qty 1

## 2021-07-01 MED ORDER — KETOROLAC TROMETHAMINE 15 MG/ML IJ SOLN
15.0000 mg | Freq: Once | INTRAMUSCULAR | Status: AC
  Administered 2021-07-01: 15 mg via INTRAVENOUS
  Filled 2021-07-01: qty 1

## 2021-07-01 MED ORDER — METOCLOPRAMIDE HCL 5 MG/ML IJ SOLN
10.0000 mg | Freq: Once | INTRAMUSCULAR | Status: AC
  Administered 2021-07-01: 10 mg via INTRAVENOUS
  Filled 2021-07-01: qty 2

## 2021-07-01 MED ORDER — SODIUM CHLORIDE 0.9 % IV BOLUS
1000.0000 mL | Freq: Once | INTRAVENOUS | Status: AC
  Administered 2021-07-01: 1000 mL via INTRAVENOUS

## 2021-07-01 NOTE — Telephone Encounter (Signed)
Patient called complaining of syncope like events associated with chest pain and shortness of breath.  Advised patient to seek medical attention.  Patient agreeable.

## 2021-07-01 NOTE — ED Provider Notes (Signed)
[facility] EMERGENCY DEPT Provider Note   CSN: [id number] Arrival date & time: 07/01/21  1028     History  Chief Complaint  Patient presents with   Headache   Chest Pain    Yvette Conley is a 36 y.o. female.  Past medical history includes obesity, chronic headaches and migraines, iron deficiency anemia, fibromyalgia.  Patient presents today with multiple complaints.  She complains of chest pain that have been intermittent ever since she got over [place] in January 14, 2021.  She states that she has them about once a week where she will be doing random activities and have sudden onset epigastric pain that radiates to the left side of her chest.  These episodes last about 30 seconds.  They are not associated with any other symptoms.  It typically resolve on their own.  She does not have any other residual symptoms following this until she has her next episode.  She had an episode this morning, leading her primary care provider to send her to the emergency department today.  Patient also complains about more frequent headaches.  She says she has had migraines for a long time, however the features have changed over the past couple of months.  She is having left-sided headaches that are causing her some blurry vision, left facial numbness, dizziness, and left ear pain.  These headaches are occurring more more frequently and are now occurring every day.  Typically are only lasting about 30 minutes to an hour and tend to resolve on their own.  She had a headache today and this was another concern that her primary care provider wanted to send her to the emergency department to have further evaluation. Today she only has the headache and not the other associated symptoms. He states she has seen a neurologist in the past, however has not been evaluated in a long time.  She is taking Nurtec for her symptoms every other day, however does not feel like this is working anymore.  She denies gait  imbalance, numbness or weakness of extremities, speech difficulties.   Headache Associated symptoms: dizziness, ear pain and numbness   Chest Pain Associated symptoms: dizziness, headache and numbness       Home Medications Prior to Admission medications   Medication Sig Start Date End Date Taking? Authorizing Provider  cyclobenzaprine (FLEXERIL) 10 MG tablet TAKE 1 TABLET(10 MG) BY MOUTH THREE TIMES DAILY AS NEEDED FOR MUSCLE SPASMS 10/07/20  Yes Noemi Chapel A, NP  diphenhydrAMINE HCl (BENADRYL ALLERGY PO) Take by mouth.   Yes [provider]  MELATONIN PO Take by mouth as needed.   Yes [provider]  nabumetone (RELAFEN) 500 MG tablet TAKE 1 TABLET(500 MG) BY MOUTH TWICE DAILY AS NEEDED 10/12/19  Yes Rio Dell, Modena Nunnery, MD  Rimegepant Sulfate (NURTEC) 75 MG TBDP Take 75 mg by mouth every other day. 06/04/21  Yes Eulogio Bear, NP  promethazine (PHENERGAN) 12.5 MG tablet Take 1 tablet (12.5 mg total) by mouth every 8 (eight) hours as needed for nausea or vomiting. Patient not taking: Reported on 03/05/2020 12/14/19 03/07/20  Threasa Heads, PA-C      Allergies    Latex, Sulfa antibiotics, and Augmentin [amoxicillin-pot clavulanate]    Review of Systems   Review of Systems  HENT:  Positive for ear pain.   Cardiovascular:  Positive for chest pain.  Neurological:  Positive for dizziness, numbness and headaches.   Physical Exam Updated Vital Signs BP 113/77 (BP Location: Left  Arm)    Pulse 70    Temp 98.3 F (36.8 C) (Oral)    Resp 17    Ht 5\' 7"  (1.702 m)    Wt 113.4 kg    LMP 06/02/2021 (Approximate)    SpO2 100%    BMI 39.16 kg/m  Physical Exam Vitals and nursing note reviewed.  Constitutional:      General: She is not in acute distress.    Appearance: Normal appearance. She is not ill-appearing, toxic-appearing or diaphoretic.  HENT:     Head: Normocephalic and atraumatic.     Right Ear: Tympanic membrane, ear canal and external ear normal. There  is no impacted cerumen. Tympanic membrane is not perforated or erythematous.     Left Ear: Tympanic membrane, ear canal and external ear normal. There is no impacted cerumen. Tympanic membrane is not perforated or erythematous.     Nose: Nose normal. No nasal deformity.     Mouth/Throat:     Lips: Pink. No lesions.     Mouth: Mucous membranes are moist. No injury, lacerations, oral lesions or angioedema.     Tongue: Tongue does not deviate from midline.     Pharynx: Oropharynx is clear. Uvula midline. No pharyngeal swelling, oropharyngeal exudate, posterior oropharyngeal erythema or uvula swelling.     Tonsils: No tonsillar exudate or tonsillar abscesses.  Eyes:     General: Gaze aligned appropriately. No scleral icterus.       Right eye: No discharge.        Left eye: No discharge.     Extraocular Movements: Extraocular movements intact.     Right eye: Normal extraocular motion and no nystagmus.     Left eye: Normal extraocular motion and no nystagmus.     Conjunctiva/sclera: Conjunctivae normal.     Right eye: Right conjunctiva is not injected. No exudate or hemorrhage.    Left eye: Left conjunctiva is not injected. No exudate or hemorrhage.    Pupils: Pupils are equal, round, and reactive to light. Pupils are equal.     Comments: No notable field deficit on exam.  Cardiovascular:     Rate and Rhythm: Normal rate and regular rhythm.     Pulses: Normal pulses.          Radial pulses are 2+ on the right side and 2+ on the left side.       Dorsalis pedis pulses are 2+ on the right side and 2+ on the left side.     Heart sounds: Normal heart sounds, S1 normal and S2 normal. Heart sounds not distant. No murmur heard.   No friction rub. No gallop. No S3 or S4 sounds.  Pulmonary:     Effort: Pulmonary effort is normal. No accessory muscle usage or respiratory distress.     Breath sounds: Normal breath sounds. No stridor. No wheezing, rhonchi or rales.  Chest:     Chest wall: No tenderness.   Abdominal:     General: Abdomen is flat. Bowel sounds are normal. There is no distension.     Palpations: Abdomen is soft. There is no mass or pulsatile mass.     Tenderness: There is no abdominal tenderness. There is no guarding or rebound.  Musculoskeletal:     Cervical back: Normal range of motion and neck supple. No tenderness.     Right lower leg: No edema.     Left lower leg: No edema.  Skin:    General: Skin is warm and dry.  Coloration: Skin is not jaundiced or pale.     Findings: No bruising, erythema, lesion or rash.  Neurological:     General: No focal deficit present.     Mental Status: She is alert and oriented to person, place, and time.     GCS: GCS eye subscore is 4. GCS verbal subscore is 5. GCS motor subscore is 6.     Comments: Alert and Oriented x 3 Speech clear with no aphasia Cranial Nerve testing - Visual Fields grossly intact - PERRLA. EOM intact. No Nystagmus - Facial Sensation grossly intact - No facial asymmetry - Uvula and Tongue Midline - Accessory Muscles intact Motor: - 5/5 motor strength in all four extremities.  - No pronator Drift - Normal tone Sensation: - Grossly intact in all four extremities.  Coordination:  - Gait without abnormality.   Psychiatric:        Mood and Affect: Mood normal.        Behavior: Behavior normal. Behavior is cooperative.    ED Results / Procedures / Treatments   Labs (all labs ordered are listed, but only abnormal results are displayed) Labs Reviewed  CBC - Abnormal; Notable for the following components:      Result Value   RBC 3.77 (*)    Hemoglobin 11.3 (*)    HCT 35.0 (*)    All other components within normal limits  HEPATIC FUNCTION PANEL - Abnormal; Notable for the following components:   AST 13 (*)    All other components within normal limits  URINALYSIS, ROUTINE W REFLEX MICROSCOPIC - Abnormal; Notable for the following components:   Leukocytes,Ua SMALL (*)    All other components within  normal limits  BASIC METABOLIC PANEL  HCG, SERUM, QUALITATIVE  LIPASE, BLOOD  TROPONIN I (HIGH SENSITIVITY)  TROPONIN I (HIGH SENSITIVITY)    EKG EKG Interpretation  Date/Time:  Tuesday July 01 2021 10:36:56 EST Ventricular Rate:  87 PR Interval:  160 QRS Duration: 86 QT Interval:  370 QTC Calculation: 445 R Axis:   59 Text Interpretation: Normal sinus rhythm Normal ECG When compared with ECG of 02-Dec-2017 15:47, PREVIOUS ECG IS PRESENT since last tracing no significant change Confirmed by Rolan Bucco [phone]) on 07/01/2021 10:52:26 AM  Radiology DG Chest 2 View  Result Date: 07/01/2021 CLINICAL DATA:  Chest pain EXAM: CHEST - 2 VIEW COMPARISON:  Chest x-ray 10/11/2014 FINDINGS: Heart size and mediastinal contours are within normal limits. No suspicious pulmonary opacities identified. No pleural effusion or pneumothorax visualized. No acute osseous abnormality appreciated. IMPRESSION: No acute intrathoracic process identified. Electronically Signed   By: Jannifer Hick M.D.   On: 07/01/2021 11:05   CT Head Wo Contrast  Result Date: 07/01/2021 CLINICAL DATA:  Intermittent headaches, left eccentric, over the last several months, lasting about 15 seconds and reportedly occurring several times daily. Intermittent chest pain. EXAM: CT HEAD WITHOUT CONTRAST TECHNIQUE: Contiguous axial images were obtained from the base of the skull through the vertex without intravenous contrast. RADIATION DOSE REDUCTION: This exam was performed according to the departmental dose-optimization program which includes automated exposure control, adjustment of the mA and/or kV according to patient size and/or use of iterative reconstruction technique. COMPARISON:  MRI brain from 12/02/2017 FINDINGS: Brain: The brainstem, cerebellum, cerebral peduncles, thalami, basal ganglia, basilar cisterns, and ventricular system appear within normal limits. No intracranial hemorrhage, mass lesion, or acute CVA. Vascular:  Unremarkable Skull: Unremarkable Sinuses/Orbits: Unremarkable Other: No supplemental non-categorized findings. IMPRESSION: 1.  No significant abnormality identified. Electronically  Signed   By: Van Clines M.D.   On: 07/01/2021 13:35    Procedures Procedures  Patient on cardiac monitoring while in ED  Medications Ordered in ED Medications  ketorolac (TORADOL) 15 MG/ML injection 15 mg (15 mg Intravenous Given 07/01/21 1303)  metoCLOPramide (REGLAN) injection 10 mg (10 mg Intravenous Given 07/01/21 1304)  diphenhydrAMINE (BENADRYL) injection 12.5 mg (12.5 mg Intravenous Given 07/01/21 1303)  sodium chloride 0.9 % bolus 1,000 mL (1,000 mLs Intravenous New Bag/Given 07/01/21 1302)    ED Course/ Medical Decision Making/ A&P                           Medical Decision Making Problems Addressed: Atypical chest pain: chronic illness or injury Migraine without status migrainosus, not intractable, unspecified migraine type: acute illness or injury  Amount and/or Complexity of Data Reviewed External Data Reviewed: labs. Labs: ordered. Decision-making details documented in ED Course. Radiology: ordered and independent interpretation performed. Decision-making details documented in ED Course. ECG/medicine tests: ordered and independent interpretation performed. Decision-making details documented in ED Course.  Risk Prescription drug management.   This is a 36 y.o. female with a PMH of obesity, chronic headaches and migraines, iron deficiency anemia, fibromyalgia who presents to the ED with multiple complaints including headache and chest pain.   Vitals:  stable. Exam reassuring with normal neuro exam.  Plan to obtain repeat CT image of head given worsening headache features increasing frequency. Headache cocktail initiated.   I personally reviewed all laboratory work and imaging. Abnormal results outlined below.  CBC with stable anemia (hgb 11.3), BMP reassuring, Hepatic function panel  reassuring, lipase negative, troponins negative x 2, pregnancy negative. CT head without abnormalities. CXR without acute abnormalities. EKG with NSR.  Labs and imaging are overall reassuring.  On reassessment, patient's headache has improved after headache cocktail.  Cardiac work-up was negative.  I feel she is stable for discharge home at this time.  She needs to follow-up with her primary care provider, and I have given her neurology referral. Return precautions provided.  Portions of this note were generated with [employer]. Dictation errors may occur despite best attempts at proofreading.  Final Clinical Impression(s) / ED Diagnoses Final diagnoses:  Migraine without status migrainosus, not intractable, unspecified migraine type  Atypical chest pain    Rx / DC Orders ED Discharge Orders     None         Adolphus Birchwood, PA-C 07/01/21 2203    Malvin Johns, MD 07/05/21 1502

## 2021-07-01 NOTE — ED Triage Notes (Signed)
Pt arrives to ED with c/o headache & chest pain. This started 3-4 months ago. Pt reports she intermittently has left sided pain to head and eye. This last 15 seconds and occurs 3-4 times daily. Pt reports she feels fatigued after the pain and it tends to happen when she is talking or moving. Eye pain described as sharp. Pt reports she is also experiencing CP that last 15 seconds that occurs only once a week. The CP and eye pain/headache does not occur together. CP does radiate to left arm. Pain is described as cramp-like. Associated symptoms include nausea, dizziness, lightheadedness.

## 2021-07-01 NOTE — Discharge Instructions (Signed)
You have been seen in the ED for a headache and chest pain. Your labs and imaging were normal. You should follow up with your PCP tomorrow. I think you would benefit by seeing your neurologist again. I have provided you a referral, but if you remember what your neurologist's name is, feel free to follow up with them.

## 2021-07-02 ENCOUNTER — Ambulatory Visit (INDEPENDENT_AMBULATORY_CARE_PROVIDER_SITE_OTHER): Payer: BC Managed Care – PPO | Admitting: Nurse Practitioner

## 2021-07-02 ENCOUNTER — Encounter: Payer: Self-pay | Admitting: Nurse Practitioner

## 2021-07-02 VITALS — BP 110/70 | HR 92 | Ht 67.0 in | Wt 255.0 lb

## 2021-07-02 DIAGNOSIS — G43009 Migraine without aura, not intractable, without status migrainosus: Secondary | ICD-10-CM | POA: Diagnosis not present

## 2021-07-02 DIAGNOSIS — N898 Other specified noninflammatory disorders of vagina: Secondary | ICD-10-CM | POA: Diagnosis not present

## 2021-07-02 DIAGNOSIS — Z113 Encounter for screening for infections with a predominantly sexual mode of transmission: Secondary | ICD-10-CM

## 2021-07-02 LAB — URINALYSIS, ROUTINE W REFLEX MICROSCOPIC
Bacteria, UA: NONE SEEN /HPF
Bilirubin Urine: NEGATIVE
Glucose, UA: NEGATIVE
Hgb urine dipstick: NEGATIVE
Hyaline Cast: NONE SEEN /LPF
Ketones, ur: NEGATIVE
Nitrite: NEGATIVE
Protein, ur: NEGATIVE
RBC / HPF: NONE SEEN /HPF (ref 0–2)
Specific Gravity, Urine: 1.025 (ref 1.001–1.035)
pH: 7 (ref 5.0–8.0)

## 2021-07-02 LAB — WET PREP FOR TRICH, YEAST, CLUE

## 2021-07-02 LAB — MICROSCOPIC MESSAGE
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
## 2021-07-03 LAB — C. TRACHOMATIS/N. GONORRHOEAE RNA
C. trachomatis RNA, TMA: NOT DETECTED
N. gonorrhoeae RNA, TMA: NOT DETECTED

## 2021-07-03 LAB — RPR: RPR Ser Ql: NONREACTIVE

## 2021-07-03 LAB — HIV ANTIBODY (ROUTINE TESTING W REFLEX): HIV 1&2 Ab, 4th Generation: NONREACTIVE

## 2021-07-03 NOTE — Assessment & Plan Note (Addendum)
Chronic.  Given worsening and minimal relief with Nurtec, obtain MRI imaging of brain.  Headache is much improved today with headache cocktail in ER.  Referral placed to neurology for ongoing evaluation and monitoring.

## 2021-07-25 ENCOUNTER — Other Ambulatory Visit: Payer: Self-pay | Admitting: Nurse Practitioner

## 2021-07-29 ENCOUNTER — Other Ambulatory Visit: Payer: Self-pay | Admitting: Family Medicine

## 2021-08-26 ENCOUNTER — Ambulatory Visit: Payer: BC Managed Care – PPO | Admitting: Diagnostic Neuroimaging

## 2021-08-26 ENCOUNTER — Encounter: Payer: Self-pay | Admitting: Diagnostic Neuroimaging

## 2021-08-26 VITALS — BP 119/81 | HR 100 | Ht 67.0 in | Wt 253.0 lb

## 2021-08-26 DIAGNOSIS — G43109 Migraine with aura, not intractable, without status migrainosus: Secondary | ICD-10-CM

## 2021-08-26 MED ORDER — TOPIRAMATE 50 MG PO TABS: 50.0000 mg | ORAL_TABLET | Freq: Two times a day (BID) | ORAL | 12 refills | Status: DC

## 2021-08-26 MED ORDER — RIZATRIPTAN BENZOATE 10 MG PO TBDP: 10.0000 mg | ORAL_TABLET | ORAL | 11 refills | Status: DC | PRN

## 2021-08-26 NOTE — Patient Instructions (Addendum)
MIGRAINE PREVENTION  LIFESTYLE CHANGES -Stop or avoid smoking -Decrease or avoid caffeine / alcohol -Eat and sleep on a regular schedule -Exercise several times per week - start topiramate 50mg at bedtime; after 1-2 weeks increase to 50mg twice a day; drink plenty of water   MIGRAINE RESCUE  - ibuprofen, tylenol as needed - rizatriptan (Maxalt) 10mg as needed for breakthrough headache; may repeat x 1 after 2 hours; max 2 tabs per day or 8 per month 
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
## 2021-09-02 ENCOUNTER — Encounter: Payer: Self-pay | Admitting: Diagnostic Neuroimaging

## 2021-10-16 ENCOUNTER — Ambulatory Visit: Payer: BC Managed Care – PPO | Admitting: Family Medicine

## 2021-10-16 ENCOUNTER — Encounter: Payer: Self-pay | Admitting: Family Medicine

## 2021-10-16 VITALS — BP 118/83 | HR 86 | Resp 16 | Ht 67.0 in | Wt 256.0 lb

## 2021-10-16 DIAGNOSIS — E559 Vitamin D deficiency, unspecified: Secondary | ICD-10-CM

## 2021-10-16 DIAGNOSIS — Z139 Encounter for screening, unspecified: Secondary | ICD-10-CM

## 2021-10-16 DIAGNOSIS — D509 Iron deficiency anemia, unspecified: Secondary | ICD-10-CM | POA: Diagnosis not present

## 2021-10-16 DIAGNOSIS — G43109 Migraine with aura, not intractable, without status migrainosus: Secondary | ICD-10-CM

## 2021-10-16 DIAGNOSIS — E119 Type 2 diabetes mellitus without complications: Secondary | ICD-10-CM

## 2021-10-16 DIAGNOSIS — M62830 Muscle spasm of back: Secondary | ICD-10-CM

## 2021-10-16 MED ORDER — CYCLOBENZAPRINE HCL 5 MG PO TABS: 5.0000 mg | ORAL_TABLET | Freq: Three times a day (TID) | ORAL | 1 refills | Status: DC | PRN

## 2021-10-16 NOTE — Patient Instructions (Addendum)
F/U in 3 months, re evaluate weight and chronic problems , call if you need me sooner ? ?Please get fasting cBC, lipid, cmp and EGFr, TSH, HBA1C and vit D and hep C screen , iron and ferritin in n ext 1 week ? ?Will prescribe phentermine half tab daily once I review your labs ? ?Flexeril is prescribed for as needed use for spasm ? ?Please commit to  30 minutes 5 days / week of exercise ? 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 ?Plan to eat over a 12 hour period eg star 6: 30 and end at 7 pm ? ?Work at 64 oz water daily ? ?80% food, should be vegetable , and fruit, fresh or frozen, eat with protein, meat, fish, beans, eggs ? ?Try to stop sugar y foods, get sweet taste from fruit ? ?Please call Neurology and get return appointment with them ? ?Thanks for choosing [facility], we consider it a privelige to serve you. ? ?

## 2021-10-16 NOTE — Assessment & Plan Note (Signed)
Twice monthly on averag e2 to 3 days, refill flexeril ?

## 2021-10-20 ENCOUNTER — Encounter: Payer: Self-pay | Admitting: Family Medicine

## 2021-10-20 DIAGNOSIS — E669 Obesity, unspecified: Secondary | ICD-10-CM | POA: Insufficient documentation

## 2021-10-20 NOTE — Assessment & Plan Note (Signed)
?  Patient re-educated about  the importance of commitment to a  minimum of 150 minutes of exercise per week as able. ? ?The importance of healthy food choices with portion control discussed, as well as eating regularly and within a 12 hour window most days. ?The need to choose "clean , green" food 50 to 75% of the time is discussed, as well as to make water the primary drink and set a goal of 64 ounces water daily. ? ?  ? ?  10/16/2021  ?  1:33 PM 08/26/2021  ? 10:04 AM 07/02/2021  ?  3:03 PM  ?Weight /BMI  ?Weight 256 lb 253 lb 255 lb  ?Height 5\' 7"  (1.702 m) 5\' 7"  (1.702 m) 5\' 7"  (1.702 m)  ?BMI 40.1 kg/m2 39.63 kg/m2 39.94 kg/m2  ? ? ? ?

## 2021-10-20 NOTE — Assessment & Plan Note (Signed)
Updated lab needed at/ before next visit.   

## 2021-10-20 NOTE — Progress Notes (Signed)
? ?Yvette Conley     MRN: [id number]      DOB: 06/23/1985 ? ? ?HPI ?Ms. Skelley is here as a new patient for  follow up  and re-evaluation of chronic medical conditions, medication management and review of any available recent lab and radiology data.  ?Preventive health is updated, specifically  Cancer screening and Immunization.   ? ?The PT denies any adverse reactions to current medications since the last visit.  ?There are no new concerns.  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 ?ROS ?Denies recent fever or chills. ?Denies sinus pressure, nasal congestion, ear pain or sore throat. ?Denies chest congestion, productive cough or wheezing. ?Denies chest pains, palpitations and leg swelling ?Denies abdominal pain, nausea, vomiting,diarrhea or constipation.   ?Denies dysuria, frequency, hesitancy or incontinence. ?Denies joint pain, swelling and limitation in mobility. ?. ?Denies depression, anxiety or insomnia. ?Denies skin break down or rash. ? ? ?PE ? ?BP 118/83   Pulse 86   Resp 16   Ht 5\' 7"  (1.702 m)   Wt 256 lb (116.1 kg)   SpO2 97%   BMI 40.10 kg/m?  ? ?Patient alert and oriented and in no cardiopulmonary distress. ? ?HEENT: No facial asymmetry, EOMI,     Neck supple . ? ?Chest: Clear to auscultation bilaterally. ? ?CVS: S1, S2 no murmurs, no S3.Regular rate. ? ?ABD: Soft non tender.  ? ?Ext: No edema ? ?MS: Adequate ROM spine, shoulders, hips and knees. ? ?Skin: Intact, no ulcerations or rash noted. ? ?Psych: Good eye contact, normal affect.  Memory intact not anxious or depressed appearing. ? ?CNS: CN 2-12 intact, power,  normal throughout.no focal deficits noted. ? ? ?Assessment & Plan ? ?Back spasm ?Twice monthly on averag e2 to 3 days, refill flexeril ? ?Iron deficiency anemia ?Updated lab needed at/ before next visit. ? ? ?Obesity ? ?Patient re-educated about  the importance of commitment to a  minimum of 150 minutes of exercise per week as able. ? ?The importance of healthy food choices with portion control discussed, as well as eating regularly and within a 12 hour window most days. ?The need to choose "clean , green" food 50 to 75% of the time is discussed, as well as to make water the primary drink and set a goal of 64 ounces water daily. ? ?  ? ?  10/16/2021  ?  1:33 PM 08/26/2021  ? 10:04 AM 07/02/2021  ?  3:03 PM  ?Weight /BMI  ?Weight 256 lb 253 lb 255 lb  ?Height 5\' 7"  (1.702 m) 5\' 7"  (1.702 m) 5\' 7"  (1.702 m)  ?BMI 40.1 kg/m2 39.63 kg/m2 39.94 kg/m2  ? ? ? ? ?Morbid obesity (HCC) ? ?Patient re-educated about  the importance of commitment to a  minimum of 150 minutes of exercise per week as able. ? ?The importance of healthy food choices with portion control discussed, as well as eating regularly and within a 12 hour window most days. ?The need to choose "clean ,  green" food 50 to 75% of the time is discussed, as well as to make water the primary drink and set a goal of 64 ounces water daily. ? ?  ? ?  10/16/2021  ?  1:33 PM 08/26/2021  ? 10:04 AM 07/02/2021  ?  3:03 PM  ?Weight /BMI  ?Weight 256 lb 253 lb 255 lb  ?Height 5\' 7"  (1.702 m) 5\' 7"  (1.702 m) 5\' 7"  (1.702 m)  ?BMI 40.1 kg/m2 39.63 kg/m2 39.94 kg/m2  ? ? ? ? ?

## 2021-10-20 NOTE — Assessment & Plan Note (Signed)
Managed by neurology

## 2021-10-20 NOTE — Assessment & Plan Note (Signed)
?  Patient re-educated about  the importance of commitment to a  minimum of 150 minutes of exercise per week as able. ? ?The importance of healthy food choices with portion control discussed, as well as eating regularly and within a 12 hour window most days. ?The need to choose "clean , green" food 50 to 75% of the time is discussed, as well as to make water the primary drink and set a goal of 64 ounces water daily. ? ?  ? ?  10/16/2021  ?  1:33 PM 08/26/2021  ? 10:04 AM 07/02/2021  ?  3:03 PM  ?Weight /BMI  ?Weight 256 lb 253 lb 255 lb  ?Height 5' 7" (1.702 m) 5' 7" (1.702 m) 5' 7" (1.702 m)  ?BMI 40.1 kg/m2 39.63 kg/m2 39.94 kg/m2  ? ? ? ?

## 2021-10-22 LAB — FERRITIN: Ferritin: 107 ng/mL (ref 15–150)

## 2021-10-22 LAB — CMP14+EGFR
ALT: 25 IU/L (ref 0–32)
AST: 22 IU/L (ref 0–40)
Albumin/Globulin Ratio: 1.5 (ref 1.2–2.2)
Albumin: 4.4 g/dL (ref 3.8–4.8)
Alkaline Phosphatase: 64 IU/L (ref 44–121)
BUN/Creatinine Ratio: 13 (ref 9–23)
BUN: 9 mg/dL (ref 6–20)
Bilirubin Total: 0.4 mg/dL (ref 0.0–1.2)
CO2: 23 mmol/L (ref 20–29)
Calcium: 9.5 mg/dL (ref 8.7–10.2)
Chloride: 103 mmol/L (ref 96–106)
Creatinine, Ser: 0.69 mg/dL (ref 0.57–1.00)
Globulin, Total: 2.9 g/dL (ref 1.5–4.5)
Glucose: 95 mg/dL (ref 70–99)
Potassium: 4.5 mmol/L (ref 3.5–5.2)
Sodium: 141 mmol/L (ref 134–144)
Total Protein: 7.3 g/dL (ref 6.0–8.5)
eGFR: 117 mL/min/{1.73_m2} (ref >59)

## 2021-10-22 LAB — LIPID PANEL
Chol/HDL Ratio: 2.6 ratio (ref 0.0–4.4)
Cholesterol, Total: 148 mg/dL (ref 100–199)
HDL: 58 mg/dL (ref >39)
LDL Chol Calc (NIH): 73 mg/dL (ref 0–99)
Triglycerides: 92 mg/dL (ref 0–149)
VLDL Cholesterol Cal: 17 mg/dL (ref 5–40)

## 2021-10-22 LAB — CBC
Hematocrit: 35.3 % (ref 34.0–46.6)
Hemoglobin: 12 g/dL (ref 11.1–15.9)
MCH: 31.2 pg (ref 26.6–33.0)
MCHC: 34 g/dL (ref 31.5–35.7)
MCV: 92 fL (ref 79–97)
Platelets: 364 10*3/uL (ref 150–450)
RBC: 3.85 x10E6/uL (ref 3.77–5.28)
RDW: 12.3 % (ref 11.7–15.4)
WBC: 8 10*3/uL (ref 3.4–10.8)

## 2021-10-22 LAB — HEMOGLOBIN A1C
Est. average glucose Bld gHb Est-mCnc: 120 mg/dL
Hgb A1c MFr Bld: 5.8 % — ABNORMAL HIGH (ref 4.8–5.6)

## 2021-10-22 LAB — IRON: Iron: 79 ug/dL (ref 27–159)

## 2021-10-22 LAB — HEPATITIS C ANTIBODY: Hep C Virus Ab: NONREACTIVE

## 2021-10-22 LAB — TSH: TSH: 1.08 u[IU]/mL (ref 0.450–4.500)

## 2021-10-22 LAB — VITAMIN D 25 HYDROXY (VIT D DEFICIENCY, FRACTURES): Vit D, 25-Hydroxy: 25.6 ng/mL — ABNORMAL LOW (ref 30.0–100.0)

## 2021-10-25 ENCOUNTER — Encounter: Payer: Self-pay | Admitting: Family Medicine

## 2021-10-25 ENCOUNTER — Encounter: Payer: Self-pay | Admitting: Diagnostic Neuroimaging

## 2021-10-27 MED ORDER — AJOVY 225 MG/1.5ML ~~LOC~~ SOAJ: 225.0000 mg | SUBCUTANEOUS | 3 refills | Status: DC

## 2021-10-27 MED ORDER — PHENTERMINE HCL 37.5 MG PO TABS: ORAL_TABLET | ORAL | 3 refills | Status: DC

## 2021-10-27 NOTE — Addendum Note (Signed)
Addended by: Kerri Perches on: 10/27/2021 08:54 AM ? ? Modules accepted: Orders ? ?

## 2021-10-27 NOTE — Telephone Encounter (Signed)
Meds ordered this encounter  ?Medications  ? Fremanezumab-vfrm (AJOVY) 225 MG/1.5ML SOAJ  ?  Sig: Inject 225 mg into the skin every 30 (thirty) days.  ?  Dispense:  1.68 mL  ?  Refill:  3  ? ? ?Suanne Marker, MD 10/27/2021, 4:27 PM ?Certified in Neurology, Neurophysiology and Neuroimaging ? ?[facility] ?[address], [address] ?[place], [place] [address] ?[phone] ? ?

## 2022-01-20 ENCOUNTER — Ambulatory Visit: Payer: BC Managed Care – PPO | Admitting: Family Medicine

## 2022-01-26 NOTE — Telephone Encounter (Signed)
error 

## 2022-02-09 ENCOUNTER — Telehealth: Payer: Self-pay | Admitting: Diagnostic Neuroimaging

## 2022-02-09 ENCOUNTER — Encounter: Payer: Self-pay | Admitting: Diagnostic Neuroimaging

## 2022-02-09 NOTE — Telephone Encounter (Signed)
VM box full, sent letter and mychart msg informing pt of need to reschedule 9/18 appointment - MD out

## 2022-03-02 ENCOUNTER — Telehealth: Payer: BC Managed Care – PPO | Admitting: Diagnostic Neuroimaging

## 2022-03-13 ENCOUNTER — Other Ambulatory Visit: Payer: Self-pay

## 2022-03-13 ENCOUNTER — Encounter: Payer: Self-pay | Admitting: Family Medicine

## 2022-03-13 MED ORDER — PHENTERMINE HCL 37.5 MG PO TABS
ORAL_TABLET | ORAL | 3 refills | Status: DC
Start: 2022-03-13 — End: 2022-03-25

## 2022-03-13 NOTE — Telephone Encounter (Signed)
Rx sent 

## 2022-03-18 ENCOUNTER — Other Ambulatory Visit: Payer: Self-pay

## 2022-03-18 ENCOUNTER — Telehealth: Payer: Self-pay | Admitting: Family Medicine

## 2022-03-18 MED ORDER — CYCLOBENZAPRINE HCL 5 MG PO TABS: 5.0000 mg | ORAL_TABLET | Freq: Three times a day (TID) | ORAL | 1 refills | Status: DC | PRN

## 2022-03-18 NOTE — Telephone Encounter (Signed)
Patient called in needs refills on  phentermine (ADIPEX-P) 37.5 MG tablet  Patient is completely out of med   Also needs refill on  cyclobenzaprine (FLEXERIL) 5 MG tablet

## 2022-03-18 NOTE — Telephone Encounter (Signed)
Appt 03/25/22 

## 2022-03-25 ENCOUNTER — Ambulatory Visit: Payer: BC Managed Care – PPO | Admitting: Family Medicine

## 2022-03-25 ENCOUNTER — Encounter: Payer: Self-pay | Admitting: Family Medicine

## 2022-03-25 VITALS — BP 109/83 | HR 87 | Ht 67.0 in | Wt 244.0 lb

## 2022-03-25 DIAGNOSIS — M79671 Pain in right foot: Secondary | ICD-10-CM | POA: Diagnosis not present

## 2022-03-25 DIAGNOSIS — J0100 Acute maxillary sinusitis, unspecified: Secondary | ICD-10-CM

## 2022-03-25 DIAGNOSIS — J309 Allergic rhinitis, unspecified: Secondary | ICD-10-CM | POA: Diagnosis not present

## 2022-03-25 DIAGNOSIS — M79672 Pain in left foot: Secondary | ICD-10-CM

## 2022-03-25 DIAGNOSIS — J320 Chronic maxillary sinusitis: Secondary | ICD-10-CM | POA: Insufficient documentation

## 2022-03-25 DIAGNOSIS — M722 Plantar fascial fibromatosis: Secondary | ICD-10-CM | POA: Insufficient documentation

## 2022-03-25 DIAGNOSIS — M62830 Muscle spasm of back: Secondary | ICD-10-CM

## 2022-03-25 MED ORDER — AZITHROMYCIN 250 MG PO TABS: ORAL_TABLET | ORAL | 0 refills | Status: AC

## 2022-03-25 MED ORDER — MONTELUKAST SODIUM 10 MG PO TABS: 10.0000 mg | ORAL_TABLET | Freq: Every day | ORAL | 3 refills | Status: DC

## 2022-03-25 MED ORDER — PHENTERMINE HCL 37.5 MG PO TABS: ORAL_TABLET | ORAL | 3 refills | Status: DC

## 2022-03-25 NOTE — Assessment & Plan Note (Signed)
5 month history, past h/o plantar fascitis, has purchased inserts , no significant improvement, refer drSteen eval and management

## 2022-03-25 NOTE — Assessment & Plan Note (Signed)
Improved, continue half phentermine daily  Patient re-educated about  the importance of commitment to a  minimum of 150 minutes of exercise per week as able.  The importance of healthy food choices with portion control discussed, as well as eating regularly and within a 12 hour window most days. The need to choose "clean , green" food 50 to 75% of the time is discussed, as well as to make water the primary drink and set a goal of 64 ounces water daily.       03/25/2022    2:34 PM 10/16/2021    1:33 PM 08/26/2021   10:04 AM  Weight /BMI  Weight 244 lb 256 lb 253 lb  Height 5\' 7"  (1.702 m) 5\' 7"  (1.702 m) 5\' 7"  (1.702 m)  BMI 38.22 kg/m2 40.1 kg/m2 39.63 kg/m2

## 2022-03-25 NOTE — Assessment & Plan Note (Signed)
Start daily singulair

## 2022-03-25 NOTE — Patient Instructions (Signed)
F/u in 4 months, call ifyou need me sooner  You are referred for foot pain, pls get appt at checkout  CONGRATS on 12 pound weight loss, keep it up@!  Z pack prescribed for maxillary sinusitis   Montelukast daily for allergies  It is important that you exercise regularly at least 30 minutes 5 times a week. If you develop chest pain, have severe difficulty breathing, or feel very tired, stop exercising immediately and seek medical attention    Thanks for choosing [facility], we consider it a privelige to serve you.

## 2022-03-25 NOTE — Assessment & Plan Note (Signed)
z pack prescribed 

## 2022-03-25 NOTE — Assessment & Plan Note (Signed)
Continue flexeril as before

## 2022-03-25 NOTE — Progress Notes (Signed)
   Yvette Conley     MRN: [id number]      DOB: 1985-09-28   HPI [name] is here for follow up and re-evaluation of chronic medical conditions, medication management and review of any available recent lab and radiology data.  Preventive health is updated, specifically  Cancer screening and Immunization.   C/o bilateral foor pain x 6 months, has been dx with plantar fascitis in the past, unable to exercise because of pain, wants help 1 week h/o pressure over right cheek with right ear pressure, nocturnal cough and green / brown nasal drainage Still waiting on appt with Neurology, doing well with current med and needs no refills Good response with no adverse s/e to phentermine  ROS  Denies chest congestion, productive cough or wheezing. Denies chest pains, palpitations and leg swelling Denies abdominal pain, nausea, vomiting,diarrhea or constipation.   Denies dysuria, frequency, hesitancy or incontinence.  Denies headaches, seizures, numbness, or tingling. Denies depression, anxiety or insomnia. Denies skin break down or rash.   PE  BP 109/83 (BP Location: Right Arm, Patient Position: Sitting)   Pulse 87   Ht 5\' 7"  (1.702 m)   Wt 244 lb (110.7 kg)   SpO2 97%   BMI 38.22 kg/m   Patient alert and oriented and in no cardiopulmonary distress.  HEENT: No facial asymmetry, EOMI,     Neck supple .Right maxillary sinus tenderness, right tM dull,no erythema  Chest: Clear to auscultation bilaterally.  CVS: S1, S2 no murmurs, no S3.Regular rate.  ABD: Soft non tender.   Ext: No edema  MS: Adequate ROM spine, shoulders, hips and knees.  Skin: Intact, no ulcerations or rash noted.  Psych: Good eye contact, normal affect. Memory intact not anxious or depressed appearing.  CNS: CN 2-12 intact, power,  normal throughout.no focal deficits noted.   Assessment & Plan  Maxillary sinusitis z pack prescribed  Allergic sinusitis Start daily singulair  Morbid obesity  ([place]) Improved, continue half phentermine daily  Patient re-educated about  the importance of commitment to a  minimum of 150 minutes of exercise per week as able.  The importance of healthy food choices with portion control discussed, as well as eating regularly and within a 12 hour window most days. The need to choose "clean , green" food 50 to 75% of the time is discussed, as well as to make water the primary drink and set a goal of 64 ounces water daily.       03/25/2022    2:34 PM 10/16/2021    1:33 PM 08/26/2021   10:04 AM  Weight /BMI  Weight 244 lb 256 lb 253 lb  Height 5\' 7"  (1.702 m) 5\' 7"  (1.702 m) 5\' 7"  (1.702 m)  BMI 38.22 kg/m2 40.1 kg/m2 39.63 kg/m2      Foot pain, bilateral 5 month history, past h/o plantar fascitis, has purchased inserts , no significant improvement, refer drSteen eval and management  Back spasm Continue flexeril as before

## 2022-03-31 ENCOUNTER — Encounter: Payer: Self-pay | Admitting: Internal Medicine

## 2022-03-31 ENCOUNTER — Ambulatory Visit (HOSPITAL_COMMUNITY)
Admission: RE | Admit: 2022-03-31 | Discharge: 2022-03-31 | Disposition: A | Discharge status: Home / Self Care | Payer: BC Managed Care – PPO | Source: Ambulatory Visit | Attending: Internal Medicine | Admitting: Internal Medicine

## 2022-03-31 ENCOUNTER — Ambulatory Visit: Payer: BC Managed Care – PPO | Admitting: Internal Medicine

## 2022-03-31 VITALS — BP 113/80 | HR 96 | Resp 16 | Ht 67.0 in | Wt 240.0 lb

## 2022-03-31 DIAGNOSIS — M79671 Pain in right foot: Secondary | ICD-10-CM | POA: Diagnosis not present

## 2022-03-31 DIAGNOSIS — M722 Plantar fascial fibromatosis: Secondary | ICD-10-CM

## 2022-03-31 DIAGNOSIS — M79672 Pain in left foot: Secondary | ICD-10-CM | POA: Diagnosis not present

## 2022-03-31 MED ORDER — NAPROXEN 500 MG PO TABS: 500.0000 mg | ORAL_TABLET | Freq: Two times a day (BID) | ORAL | 0 refills | Status: DC

## 2022-03-31 NOTE — Patient Instructions (Addendum)
plantar fasciitis Take Naproxen for 10 days Plantar fascia stretch for 20-30 seconds (do 3 of these) in morning Lowering/raise on a step exercises 3 x 10 once or twice a day - this is very important for long term recovery. Can add heel walks, toe walks forward and backward as well Ice heel for 15 minutes as needed. Avoid flat shoes/barefoot walking as much as possible. Arch straps have been shown to help with pain. Inserts are important (dr. Zoe Lan active series, spencos, custom orthotics). Steroid injection is a consideration for short term pain relief if you are struggling. Schedule if not improving.  Physical therapy is also an option.Try these exercises and we can discuss if not improving. Follow up with me in 2 weeks.
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
## 2022-04-01 NOTE — Assessment & Plan Note (Signed)
Patient has history of plantar fascitis and presents for evaluation of bilateral foot pain. Pain is worse in right foot today and causing her to limp. Pain is located on plantar surface on the heel. She has tried orthotics from the [employer] and initially helped. She has taken some tylenol at home to help with pain and used ice.   Assessment/Plan: Sending for xray of heel on right foot to rule out fracture and spurs. If not fracture will treat as bilateral plantar fascitis. Patient will take naproxen. Complete stretches ,exercises, and ice plantar surface of heel. Continue using inserts or purchase inserts recommended. If patient is not improving she will schedule for injections to help with symptoms.Marland Kitchen

## 2022-04-08 ENCOUNTER — Other Ambulatory Visit: Payer: Self-pay | Admitting: Internal Medicine

## 2022-04-08 DIAGNOSIS — M722 Plantar fascial fibromatosis: Secondary | ICD-10-CM

## 2022-04-14 ENCOUNTER — Encounter: Payer: Self-pay | Admitting: Internal Medicine

## 2022-04-14 ENCOUNTER — Ambulatory Visit (INDEPENDENT_AMBULATORY_CARE_PROVIDER_SITE_OTHER): Payer: BC Managed Care – PPO | Admitting: Internal Medicine

## 2022-04-14 DIAGNOSIS — M722 Plantar fascial fibromatosis: Secondary | ICD-10-CM
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
## 2022-04-14 NOTE — Assessment & Plan Note (Addendum)
Here for follow up of bilateral plantar fasciitis.  Pain has improved with use of NSAID, exercises, and footwear.  Patient has no limit to her activity.  Occasionally she has had a pain in her heel. She will finish course of Naproxen. If continuing to have occasional heel pain I recommended scheduling appointment for steroid injection. No further workup at this time.

## 2022-04-14 NOTE — Patient Instructions (Signed)
Thank you for trusting me with your care. To recap, today we discussed the following:   Finish your Naproxen. If you are still having pain in your heel schedule an appointment for steroid injection.

## 2022-04-21 ENCOUNTER — Encounter: Payer: Self-pay | Admitting: Internal Medicine

## 2022-04-24 ENCOUNTER — Other Ambulatory Visit: Payer: Self-pay | Admitting: Diagnostic Neuroimaging

## 2022-04-24 ENCOUNTER — Ambulatory Visit: Payer: BC Managed Care – PPO | Admitting: Family Medicine

## 2022-04-28 ENCOUNTER — Encounter: Payer: Self-pay | Admitting: Family Medicine

## 2022-04-29 ENCOUNTER — Encounter (HOSPITAL_BASED_OUTPATIENT_CLINIC_OR_DEPARTMENT_OTHER): Payer: Self-pay

## 2022-04-29 ENCOUNTER — Emergency Department (HOSPITAL_BASED_OUTPATIENT_CLINIC_OR_DEPARTMENT_OTHER): Payer: BC Managed Care – PPO

## 2022-04-29 ENCOUNTER — Telehealth: Payer: Self-pay | Admitting: Family Medicine

## 2022-04-29 ENCOUNTER — Emergency Department (HOSPITAL_BASED_OUTPATIENT_CLINIC_OR_DEPARTMENT_OTHER)
Admission: EM | Admit: 2022-04-29 | Discharge: 2022-04-29 | Disposition: A | Discharge status: Home / Self Care | Payer: BC Managed Care – PPO | Attending: Emergency Medicine | Admitting: Emergency Medicine

## 2022-04-29 ENCOUNTER — Other Ambulatory Visit: Payer: Self-pay

## 2022-04-29 DIAGNOSIS — Z79899 Other long term (current) drug therapy: Secondary | ICD-10-CM | POA: Insufficient documentation

## 2022-04-29 DIAGNOSIS — K5730 Diverticulosis of large intestine without perforation or abscess without bleeding: Secondary | ICD-10-CM | POA: Insufficient documentation

## 2022-04-29 DIAGNOSIS — Z91040 Latex allergy status: Secondary | ICD-10-CM | POA: Insufficient documentation

## 2022-04-29 DIAGNOSIS — K625 Hemorrhage of anus and rectum: Secondary | ICD-10-CM

## 2022-04-29 DIAGNOSIS — R109 Unspecified abdominal pain: Secondary | ICD-10-CM | POA: Diagnosis present

## 2022-04-29 DIAGNOSIS — K5792 Diverticulitis of intestine, part unspecified, without perforation or abscess without bleeding: Secondary | ICD-10-CM

## 2022-04-29 LAB — CBC
HCT: 36.2 % (ref 36.0–46.0)
Hemoglobin: 11.8 g/dL — ABNORMAL LOW (ref 12.0–15.0)
MCH: 30.3 pg (ref 26.0–34.0)
MCHC: 32.6 g/dL (ref 30.0–36.0)
MCV: 93.1 fL (ref 80.0–100.0)
Platelets: 373 10*3/uL (ref 150–400)
RBC: 3.89 MIL/uL (ref 3.87–5.11)
RDW: 12.5 % (ref 11.5–15.5)
WBC: 10.2 10*3/uL (ref 4.0–10.5)
nRBC: 0 % (ref 0.0–0.2)

## 2022-04-29 LAB — COMPREHENSIVE METABOLIC PANEL
ALT: 14 U/L (ref 0–44)
AST: 11 U/L — ABNORMAL LOW (ref 15–41)
Albumin: 4.5 g/dL (ref 3.5–5.0)
Alkaline Phosphatase: 46 U/L (ref 38–126)
Anion gap: 9 (ref 5–15)
BUN: 14 mg/dL (ref 6–20)
CO2: 26 mmol/L (ref 22–32)
Calcium: 9.5 mg/dL (ref 8.9–10.3)
Chloride: 104 mmol/L (ref 98–111)
Creatinine, Ser: 0.64 mg/dL (ref 0.44–1.00)
GFR, Estimated: >60 mL/min (ref >60)
Glucose, Bld: 128 mg/dL — ABNORMAL HIGH (ref 70–99)
Potassium: 3.7 mmol/L (ref 3.5–5.1)
Sodium: 139 mmol/L (ref 135–145)
Total Bilirubin: 0.3 mg/dL (ref 0.3–1.2)
Total Protein: 7.9 g/dL (ref 6.5–8.1)

## 2022-04-29 LAB — HCG, SERUM, QUALITATIVE: Preg, Serum: NEGATIVE

## 2022-04-29 MED ORDER — IOHEXOL 300 MG/ML  SOLN
100.0000 mL | Freq: Once | INTRAMUSCULAR | Status: AC | PRN
  Administered 2022-04-29: 85 mL via INTRAVENOUS

## 2022-04-29 MED ORDER — SODIUM CHLORIDE 0.9 % IV BOLUS
1000.0000 mL | Freq: Once | INTRAVENOUS | Status: AC
  Administered 2022-04-29: 1000 mL via INTRAVENOUS

## 2022-04-29 MED ORDER — CIPROFLOXACIN HCL 500 MG PO TABS: 500.0000 mg | ORAL_TABLET | Freq: Two times a day (BID) | ORAL | 0 refills | Status: DC

## 2022-04-29 MED ORDER — HYDROCODONE-ACETAMINOPHEN 5-325 MG PO TABS: 1.0000 | ORAL_TABLET | ORAL | 0 refills | Status: DC | PRN

## 2022-04-29 MED ORDER — ONDANSETRON 4 MG PO TBDP: 4.0000 mg | ORAL_TABLET | Freq: Three times a day (TID) | ORAL | 0 refills | Status: DC | PRN

## 2022-04-29 MED ORDER — METRONIDAZOLE 500 MG PO TABS: 500.0000 mg | ORAL_TABLET | Freq: Two times a day (BID) | ORAL | 0 refills | Status: DC

## 2022-04-29 NOTE — Telephone Encounter (Signed)
Mychart msg sent to Dr Lodema Hong waiting on her response.

## 2022-04-29 NOTE — Telephone Encounter (Signed)
Patient aware.

## 2022-04-29 NOTE — Discharge Instructions (Signed)
Antibiotics as prescribed and complete the full course. Norco as needed as prescribed for pain.  Do not drive operate machinery while taking this medication. Zofran as needed as prescribed for nausea and vomiting.  Return to ER for vomiting or pain not controlled with medications, fever, worsening or concerning symptoms.  Otherwise follow-up with your primary care provider.

## 2022-04-29 NOTE — Telephone Encounter (Signed)
Pt called I regards to the mychart message she sent yesterday 11/14. Wants to know if you can please contact her when available?

## 2022-04-29 NOTE — ED Provider Notes (Signed)
[facility] EMERGENCY DEPT Provider Note   CSN: [id number] Arrival date & time: 04/29/22  1308     History  Chief Complaint  Patient presents with   Rectal Bleeding   Abdominal Pain   Back Pain    Yvette Conley is a 36 y.o. female.  36 year old female presents with concerns for abdominal pain.  Patient notes that she started with bright red blood per rectum on Saturday (4 days ago) while going to the bathroom.  Has not had any blood from the rectum since yesterday.  Has since developed generalized abdominal pain more so on the left with loose stools.  Has had chills at times with nausea.  Denies vomiting, fevers.  Also nonproductive cough.  No urinary symptoms.  Reports last colonoscopy 10 years ago, unremarkable.  No history of diverticulitis.  States that she did recently complete a course of naproxen for plantar fasciitis.       Home Medications Prior to Admission medications   Medication Sig Start Date End Date Taking? Authorizing Provider  ciprofloxacin (CIPRO) 500 MG tablet Take 1 tablet (500 mg total) by mouth every 12 (twelve) hours. 04/29/22  Yes Jeannie Fend, PA-C  HYDROcodone-acetaminophen (NORCO/VICODIN) 5-325 MG tablet Take 1 tablet by mouth every 4 (four) hours as needed. 04/29/22  Yes Jeannie Fend, PA-C  metroNIDAZOLE (FLAGYL) 500 MG tablet Take 1 tablet (500 mg total) by mouth 2 (two) times daily. 04/29/22  Yes Jeannie Fend, PA-C  ondansetron (ZOFRAN-ODT) 4 MG disintegrating tablet Take 1 tablet (4 mg total) by mouth every 8 (eight) hours as needed for nausea or vomiting. 04/29/22  Yes Jeannie Fend, PA-C  Cholecalciferol (VITAMIN D) 50 MCG (2000 UT) CAPS Take by mouth. 1 daily    [provider]  cyclobenzaprine (FLEXERIL) 5 MG tablet Take 1 tablet (5 mg total) by mouth 3 (three) times daily as needed for muscle spasms. 03/18/22   Kerri Perches, MD  ferrous sulfate 325 (65 FE) MG EC tablet Take 325 mg by mouth 3 (three) times  daily with meals. 1 daily    [provider]  Fremanezumab-vfrm (AJOVY) 225 MG/1.5ML SOAJ INJECT 225MG  INTO THE SKIN EVERY 30 DAYS 04/28/22   Penumalli, 04/30/22, MD  montelukast (SINGULAIR) 10 MG tablet Take 1 tablet (10 mg total) by mouth at bedtime. 03/25/22   05/25/22, MD  naproxen (NAPROSYN) 500 MG tablet TAKE 1 TABLET(500 MG) BY MOUTH TWICE DAILY WITH A MEAL FOR 10 DAYS 04/08/22   04/10/22, MD  phentermine (ADIPEX-P) 37.5 MG tablet Take half tablet by mouth once daily 03/25/22   05/25/22, MD      Allergies    Latex, Sulfa antibiotics, Topamax [topiramate], and Augmentin [amoxicillin-pot clavulanate]    Review of Systems   Review of Systems Negative except as per HPI Physical Exam Updated Vital Signs BP 107/79   Pulse 82   Temp 98.8 F (37.1 C)   Resp 16   Ht 5\' 7"  (1.702 m)   Wt 110.7 kg   LMP 03/13/2022 (Approximate)   SpO2 98%   BMI 38.22 kg/m  Physical Exam Vitals and nursing note reviewed.  Constitutional:      General: She is not in acute distress.    Appearance: She is well-developed. She is not diaphoretic.  HENT:     Head: Normocephalic and atraumatic.  Cardiovascular:     Rate and Rhythm: Normal rate and regular rhythm.     Heart sounds:  Normal heart sounds.  Pulmonary:     Effort: Pulmonary effort is normal.  Abdominal:     General: Bowel sounds are normal.     Palpations: Abdomen is soft.     Tenderness: There is abdominal tenderness in the left upper quadrant and left lower quadrant.     Hernia: No hernia is present.  Skin:    General: Skin is warm and dry.  Neurological:     Mental Status: She is alert and oriented to person, place, and time.  Psychiatric:        Behavior: Behavior normal.     ED Results / Procedures / Treatments   Labs (all labs ordered are listed, but only abnormal results are displayed) Labs Reviewed  COMPREHENSIVE METABOLIC PANEL - Abnormal; Notable for the following components:       Result Value   Glucose, Bld 128 (*)    AST 11 (*)    All other components within normal limits  CBC - Abnormal; Notable for the following components:   Hemoglobin 11.8 (*)    All other components within normal limits  HCG, SERUM, QUALITATIVE  OCCULT BLOOD X 1 CARD TO LAB, STOOL    EKG None  Radiology CT Abdomen Pelvis W Contrast  Result Date: 04/29/2022 CLINICAL DATA:  Left lower quadrant abdominal pain. Rectal bleeding. EXAM: CT ABDOMEN AND PELVIS WITH CONTRAST TECHNIQUE: Multidetector CT imaging of the abdomen and pelvis was performed using the standard protocol following bolus administration of intravenous contrast. RADIATION DOSE REDUCTION: This exam was performed according to the departmental dose-optimization program which includes automated exposure control, adjustment of the mA and/or kV according to patient size and/or use of iterative reconstruction technique. CONTRAST:  3mL OMNIPAQUE IOHEXOL 300 MG/ML  SOLN COMPARISON:  CT December 17, 2016. FINDINGS: Lower chest: No acute abnormality. Hepatobiliary: No suspicious hepatic lesion. Gallbladder is unremarkable. No biliary ductal dilation. Pancreas: No pancreatic ductal dilation or evidence of acute inflammation. Spleen: No splenomegaly. Adrenals/Urinary Tract: Bilateral adrenal glands appear normal. No hydronephrosis. Kidneys demonstrate symmetric enhancement. Urinary bladder is minimally distended limiting evaluation. Stomach/Bowel: Stomach is unremarkable for degree of distension. No pathologic dilation of large or small bowel. The appendix appears normal. Fluid attenuation nodularity at the ileocecal valve on axial image 54 and coronal image 83 likely reflects enteric contents traversing the valve. Scattered left-sided colonic diverticulosis without findings of acute diverticulitis. Vascular/Lymphatic: Normal caliber abdominal aorta. No pathologically enlarged abdominal or pelvic lymph nodes. Reproductive: Uterus and bilateral adnexa are  unremarkable. Other: Trace pelvic free fluid is within physiologic normal limits. Musculoskeletal: No acute osseous abnormality. IMPRESSION: 1. No acute abnormality in the abdomen or pelvis. 2. Scattered left-sided colonic diverticulosis without findings of acute diverticulitis. Electronically Signed   By: Maudry Mayhew M.D.   On: 04/29/2022 14:57    Procedures Procedures    Medications Ordered in ED Medications  sodium chloride 0.9 % bolus 1,000 mL (1,000 mLs Intravenous New Bag/Given 04/29/22 1400)  iohexol (OMNIPAQUE) 300 MG/ML solution 100 mL (85 mLs Intravenous Contrast Given 04/29/22 1449)    ED Course/ Medical Decision Making/ A&P                           Medical Decision Making Amount and/or Complexity of Data Reviewed Labs: ordered.   This patient presents to the ED for concern of abdominal pain, this involves an extensive number of treatment options, and is a complaint that carries with it a high risk of  complications and morbidity.  The differential diagnosis includes but not limited to gastroenteritis, colitis, diverticulitis, GI bleed, hemorrhoid   Co morbidities that complicate the patient evaluation  History of internal hemorrhoids, IBS, fibromyalgia, migraines.  Recently treated with NSAIDs for plantar fasciitis   Additional history obtained:  External records from outside source obtained and reviewed including patient message to PCP regarding her bleeding concern and presentation today.   Lab Tests:  I Ordered, and personally interpreted labs.  The pertinent results include: CBC unremarkable, hCG negative.  CMP without significant findings.   Imaging Studies ordered:  I ordered imaging studies including CT abdomen pelvis with contrast I independently visualized and interpreted imaging which showed no obvious fluid collection I agree with the radiologist interpretation    Consultations Obtained:  I requested consultation with the ER attending,  and  discussed lab and imaging findings as well as pertinent plan - they recommend: agree with plan of care   Problem List / ED Course / Critical interventions / Medication management  36 year old female with concern for bright red blood per rectum with abdominal pain as above.  On exam, found to have generalized abdominal tenderness, worse on the left.  Labs reviewed and largely reassuring, normal white count normal afebrile.  CT with concern for diverticular cyst, given patient's bleeding and pain, will treat for diverticulitis.  She is allergic to Augmentin, as prescribed Cipro and Flagyl.  Recommend follow-up with primary care provider for recheck, consider colonoscopy due to rectal bleeding.  Return to ER for fever, pain or vomiting not controlled with medications. I ordered medication including IV fluids for hydration post CT scan with contrast patient declined pain medications and antiemetics. Reevaluation of the patient after these medicines showed that the patient improved I have reviewed the patients home medicines and have made adjustments as needed   Social Determinants of Health:  Has PCP for follow-up   Test / Admission - Considered:  After comprehensive evaluation in the emergency room, patient is felt stable for discharge to recheck with PCP, recommend follow-up with GI regarding her rectal bleeding concerns.         Final Clinical Impression(s) / ED Diagnoses Final diagnoses:  Diverticulitis  Rectal bleeding    Rx / DC Orders ED Discharge Orders          Ordered    ciprofloxacin (CIPRO) 500 MG tablet  Every 12 hours        04/29/22 1510    metroNIDAZOLE (FLAGYL) 500 MG tablet  2 times daily        04/29/22 1510    HYDROcodone-acetaminophen (NORCO/VICODIN) 5-325 MG tablet  Every 4 hours PRN        04/29/22 1510    ondansetron (ZOFRAN-ODT) 4 MG disintegrating tablet  Every 8 hours PRN        04/29/22 1510              Jeannie Fend, PA-C 04/29/22  1513    Glyn Ade, MD 04/30/22 [phone]

## 2022-04-29 NOTE — ED Triage Notes (Signed)
Pt reports intermittent rectal bleeding and abdominal pain since this past Saturday. Also Reports some nausea, back pain, cough, and generalized weakness. Pt is AxOx4.

## 2022-05-31 ENCOUNTER — Telehealth: Payer: BC Managed Care – PPO | Admitting: Family

## 2022-05-31 DIAGNOSIS — J208 Acute bronchitis due to other specified organisms: Secondary | ICD-10-CM

## 2022-05-31 DIAGNOSIS — B9689 Other specified bacterial agents as the cause of diseases classified elsewhere: Secondary | ICD-10-CM

## 2022-05-31 MED ORDER — BENZONATATE 100 MG PO CAPS: 100.0000 mg | ORAL_CAPSULE | Freq: Three times a day (TID) | ORAL | 0 refills | Status: DC | PRN

## 2022-05-31 MED ORDER — AZITHROMYCIN 250 MG PO TABS: ORAL_TABLET | ORAL | 0 refills | Status: DC

## 2022-05-31 MED ORDER — PREDNISONE 10 MG (21) PO TBPK: ORAL_TABLET | ORAL | 0 refills | Status: DC

## 2022-05-31 NOTE — Progress Notes (Signed)
Virtual Visit Consent   Yvette Conley, you are scheduled for a virtual visit with a [facility] provider today. Just as with appointments in the office, your consent must be obtained to participate. Your consent will be active for this visit and any virtual visit you may have with one of our providers in the next 365 days. If you have a MyChart account, a copy of this consent can be sent to you electronically.  As this is a virtual visit, video technology does not allow for your provider to perform a traditional examination. This may limit your provider's ability to fully assess your condition. If your provider identifies any concerns that need to be evaluated in person or the need to arrange testing (such as labs, EKG, etc.), we will make arrangements to do so. Although advances in technology are sophisticated, we cannot ensure that it will always work on either your end or our end. If the connection with a video visit is poor, the visit may have to be switched to a telephone visit. With either a video or telephone visit, we are not always able to ensure that we have a secure connection.  By engaging in this virtual visit, you consent to the provision of healthcare and authorize for your insurance to be billed (if applicable) for the services provided during this visit. Depending on your insurance coverage, you may receive a charge related to this service.  I need to obtain your verbal consent now. Are you willing to proceed with your visit today? Pryor Montes has provided verbal consent on 05/31/2022 for a virtual visit (video or telephone). Jannifer Rodney, FNP  Date: 05/31/2022 2:05 PM  Virtual Visit via Video Note   I, Jannifer Rodney, connected with  Yvette Conley  ([id number], [birth year]) on 05/31/22 at  2:00 PM EST by a video-enabled telemedicine application and verified that I am speaking with the correct person using two identifiers.  Location: Patient: Virtual Visit Location Patient:  Other: car Provider: Virtual Visit Location Provider: Home Office   I discussed the limitations of evaluation and management by telemedicine and the availability of in person appointments. The patient expressed understanding and agreed to proceed.    History of Present Illness: Yvette Conley is a 36 y.o. who identifies as a female who was assigned female at birth, and is being seen today for cough and chest congestion that started two weeks ago.  HPI: Cough This is a new problem. The current episode started 1 to 4 weeks ago. The problem has been gradually worsening. The problem occurs every few minutes. The cough is Productive of sputum and productive of purulent sputum. Associated symptoms include ear congestion, ear pain, headaches, myalgias, nasal congestion and postnasal drip. Pertinent negatives include no chills, fever, shortness of breath or wheezing. She has tried rest and OTC cough suppressant for the symptoms. The treatment provided mild relief.    Problems:  Patient Active Problem List   Diagnosis Date Noted   Maxillary sinusitis 03/25/2022   Allergic sinusitis 03/25/2022   Plantar fasciitis, bilateral 03/25/2022   Morbid obesity (HCC) 10/20/2021   Migraine with aura and without status migrainosus, not intractable 08/26/2021   Fibromyalgia 05/15/2021   Fatigue 05/15/2021   Iron deficiency anemia 05/15/2021   Low libido 05/15/2021   Vitamin D deficiency 05/15/2021   Migraine without aura and without status migrainosus, not intractable 08/08/2020   IBS (irritable bowel syndrome) 02/23/2017   Back spasm 04/09/2014   Paroxysmal dystonia 02/08/2011  Obesity 02/08/2011   HEMORRHOIDS-INTERNAL 08/15/2010    Allergies:  Allergies  Allergen Reactions   Latex Other (See Comments) and Rash    Reaction: burning  Reaction: burning    Sulfa Antibiotics Hives and Other (See Comments)    Torso, chest areas   Topamax [Topiramate] Other (See Comments)    Dystonia/ muscle spasm, 2023    Augmentin [Amoxicillin-Pot Clavulanate] Rash   Medications:  Current Outpatient Medications:    azithromycin (ZITHROMAX) 250 MG tablet, Take 500 mg once, then 250 mg for four days, Disp: 6 tablet, Rfl: 0   benzonatate (TESSALON PERLES) 100 MG capsule, Take 1 capsule (100 mg total) by mouth 3 (three) times daily as needed., Disp: 20 capsule, Rfl: 0   predniSONE (STERAPRED UNI-PAK 21 TAB) 10 MG (21) TBPK tablet, Use as directed, Disp: 21 tablet, Rfl: 0   Cholecalciferol (VITAMIN D) 50 MCG (2000 UT) CAPS, Take by mouth. 1 daily, Disp: , Rfl:    ciprofloxacin (CIPRO) 500 MG tablet, Take 1 tablet (500 mg total) by mouth every 12 (twelve) hours., Disp: 10 tablet, Rfl: 0   cyclobenzaprine (FLEXERIL) 5 MG tablet, Take 1 tablet (5 mg total) by mouth 3 (three) times daily as needed for muscle spasms., Disp: 30 tablet, Rfl: 1   ferrous sulfate 325 (65 FE) MG EC tablet, Take 325 mg by mouth 3 (three) times daily with meals. 1 daily, Disp: , Rfl:    Fremanezumab-vfrm (AJOVY) 225 MG/1.5ML SOAJ, INJECT 225MG  INTO THE SKIN EVERY 30 DAYS, Disp: 1.5 mL, Rfl: 1   HYDROcodone-acetaminophen (NORCO/VICODIN) 5-325 MG tablet, Take 1 tablet by mouth every 4 (four) hours as needed., Disp: 10 tablet, Rfl: 0   metroNIDAZOLE (FLAGYL) 500 MG tablet, Take 1 tablet (500 mg total) by mouth 2 (two) times daily., Disp: 14 tablet, Rfl: 0   montelukast (SINGULAIR) 10 MG tablet, Take 1 tablet (10 mg total) by mouth at bedtime., Disp: 30 tablet, Rfl: 3   naproxen (NAPROSYN) 500 MG tablet, TAKE 1 TABLET(500 MG) BY MOUTH TWICE DAILY WITH A MEAL FOR 10 DAYS, Disp: 20 tablet, Rfl: 0   ondansetron (ZOFRAN-ODT) 4 MG disintegrating tablet, Take 1 tablet (4 mg total) by mouth every 8 (eight) hours as needed for nausea or vomiting., Disp: 12 tablet, Rfl: 0   phentermine (ADIPEX-P) 37.5 MG tablet, Take half tablet by mouth once daily, Disp: 15 tablet, Rfl: 3  Observations/Objective: Patient is well-developed, well-nourished in no acute  distress.  Resting comfortably  at home.  Head is normocephalic, atraumatic.  No labored breathing.  Speech is clear and coherent with logical content.  Patient is alert and oriented at baseline.  Chest congestion, and coarse cough  Assessment and Plan: 1. Acute bacterial bronchitis - azithromycin (ZITHROMAX) 250 MG tablet; Take 500 mg once, then 250 mg for four days  Dispense: 6 tablet; Refill: 0 - predniSONE (STERAPRED UNI-PAK 21 TAB) 10 MG (21) TBPK tablet; Use as directed  Dispense: 21 tablet; Refill: 0 - benzonatate (TESSALON PERLES) 100 MG capsule; Take 1 capsule (100 mg total) by mouth 3 (three) times daily as needed.  Dispense: 20 capsule; Refill: 0  - Take meds as prescribed - Use a cool mist humidifier  -Use saline nose sprays frequently -Force fluids -For any cough or congestion  Use plain Mucinex- regular strength or max strength is fine -For fever or aces or pains- take tylenol or ibuprofen. -Throat lozenges if help -Follow up if symptoms worsen or do not improve   Follow Up Instructions: I  discussed the assessment and treatment plan with the patient. The patient was provided an opportunity to ask questions and all were answered. The patient agreed with the plan and demonstrated an understanding of the instructions.  A copy of instructions were sent to the patient via MyChart unless otherwise noted below.    The patient was advised to call back or seek an in-person evaluation if the symptoms worsen or if the condition fails to improve as anticipated.  Time:  I spent 7 minutes with the patient via telehealth technology discussing the above problems/concerns.    Jannifer Rodney, FNP

## 2022-06-03 ENCOUNTER — Ambulatory Visit: Payer: BC Managed Care – PPO | Admitting: Internal Medicine

## 2022-06-17 ENCOUNTER — Encounter: Payer: Self-pay | Admitting: Internal Medicine

## 2022-06-17 ENCOUNTER — Ambulatory Visit: Payer: BC Managed Care – PPO | Admitting: Internal Medicine

## 2022-06-17 DIAGNOSIS — M79671 Pain in right foot: Secondary | ICD-10-CM | POA: Diagnosis not present

## 2022-06-17 DIAGNOSIS — M722 Plantar fascial fibromatosis: Secondary | ICD-10-CM
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
## 2022-06-17 NOTE — Patient Instructions (Addendum)
Thank you for trusting me with your care. To recap, today we discussed the following:  Heel Spur Steroid injection today.  Follow up if pain returns.

## 2022-06-19 MED ORDER — TRIAMCINOLONE ACETONIDE 40 MG/ML IJ SUSP
40.0000 mg | Freq: Once | INTRAMUSCULAR | Status: AC
  Administered 2022-06-17: 40 mg via INTRA_ARTICULAR

## 2022-06-20 DIAGNOSIS — M79671 Pain in right foot: Secondary | ICD-10-CM | POA: Insufficient documentation

## 2022-06-20 NOTE — Assessment & Plan Note (Addendum)
Patient presented today with concern for right heel pain. Her plantar fascitis pain is not bothering her on either foot. She has pain in heel , approximately in the middle of heel and medial side. Previous imaging showed right heel spur. No radiation of pain.  Assessment/Plan: Chronic illness with exacerbation I suspect inflammation from heel spur compression into soft tissue of heel pad.  This pain has not resolved with NSAIDs and exercise she has performed for plantar fascitis.  - Steroid injection in right heel today  Plantar Fascia Injection Procedure Note IA LEEB Aug 21, 1985 06/17/2022  Procedure: CPT Code 08144 Indications: Pain  Procedure Details Written consent obtained. Risks including risk of rupture reviewed in addition to benefits and alternatives were reviewed. Povidone-Iodone swabs used for prep. Ethyl Chloride used for anesthesia. Under sterile conditions, using the medial approach 2 cc of Lidocaine 1% and Kenalog 40 mg injected superior to plantar fascia and fanned. No compications. Decreased pain post-injection.

## 2022-07-03 ENCOUNTER — Other Ambulatory Visit: Payer: Self-pay | Admitting: Family Medicine

## 2022-07-03 ENCOUNTER — Encounter: Payer: Self-pay | Admitting: Family Medicine

## 2022-07-03 ENCOUNTER — Ambulatory Visit: Payer: BC Managed Care – PPO | Admitting: Family Medicine

## 2022-07-03 VITALS — BP 108/77 | HR 95 | Ht 67.0 in | Wt 237.0 lb

## 2022-07-03 DIAGNOSIS — R109 Unspecified abdominal pain: Secondary | ICD-10-CM

## 2022-07-03 DIAGNOSIS — N926 Irregular menstruation, unspecified: Secondary | ICD-10-CM

## 2022-07-03 DIAGNOSIS — K219 Gastro-esophageal reflux disease without esophagitis: Secondary | ICD-10-CM

## 2022-07-03 DIAGNOSIS — E6609 Other obesity due to excess calories: Secondary | ICD-10-CM

## 2022-07-03 DIAGNOSIS — Z6837 Body mass index (BMI) 37.0-37.9, adult: Secondary | ICD-10-CM

## 2022-07-03 MED ORDER — PANTOPRAZOLE SODIUM 20 MG PO TBEC: 20.0000 mg | DELAYED_RELEASE_TABLET | Freq: Every day | ORAL | 0 refills | Status: DC

## 2022-07-03 NOTE — Patient Instructions (Addendum)
F/U in 4 months, call if you need me sooner  Breath test for H Pylori and Quantitative HCG test today  You are referred for [place] of gall bladder  Medication for burning pain, protonix,  is prescribed You are referred to GI for abdominal pain evaluation  Thanks for choosing [facility], we consider it a privelige to serve you.

## 2022-07-04 LAB — HCG, SERUM, QUALITATIVE: hCG,Beta Subunit,Qual,Serum: NEGATIVE m[IU]/mL (ref <6)

## 2022-07-05 ENCOUNTER — Encounter: Payer: Self-pay | Admitting: Family Medicine

## 2022-07-05 DIAGNOSIS — R109 Unspecified abdominal pain: Secondary | ICD-10-CM | POA: Insufficient documentation

## 2022-07-05 LAB — H. PYLORI BREATH TEST: H pylori Breath Test: NEGATIVE

## 2022-07-05 NOTE — Assessment & Plan Note (Addendum)
Epigastric tenderness on exam, h/o recurrent bloating and dyspepsia, needs RUQ ultrasound, beath test and trial of protonix, refer to GI, also reports recurrent intermittent RLQ pain, GI to eval

## 2022-07-05 NOTE — Assessment & Plan Note (Addendum)
Recent report of prolonged amenorrheah with passage of "tissue" recently, check HCG

## 2022-07-05 NOTE — Assessment & Plan Note (Signed)
  Patient re-educated about  the importance of commitment to a  minimum of 150 minutes of exercise per week as able.  The importance of healthy food choices with portion control discussed, as well as eating regularly and within a 12 hour window most days. The need to choose "clean , green" food 50 to 75% of the time is discussed, as well as to make water the primary drink and set a goal of 64 ounces water daily.       07/03/2022    3:08 PM 06/17/2022    4:16 PM 04/29/2022    1:19 PM  Weight /BMI  Weight 237 lb 0.6 oz 242 lb 244 lb  Height 5\' 7"  (1.702 m) 5\' 7"  (1.702 m) 5\' 7"  (1.702 m)  BMI 37.13 kg/m2 37.9 kg/m2 38.22 kg/m2    improved

## 2022-07-05 NOTE — Progress Notes (Addendum)
   Yvette Conley     MRN: [id number]      DOB: 1985-10-08   HPI Ms. Lichter is here for follow up and re-evaluation of chronic medical conditions, medication management and review of any available recent lab and radiology data.  C/o recurrent lower abdominal pain esp right sided intermittently since presented to the ED on 04/29/2022 when dx with diverticulosis. States has pain up to  times per week, up to a 10 when severe , causes her to stop in her tracks and double over Also recently saw Gyne who thinks she may have Polycystic ovaries, long h/o irregular menses, had no cycle for several months, and following recent Gyne exam tone week ago had irregular bleed with what resembled tissue C/o intermittent nausea and bloating with LUQ pain for months Stopped phentermine and working  on lifestyle change for weight loss, wanted to start training however recommend holding off at this time, recurrent severe abdominal pain ROS Denies recent fever or chills. Denies sinus pressure, nasal congestion, ear pain or sore throat. Denies chest congestion, productive cough or wheezing. Denies chest pains, palpitations and leg swelling .   Denies dysuria, frequency, hesitancy or incontinence. Denies joint pain, swelling and limitation in mobility. Denies headaches, seizures, numbness, or tingling. Denies depression, anxiety or insomnia. Denies skin break down or rash.        PE  BP 108/77 (BP Location: Right Arm, Patient Position: Sitting, Cuff Size: Large)   Pulse 95   Ht 5\' 7"  (1.702 m)   Wt 237 lb 0.6 oz (107.5 kg)   SpO2 94%   BMI 37.13 kg/m   Patient alert and oriented and in no cardiopulmonary distress.  HEENT: No facial asymmetry, EOMI,     Neck supple .  Chest: Clear to auscultation bilaterally.  CVS: S1, S2 no murmurs, no S3.Regular rate.  ABD: Soft RUQ ytenderness, also RLQ tenderness, no guarding or rebound, no palpable organomegaly or mass Ext: No edema  MS: Adequate ROM spine,  shoulders, hips and knees.  Skin: Intact, no ulcerations or rash noted.  Psych: Good eye contact, normal affect. Memory intact not anxious or depressed appearing.  CNS: CN 2-12 intact, power,  normal throughout.no focal deficits noted.   Assessment & Plan   Abdominal pain Epigastric tenderness on exam, h/o recurrent bloating and dyspepsia, needs RUQ ultrasound, beath test and trial of protonix, refer to GI, also reports recurrent intermittent RLQ pain, GI to eval  Obesity  Patient re-educated about  the importance of commitment to a  minimum of 150 minutes of exercise per week as able.  The importance of healthy food choices with portion control discussed, as well as eating regularly and within a 12 hour window most days. The need to choose "clean , green" food 50 to 75% of the time is discussed, as well as to make water the primary drink and set a goal of 64 ounces water daily.       07/03/2022    3:08 PM 06/17/2022    4:16 PM 04/29/2022    1:19 PM  Weight /BMI  Weight 237 lb 0.6 oz 242 lb 244 lb  Height 5\' 7"  (1.702 m) 5\' 7"  (1.702 m) 5\' 7"  (1.702 m)  BMI 37.13 kg/m2 37.9 kg/m2 38.22 kg/m2    improved  Irregular menses Recent report of prolonged amenorrheah with passage of "tissue" recently, check HCG

## 2022-07-06 ENCOUNTER — Encounter: Payer: Self-pay | Admitting: Gastroenterology

## 2022-07-09 ENCOUNTER — Telehealth: Payer: Self-pay | Admitting: Family Medicine

## 2022-07-09 ENCOUNTER — Emergency Department (HOSPITAL_BASED_OUTPATIENT_CLINIC_OR_DEPARTMENT_OTHER)
Admission: EM | Admit: 2022-07-09 | Discharge: 2022-07-09 | Disposition: A | Discharge status: Home / Self Care | Payer: BC Managed Care – PPO | Attending: Emergency Medicine | Admitting: Emergency Medicine

## 2022-07-09 ENCOUNTER — Encounter (HOSPITAL_BASED_OUTPATIENT_CLINIC_OR_DEPARTMENT_OTHER): Payer: Self-pay | Admitting: Emergency Medicine

## 2022-07-09 ENCOUNTER — Encounter: Payer: Self-pay | Admitting: Family Medicine

## 2022-07-09 ENCOUNTER — Other Ambulatory Visit: Payer: Self-pay

## 2022-07-09 ENCOUNTER — Emergency Department (HOSPITAL_BASED_OUTPATIENT_CLINIC_OR_DEPARTMENT_OTHER): Payer: BC Managed Care – PPO

## 2022-07-09 DIAGNOSIS — N939 Abnormal uterine and vaginal bleeding, unspecified: Secondary | ICD-10-CM | POA: Diagnosis not present

## 2022-07-09 DIAGNOSIS — R102 Pelvic and perineal pain: Secondary | ICD-10-CM | POA: Insufficient documentation

## 2022-07-09 LAB — WET PREP, GENITAL
Clue Cells Wet Prep HPF POC: NONE SEEN
Sperm: NONE SEEN
Trich, Wet Prep: NONE SEEN
WBC, Wet Prep HPF POC: <10 (ref <10)
Yeast Wet Prep HPF POC: NONE SEEN

## 2022-07-09 LAB — COMPREHENSIVE METABOLIC PANEL
ALT: 16 U/L (ref 0–44)
AST: 19 U/L (ref 15–41)
Albumin: 4.5 g/dL (ref 3.5–5.0)
Alkaline Phosphatase: 55 U/L (ref 38–126)
Anion gap: 10 (ref 5–15)
BUN: 8 mg/dL (ref 6–20)
CO2: 24 mmol/L (ref 22–32)
Calcium: 9.8 mg/dL (ref 8.9–10.3)
Chloride: 105 mmol/L (ref 98–111)
Creatinine, Ser: 0.66 mg/dL (ref 0.44–1.00)
GFR, Estimated: >60 mL/min (ref >60)
Glucose, Bld: 80 mg/dL (ref 70–99)
Potassium: 4 mmol/L (ref 3.5–5.1)
Sodium: 139 mmol/L (ref 135–145)
Total Bilirubin: 0.5 mg/dL (ref 0.3–1.2)
Total Protein: 8.3 g/dL — ABNORMAL HIGH (ref 6.5–8.1)

## 2022-07-09 LAB — CBC
HCT: 36.3 % (ref 36.0–46.0)
Hemoglobin: 11.8 g/dL — ABNORMAL LOW (ref 12.0–15.0)
MCH: 30 pg (ref 26.0–34.0)
MCHC: 32.5 g/dL (ref 30.0–36.0)
MCV: 92.4 fL (ref 80.0–100.0)
Platelets: 366 10*3/uL (ref 150–400)
RBC: 3.93 MIL/uL (ref 3.87–5.11)
RDW: 12.2 % (ref 11.5–15.5)
WBC: 11.2 10*3/uL — ABNORMAL HIGH (ref 4.0–10.5)
nRBC: 0 % (ref 0.0–0.2)

## 2022-07-09 LAB — URINALYSIS, ROUTINE W REFLEX MICROSCOPIC
Bilirubin Urine: NEGATIVE
Glucose, UA: NEGATIVE mg/dL
Ketones, ur: 15 mg/dL — AB
Nitrite: NEGATIVE
Protein, ur: NEGATIVE mg/dL
RBC / HPF: >50 RBC/hpf (ref 0–5)
Specific Gravity, Urine: 1.011 (ref 1.005–1.030)
pH: 5.5 (ref 5.0–8.0)

## 2022-07-09 LAB — LIPASE, BLOOD: Lipase: 21 U/L (ref 11–51)

## 2022-07-09 LAB — PREGNANCY, URINE: Preg Test, Ur: NEGATIVE

## 2022-07-09 MED ORDER — NAPROXEN 500 MG PO TABS: 500.0000 mg | ORAL_TABLET | Freq: Two times a day (BID) | ORAL | 0 refills | Status: DC

## 2022-07-09 NOTE — ED Triage Notes (Signed)
Abdominal pain  Worsening in last 2 weeks Vaginal bleeding since 06/27/2023 and pain/cramping with clots  Seen by pcp on friday

## 2022-07-09 NOTE — ED Provider Notes (Signed)
[place] EMERGENCY DEPARTMENT AT [facility] Provider Note   CSN: [id number] Arrival date & time: 07/09/22  1446     History  Chief Complaint  Patient presents with   Abdominal Pain    Yvette Conley is a 37 y.o. female.  Patient presents to the emergency department today for abdominal pain and vaginal bleeding.  The symptoms have been going on for several weeks.  Her vaginal bleeding started on/around January 12.  She has seen her PCP who scheduled a right upper quadrant ultrasound and GI follow-up.  Patient states that she contacted her OB/GYN recently and was encouraged to come to the emergency department for further evaluation.  No lightheadedness or syncope.  Bleeding is waxing and waning, was improved earlier in the week but then returned yesterday.  She has passed some clots at times.  Around the time of onset she thought that she passed some tissue, but her pregnancy test was negative.  No fevers, nausea, vomiting, diarrhea.       Home Medications Prior to Admission medications   Medication Sig Start Date End Date Taking? Authorizing Provider  cyclobenzaprine (FLEXERIL) 5 MG tablet Take 1 tablet (5 mg total) by mouth 3 (three) times daily as needed for muscle spasms. 03/18/22   Kerri Perches, MD  Fremanezumab-vfrm (AJOVY) 225 MG/1.5ML SOAJ INJECT 225MG  INTO THE SKIN EVERY 30 DAYS 04/28/22   Penumalli, 04/30/22, MD  HYDROcodone-acetaminophen (NORCO/VICODIN) 5-325 MG tablet Take 1 tablet by mouth every 4 (four) hours as needed. 04/29/22   05/01/22, PA-C  montelukast (SINGULAIR) 10 MG tablet Take 1 tablet (10 mg total) by mouth at bedtime. 03/25/22   05/25/22, MD  pantoprazole (PROTONIX) 20 MG tablet Take 1 tablet (20 mg total) by mouth daily. 07/03/22   07/05/22, MD      Allergies    Latex, Sulfa antibiotics, Topamax [topiramate], and Augmentin [amoxicillin-pot clavulanate]    Review of Systems   Review of Systems  Physical  Exam Updated Vital Signs BP 117/73   Pulse 84   Temp 97.8 F (36.6 C)   Resp 18   SpO2 100%  Physical Exam Vitals and nursing note reviewed. Exam conducted with a chaperone present.  Constitutional:      General: She is not in acute distress.    Appearance: She is well-developed.  HENT:     Head: Normocephalic and atraumatic.     Right Ear: External ear normal.     Left Ear: External ear normal.     Nose: Nose normal.  Eyes:     Conjunctiva/sclera: Conjunctivae normal.  Cardiovascular:     Rate and Rhythm: Normal rate and regular rhythm.     Heart sounds: No murmur heard. Pulmonary:     Effort: No respiratory distress.     Breath sounds: No wheezing, rhonchi or rales.  Abdominal:     Palpations: Abdomen is soft.     Tenderness: There is abdominal tenderness in the right lower quadrant, periumbilical area, suprapubic area and left lower quadrant. There is no guarding or rebound.  Genitourinary:    Labia:        Right: No lesion.        Left: No lesion.      Vagina: Bleeding present. No tenderness.     Cervix: Cervical bleeding present.     Uterus: Tender.      Adnexa:        Right: Tenderness present.  Left: No tenderness.       Comments: Mild right adnexal tenderness, no significant left adnexal tenderness, uterus is mildly tender.  There is dark red blood noted in the vaginal vault without clots.  This appears to be coming from the cervix. Musculoskeletal:     Cervical back: Normal range of motion and neck supple.     Right lower leg: No edema.     Left lower leg: No edema.  Skin:    General: Skin is warm and dry.     Findings: No rash.  Neurological:     General: No focal deficit present.     Mental Status: She is alert. Mental status is at baseline.     Motor: No weakness.  Psychiatric:        Mood and Affect: Mood normal.     ED Results / Procedures / Treatments   Labs (all labs ordered are listed, but only abnormal results are displayed) Labs  Reviewed  COMPREHENSIVE METABOLIC PANEL - Abnormal; Notable for the following components:      Result Value   Total Protein 8.3 (*)    All other components within normal limits  CBC - Abnormal; Notable for the following components:   WBC 11.2 (*)    Hemoglobin 11.8 (*)    All other components within normal limits  URINALYSIS, ROUTINE W REFLEX MICROSCOPIC - Abnormal; Notable for the following components:   Color, Urine COLORLESS (*)    APPearance HAZY (*)    Hgb urine dipstick LARGE (*)    Ketones, ur 15 (*)    Leukocytes,Ua SMALL (*)    Bacteria, UA RARE (*)    All other components within normal limits  WET PREP, GENITAL  LIPASE, BLOOD  PREGNANCY, URINE  GC/CHLAMYDIA PROBE AMP ([place]) NOT AT [facility]    EKG None  Radiology No results found.  Procedures Procedures    Medications Ordered in ED Medications - No data to display  ED Course/ Medical Decision Making/ A&P    Patient seen and examined. History obtained directly from patient. Work-up including labs, imaging, EKG ordered in triage, if performed, were reviewed.    Labs/EKG: Independently reviewed and interpreted.  This included: CBC with white blood cell count 11.2, hemoglobin 11.8 which is stable; CMP unremarkable; lipase normal; UA contaminated by blood, urine pregnancy negative.  Pelvic exam performed with NT chaperone.  Wet prep ordered which was unremarkable.  Imaging: Ordered pelvic ultrasound  Medications/Fluids: None ordered  Most recent vital signs reviewed and are as follows: BP 117/73   Pulse 84   Temp 97.8 F (36.6 C)   Resp 18   SpO2 100%   Initial impression: Ongoing abdominal pain and tenderness, pelvic pain, irregular vaginal bleeding without significant anemia, reassuring vitals.  8:53 PM Reassessment performed. Patient appears stable.   Imaging results reviewed: Ultrasound pelvis, endometrium as noted, no other abnormalities  Reviewed pertinent lab work and imaging with patient  at bedside. Questions answered.   Most current vital signs reviewed and are as follows: BP 115/77   Pulse 83   Temp 98.1 F (36.7 C) (Oral)   Resp 18   SpO2 100%   Plan: Discharge to home.   Prescriptions written for: Naproxen  Other home care instructions discussed: OTC meds  ED return instructions discussed: The patient was urged to return to the Emergency Department immediately with worsening of current symptoms, heavier bleeding, lightheadedness or syncope, worsening abdominal pain, persistent vomiting, blood noted in stools, fever, or  any other concerns. The patient verbalized understanding.   Follow-up instructions discussed: Patient encouraged to follow-up with their OB/GYN in 1 week.                             Medical Decision Making Amount and/or Complexity of Data Reviewed Labs: ordered. Radiology: ordered.   Patient here with vaginal bleeding, ongoing, negative pregnancy.  Vital signs are reassuring and hemoglobin is unaffected.  Ultrasound without any acute emergencies.  Will have patient take scheduled NSAIDs, follow-up with GYN for further treatment recommendations.  Return instructions as discussed.  Low concern for PID, tubo-ovarian abscess.  The patient's vital signs, pertinent lab work and imaging were reviewed and interpreted as discussed in the ED course. Hospitalization was considered for further testing, treatments, or serial exams/observation. However as patient is well-appearing, has a stable exam, and reassuring studies today, I do not feel that they warrant admission at this time. This plan was discussed with the patient who verbalizes agreement and comfort with this plan and seems reliable and able to return to the Emergency Department with worsening or changing symptoms.          Final Clinical Impression(s) / ED Diagnoses Final diagnoses:  Vaginal bleeding    Rx / DC Orders ED Discharge Orders          Ordered    naproxen (NAPROSYN) 500 MG  tablet  2 times daily        07/09/22 2052              Carlisle Cater, Hershal Coria 07/09/22 2055    Fredia Sorrow, MD 07/10/22 2328

## 2022-07-09 NOTE — Telephone Encounter (Signed)
Made her aware of new appt 2/5 at 8am. She talked with her gyne and was instructed to head to the ER so she is going to [employer].

## 2022-07-09 NOTE — Discharge Instructions (Addendum)
Please read and follow all provided instructions.  Your diagnoses today include:  1. Vaginal bleeding    Tests performed today include: Complete blood cell count: Mild anemia but stable from November Basic metabolic panel: No problems Pregnancy test (urine or blood, in women only): Negative Vaginal ultrasound shows thickening of the endometrium but no other issues Vital signs. See below for your results today.   Medications prescribed:  Naproxen - anti-inflammatory pain medication Do not exceed 500mg  naproxen every 12 hours, take with food  You have been prescribed an anti-inflammatory medication or NSAID. Take with food. Take smallest effective dose for the shortest duration needed for your pain. Stop taking if you experience stomach pain or vomiting.   Ibuprofen (Motrin, Advil) - anti-inflammatory pain medication Do not exceed 600mg  ibuprofen every 6 hours, take with food  You have been prescribed an anti-inflammatory medication or NSAID. Take with food. Take smallest effective dose for the shortest duration needed for your pain. Stop taking if you experience stomach pain or vomiting.   Take any prescribed medications only as directed.  Home care instructions:  Follow any educational materials contained in this packet.  BE VERY CAREFUL not to take multiple medicines containing Tylenol (also called acetaminophen). Doing so can lead to an overdose which can damage your liver and cause liver failure and possibly death.   Follow-up instructions: Please follow-up with your OB/GYN in the next 1 week for further evaluation and treatment.  Return instructions:  Please return to the Emergency Department if you experience worsening symptoms.  Please return if you have any other emergent concerns.  Additional Information:  Your vital signs today were: BP 115/77   Pulse 83   Temp 98.1 F (36.7 C) (Oral)   Resp 18   SpO2 100%  If your blood pressure (BP) was elevated above 135/85  this visit, please have this repeated by your doctor within one month. --------------

## 2022-07-10 LAB — GC/CHLAMYDIA PROBE AMP (~~LOC~~) NOT AT ARMC
Chlamydia: NEGATIVE
Comment: NEGATIVE
Comment: NORMAL
Neisseria Gonorrhea: NEGATIVE

## 2022-07-10 NOTE — Telephone Encounter (Signed)
Transition Care Management Unsuccessful Follow-up Telephone Call  Date of discharge and from where:  07/09/22 [facility]  Attempts:  1st Attempt  Reason for unsuccessful TCM follow-up call:  Unable to leave message

## 2022-07-13 ENCOUNTER — Other Ambulatory Visit: Payer: Self-pay | Admitting: *Deleted

## 2022-07-13 ENCOUNTER — Encounter: Payer: Self-pay | Admitting: *Deleted

## 2022-07-13 ENCOUNTER — Ambulatory Visit (INDEPENDENT_AMBULATORY_CARE_PROVIDER_SITE_OTHER): Payer: BC Managed Care – PPO | Admitting: Gastroenterology

## 2022-07-13 ENCOUNTER — Encounter: Payer: Self-pay | Admitting: Gastroenterology

## 2022-07-13 VITALS — BP 109/73 | HR 80 | Temp 98.1°F | Ht 67.0 in | Wt 238.8 lb

## 2022-07-13 DIAGNOSIS — K625 Hemorrhage of anus and rectum: Secondary | ICD-10-CM | POA: Diagnosis not present

## 2022-07-13 DIAGNOSIS — R1031 Right lower quadrant pain: Secondary | ICD-10-CM | POA: Diagnosis not present

## 2022-07-13 MED ORDER — PEG 3350-KCL-NA BICARB-NACL 420 G PO SOLR: 4000.0000 mL | Freq: Once | ORAL | 0 refills | Status: AC

## 2022-07-13 NOTE — Patient Instructions (Signed)
Complete upper abdominal ultrasound.  Colonoscopy with possible upper endoscopy to be done after your ultrasound is complete.  Make sure you are keeping your bowels moving well. If you are having to strain or stools are hard, take a capful of Miralax at bedtime. You do not have to use Miralax on days you have good stools.  Continue pantoprazole for now.
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
## 2022-07-14 ENCOUNTER — Telehealth: Payer: Self-pay

## 2022-07-14 LAB — PREGNANCY, URINE: Preg Test, Ur: NEGATIVE

## 2022-07-14 NOTE — Telephone Encounter (Signed)
Transition Care Management Follow-up Telephone Call Date of discharge and from where: [facility] 07/09/22 How have you been since you were released from the hospital? "Today is the first good day pain control and feeling better" Any questions or concerns? Yes do I keep my appt with pcp;answer:yes  Items Reviewed: Did the pt receive and understand the discharge instructions provided? Yes  Medications obtained and verified? Yes  Other? No  Any new allergies since your discharge? No  Dietary orders reviewed? Yes Do you have support at home? Yes    Functional Questionnaire: (I = Independent and D = Dependent) ADLs: I  Bathing/Dressing- I  Meal Prep- I  Eating- I  Maintaining continence- I  Transferring/Ambulation- I  Managing Meds- I  Follow up appointments reviewed:  PCP Hospital f/u appt confirmed? Yes  Scheduled to see Dr Moshe Cipro on 07/17/22 @ 3:40pm. [facility] f/u appt confirmed? Yes  Scheduled to see Pre-op on 07/22/22 @ 1000. Are transportation arrangements needed? No  If their condition worsens, is the pt aware to call PCP or go to the Emergency Dept.? Yes Was the patient provided with contact information for the PCP's office or ED? Yes Was to pt encouraged to call back with questions or concerns? Yes

## 2022-07-16 ENCOUNTER — Ambulatory Visit (HOSPITAL_COMMUNITY)
Admission: RE | Admit: 2022-07-16 | Discharge: 2022-07-16 | Disposition: A | Discharge status: Home / Self Care | Payer: BC Managed Care – PPO | Source: Ambulatory Visit | Attending: Family Medicine | Admitting: Family Medicine

## 2022-07-16 ENCOUNTER — Ambulatory Visit (HOSPITAL_COMMUNITY): Payer: BC Managed Care – PPO

## 2022-07-16 DIAGNOSIS — R109 Unspecified abdominal pain: Secondary | ICD-10-CM | POA: Diagnosis present

## 2022-07-17 ENCOUNTER — Ambulatory Visit: Payer: BC Managed Care – PPO | Admitting: Family Medicine

## 2022-07-17 ENCOUNTER — Other Ambulatory Visit: Payer: Self-pay

## 2022-07-17 DIAGNOSIS — K8020 Calculus of gallbladder without cholecystitis without obstruction: Secondary | ICD-10-CM

## 2022-07-17 DIAGNOSIS — R109 Unspecified abdominal pain: Secondary | ICD-10-CM

## 2022-07-20 ENCOUNTER — Ambulatory Visit: Payer: BC Managed Care – PPO | Admitting: Gastroenterology

## 2022-07-22 ENCOUNTER — Encounter (HOSPITAL_COMMUNITY)
Admission: RE | Admit: 2022-07-22 | Discharge: 2022-07-22 | Disposition: A | Discharge status: Home / Self Care | Payer: BC Managed Care – PPO | Source: Ambulatory Visit | Attending: Internal Medicine | Admitting: Internal Medicine

## 2022-07-22 ENCOUNTER — Encounter (HOSPITAL_COMMUNITY): Payer: Self-pay

## 2022-07-22 ENCOUNTER — Encounter: Payer: Self-pay | Admitting: Family Medicine

## 2022-07-22 ENCOUNTER — Ambulatory Visit: Payer: BC Managed Care – PPO | Admitting: Family Medicine

## 2022-07-22 ENCOUNTER — Other Ambulatory Visit: Payer: Self-pay

## 2022-07-22 VITALS — Ht 67.0 in | Wt 238.8 lb

## 2022-07-22 VITALS — BP 115/76 | HR 87 | Ht 67.0 in | Wt 236.0 lb

## 2022-07-22 DIAGNOSIS — M79672 Pain in left foot: Secondary | ICD-10-CM | POA: Insufficient documentation

## 2022-07-22 DIAGNOSIS — Z01818 Encounter for other preprocedural examination: Secondary | ICD-10-CM

## 2022-07-22 DIAGNOSIS — K8020 Calculus of gallbladder without cholecystitis without obstruction: Secondary | ICD-10-CM | POA: Diagnosis not present

## 2022-07-22 HISTORY — DX: Gastro-esophageal reflux disease without esophagitis: K21.9

## 2022-07-22 NOTE — Patient Instructions (Signed)
F/U in 4 months, call ifm you need me sooner  Weight loss goal of 12 to 16 pounds is reasonable  You are referred to Ortho re left ankle injury, we will call/ message with appt info  Continue calorie counting   All the best with GI and Gyne management  Thanks for choosing [facility], we consider it a privelige to serve you.
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
## 2022-07-23 ENCOUNTER — Ambulatory Visit (INDEPENDENT_AMBULATORY_CARE_PROVIDER_SITE_OTHER): Payer: BC Managed Care – PPO | Admitting: Orthopedic Surgery

## 2022-07-23 ENCOUNTER — Encounter: Payer: Self-pay | Admitting: Orthopedic Surgery

## 2022-07-23 VITALS — Ht 67.0 in | Wt 230.0 lb

## 2022-07-23 DIAGNOSIS — S93492A Sprain of other ligament of left ankle, initial encounter: Secondary | ICD-10-CM
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
## 2022-07-23 NOTE — Patient Instructions (Signed)
It is okay to use ice on the ankle 30 minutes at a time or do contrast baths 5 minutes in cold water 5 minutes in warm water until 30 minutes or up  Use crutches for support until you feel comfortable without  Start alphabet exercises

## 2022-07-24 ENCOUNTER — Ambulatory Visit (HOSPITAL_COMMUNITY): Payer: BC Managed Care – PPO | Admitting: Anesthesiology

## 2022-07-24 ENCOUNTER — Encounter (HOSPITAL_COMMUNITY)
Admission: RE | Disposition: A | Discharge status: Home / Self Care | Payer: Self-pay | Source: Home / Self Care | Attending: Internal Medicine

## 2022-07-24 ENCOUNTER — Encounter (HOSPITAL_COMMUNITY): Payer: Self-pay | Admitting: Internal Medicine

## 2022-07-24 ENCOUNTER — Ambulatory Visit (HOSPITAL_COMMUNITY)
Admission: RE | Admit: 2022-07-24 | Discharge: 2022-07-24 | Disposition: A | Discharge status: Home / Self Care | Payer: BC Managed Care – PPO | Attending: Internal Medicine | Admitting: Internal Medicine

## 2022-07-24 DIAGNOSIS — R1011 Right upper quadrant pain: Secondary | ICD-10-CM | POA: Diagnosis not present

## 2022-07-24 DIAGNOSIS — K589 Irritable bowel syndrome without diarrhea: Secondary | ICD-10-CM | POA: Insufficient documentation

## 2022-07-24 DIAGNOSIS — E669 Obesity, unspecified: Secondary | ICD-10-CM | POA: Insufficient documentation

## 2022-07-24 DIAGNOSIS — Z6837 Body mass index (BMI) 37.0-37.9, adult: Secondary | ICD-10-CM | POA: Diagnosis not present

## 2022-07-24 DIAGNOSIS — E119 Type 2 diabetes mellitus without complications: Secondary | ICD-10-CM | POA: Diagnosis not present

## 2022-07-24 DIAGNOSIS — K640 First degree hemorrhoids: Secondary | ICD-10-CM | POA: Insufficient documentation

## 2022-07-24 DIAGNOSIS — K5730 Diverticulosis of large intestine without perforation or abscess without bleeding: Secondary | ICD-10-CM | POA: Insufficient documentation

## 2022-07-24 DIAGNOSIS — Z79899 Other long term (current) drug therapy: Secondary | ICD-10-CM | POA: Diagnosis not present

## 2022-07-24 DIAGNOSIS — K921 Melena: Secondary | ICD-10-CM

## 2022-07-24 DIAGNOSIS — Z01818 Encounter for other preprocedural examination: Secondary | ICD-10-CM

## 2022-07-24 HISTORY — PX: COLONOSCOPY WITH PROPOFOL: SHX5780

## 2022-07-24 HISTORY — PX: ESOPHAGOGASTRODUODENOSCOPY (EGD) WITH PROPOFOL: SHX5813

## 2022-07-24 LAB — POCT PREGNANCY, URINE: Preg Test, Ur: NEGATIVE

## 2022-07-24 SURGERY — COLONOSCOPY WITH PROPOFOL
Anesthesia: General

## 2022-07-24 MED ORDER — LIDOCAINE HCL (CARDIAC) PF 50 MG/5ML IV SOSY
PREFILLED_SYRINGE | INTRAVENOUS | Status: DC | PRN
  Administered 2022-07-24: 100 mg via INTRAVENOUS

## 2022-07-24 MED ORDER — LACTATED RINGERS IV SOLN: INTRAVENOUS | Status: DC

## 2022-07-24 MED ORDER — LIDOCAINE HCL (PF) 2 % IJ SOLN
INTRAMUSCULAR | Status: AC
  Filled 2022-07-24: qty 5

## 2022-07-24 MED ORDER — PROPOFOL 500 MG/50ML IV EMUL
INTRAVENOUS | Status: DC | PRN
  Administered 2022-07-24: 150 ug/kg/min via INTRAVENOUS
  Administered 2022-07-24: 125 ug/kg/min via INTRAVENOUS

## 2022-07-24 MED ORDER — PROPOFOL 10 MG/ML IV BOLUS
INTRAVENOUS | Status: DC | PRN
  Administered 2022-07-24: 20 mg via INTRAVENOUS
  Administered 2022-07-24: 100 mg via INTRAVENOUS
  Administered 2022-07-24: 150 mg via INTRAVENOUS
  Administered 2022-07-24: 100 mg via INTRAVENOUS

## 2022-07-24 NOTE — Anesthesia Preprocedure Evaluation (Signed)
Anesthesia Evaluation  Patient identified by MRN, date of birth, ID band Patient awake    Reviewed: Allergy & Precautions, H&P , NPO status , Patient's Chart, lab work & pertinent test results, reviewed documented beta blocker date and time   Airway Mallampati: II  TM Distance: >3 FB Neck ROM: full    Dental no notable dental hx.    Pulmonary neg pulmonary ROS   Pulmonary exam normal breath sounds clear to auscultation       Cardiovascular Exercise Tolerance: Good negative cardio ROS  Rhythm:regular Rate:Normal     Neuro/Psych  Headaches PSYCHIATRIC DISORDERS Anxiety      Neuromuscular disease negative neurological ROS  negative psych ROS   GI/Hepatic negative GI ROS, Neg liver ROS,GERD  ,,  Endo/Other  negative endocrine ROSdiabetes    Renal/GU negative Renal ROS  negative genitourinary   Musculoskeletal   Abdominal   Peds  Hematology negative hematology ROS (+) Blood dyscrasia, anemia   Anesthesia Other Findings   Reproductive/Obstetrics negative OB ROS                             Anesthesia Physical Anesthesia Plan  ASA: 2  Anesthesia Plan: General   Post-op Pain Management:    Induction:   PONV Risk Score and Plan: Propofol infusion  Airway Management Planned:   Additional Equipment:   Intra-op Plan:   Post-operative Plan:   Informed Consent: I have reviewed the patients History and Physical, chart, labs and discussed the procedure including the risks, benefits and alternatives for the proposed anesthesia with the patient or authorized representative who has indicated his/her understanding and acceptance.     Dental Advisory Given  Plan Discussed with: CRNA  Anesthesia Plan Comments:        Anesthesia Quick Evaluation

## 2022-07-24 NOTE — Transfer of Care (Signed)
Immediate Anesthesia Transfer of Care Note  Patient: Yvette Conley  Procedure(s) Performed: COLONOSCOPY WITH PROPOFOL ESOPHAGOGASTRODUODENOSCOPY (EGD) WITH PROPOFOL  Patient Location: Short Stay  Anesthesia Type:General  Level of Consciousness: awake and patient cooperative  Airway & Oxygen Therapy: Patient Spontanous Breathing  Post-op Assessment: Report given to RN and Post -op Vital signs reviewed and stable  Post vital signs: Reviewed and stable  Last Vitals:  Vitals Value Taken Time  BP 105/66 07/24/22 1127  Temp 36.8 C 07/24/22 1127  Pulse 91 07/24/22 1127  Resp 16 07/24/22 1127  SpO2 100 % 07/24/22 1127    Last Pain:  Vitals:   07/24/22 1127  TempSrc: Oral  PainSc: 0-No pain         Complications: No notable events documented.

## 2022-07-24 NOTE — Interval H&P Note (Signed)
History and Physical Interval Note:  07/24/2022 10:38 AM  Yvette Conley  has presented today for surgery, with the diagnosis of ight lower quadarant pain,right upper quadrant pain, rectal bleeding,change in bowels.  The various methods of treatment have been discussed with the patient and family. After consideration of risks, benefits and other options for treatment, the patient has consented to  Procedure(s) with comments: COLONOSCOPY WITH PROPOFOL (N/A) - 1:30 pm  ASA 2, pt knows to arrive at 8:30 ESOPHAGOGASTRODUODENOSCOPY (EGD) WITH PROPOFOL (N/A) as a surgical intervention.  The patient's history has been reviewed, patient examined, no change in status, stable for surgery.  I have reviewed the patient's chart and labs.  Questions were answered to the patient's satisfaction.     Manus Rudd     patient seen and examined.  No change.  No dysphagia.  Diagnostic EGD and ileocolonoscopy per plan. The risks, benefits, limitations, imponderables and alternatives regarding both EGD and colonoscopy have been reviewed with the patient. Questions have been answered. All parties agreeable.

## 2022-07-24 NOTE — Op Note (Signed)
[facility] Patient Name: Yvette Conley Procedure Date: 07/24/2022 10:36 AM MRN: [id number] Date of Birth: [birth year] Attending MD: Norvel Richards , MD, [id number] CSN: [id number] Age: 37 Admit Type: Outpatient Procedure:                Upper GI endoscopy Indications:              Abdominal pain in the right upper quadrant,                            Abdominal pain in the right lower quadrant Providers:                Norvel Richards, MD, Lurline Del, RN, Illene Labrador Referring MD:              Medicines:                Propofol per Anesthesia Complications:            No immediate complications. Estimated Blood Loss:     Estimated blood loss: none. Procedure:                Pre-Anesthesia Assessment:                           - Prior to the procedure, a History and Physical                            was performed, and patient medications and                            allergies were reviewed. The patient's tolerance of                            previous anesthesia was also reviewed. The risks                            and benefits of the procedure and the sedation                            options and risks were discussed with the patient.                            All questions were answered, and informed consent                            was obtained. Prior Anticoagulants: The patient has                            taken no anticoagulant or antiplatelet agents. ASA                            Grade Assessment: II - A patient with mild systemic  disease. After reviewing the risks and benefits,                            the patient was deemed in satisfactory condition to                            undergo the procedure.                           After obtaining informed consent, the endoscope was                            passed under direct vision. Throughout the                            procedure, the  patient's blood pressure, pulse, and                            oxygen saturations were monitored continuously. The                            [id number] scope was introduced through the                            mouth, and advanced to the second part of duodenum.                            The upper GI endoscopy was accomplished without                            difficulty. The patient tolerated the procedure                            well. Scope In: 10:57:07 AM Scope Out: 11:01:26 AM Total Procedure Duration: 0 hours 4 minutes 19 seconds  Findings:      The examined esophagus was normal. Gastric cavity empty.      The entire examined stomach was normal. Pylorus patent.      The duodenal bulb and second portion of the duodenum were normal. Impression:               - Normal esophagus.                           - Normal stomach.                           - Normal duodenal bulb and second portion of the                            duodenum.                           - No specimens collected. Moderate Sedation:      Moderate (conscious) sedation was personally administered by an       anesthesia professional. The following parameters were monitored:  oxygen       saturation, heart rate, blood pressure, respiratory rate, EKG, adequacy       of pulmonary ventilation, and response to care. Recommendation:           - Patient has a contact number available for                            emergencies. The signs and symptoms of potential                            delayed complications were discussed with the                            patient. Return to normal activities tomorrow.                            Written discharge instructions were provided to the                            patient.                           - Advance diet as tolerated.                           - Continue present medications.                           - Return to my office (date not yet determined).                             See colonoscopy report. Procedure Code(s):        --- Professional ---                           [id number], Esophagogastroduodenoscopy, flexible,                            transoral; diagnostic, including collection of                            specimen(s) by brushing or washing, when performed                            (separate procedure) Diagnosis Code(s):        --- Professional ---                           R10.11, Right upper quadrant pain                           R10.31, Right lower quadrant pain CPT copyright 2022 American Medical Association. All rights reserved. The codes documented in this report are preliminary and upon coder review may  be revised to meet current compliance requirements. Yvette Conley. Yvette Santalucia, MD Norvel Richards, MD 07/24/2022 11:05:24 AM This report has been signed electronically. Number of Addenda: 0

## 2022-07-24 NOTE — Op Note (Signed)
[facility] Patient Name: Houa Kunkel Procedure Date: 07/24/2022 10:37 AM MRN: [id number] Date of Birth: July 24, 1985 Attending MD: Norvel Richards , MD, [id number] CSN: [id number] Age: 37 Admit Type: Outpatient Procedure:                Colonoscopy Indications:              Hematochezia Providers:                Norvel Richards, MD, Lurline Del, RN, Illene Labrador Referring MD:              Medicines:                Propofol per Anesthesia Complications:            No immediate complications. Estimated Blood Loss:     Estimated blood loss was minimal. Procedure:                Pre-Anesthesia Assessment:                           - Prior to the procedure, a History and Physical                            was performed, and patient medications and                            allergies were reviewed. The patient's tolerance of                            previous anesthesia was also reviewed. The risks                            and benefits of the procedure and the sedation                            options and risks were discussed with the patient.                            All questions were answered, and informed consent                            was obtained. Prior Anticoagulants: The patient has                            taken no anticoagulant or antiplatelet agents. ASA                            Grade Assessment: II - A patient with mild systemic                            disease. After reviewing the risks and benefits,  the patient was deemed in satisfactory condition to                            undergo the procedure.                           After obtaining informed consent, the colonoscope                            was passed under direct vision. Throughout the                            procedure, the patient's blood pressure, pulse, and                            oxygen saturations were monitored  continuously. The                            [id number]) scope was introduced through the                            anus and advanced to the 10 cm into the ileum. The                            entire colon was well visualized. Scope In: 11:07:39 AM Scope Out: 11:19:22 AM Scope Withdrawal Time: 0 hours 6 minutes 23 seconds  Total Procedure Duration: 0 hours 11 minutes 43 seconds  Findings:      The perianal and digital rectal examinations were normal.      Non-bleeding internal hemorrhoids were found during retroflexion. The       hemorrhoids were mild, small and Grade I (internal hemorrhoids that do       not prolapse).      The colon (entire examined portion) appeared normal. Impression:               - Non-bleeding internal hemorrhoids.                           - The entire examined colon is normal.                            Normal-appearing terminal ileum                           -No specimens collected Moderate Sedation:      Moderate (conscious) sedation was personally administered by an       anesthesia professional. The following parameters were monitored: oxygen       saturation, heart rate, blood pressure, respiratory rate, EKG, adequacy       of pulmonary ventilation, and response to care. Recommendation:           - Patient has a contact number available for                            emergencies. The signs and symptoms of potential  delayed complications were discussed with the                            patient. Return to normal activities tomorrow.                            Written discharge instructions were provided to the                            patient.                           - Advance diet as tolerated.                           - Repeat colonoscopy age 61 for screening purposes.                            No GI explanation for patient's right-sided pain.                            If GYN evaluation is ultimately  negative, may need                            to consider diagnostic laparoscopy with incidental                            appendectomy Procedure Code(s):        --- Professional ---                           [phone], Colonoscopy, flexible; diagnostic, including                            collection of specimen(s) by brushing or washing,                            when performed (separate procedure) Diagnosis Code(s):        --- Professional ---                           K64.0, First degree hemorrhoids                           K92.1, Melena (includes Hematochezia) CPT copyright 2022 American Medical Association. All rights reserved. The codes documented in this report are preliminary and upon coder review may  be revised to meet current compliance requirements. Cristopher Estimable. Maysel Mccolm, MD Norvel Richards, MD 07/24/2022 11:26:32 AM This report has been signed electronically. Number of Addenda: 0

## 2022-07-24 NOTE — Discharge Instructions (Addendum)
EGD Discharge instructions Please read the instructions outlined below and refer to this sheet in the next few weeks. These discharge instructions provide you with general information on caring for yourself after you leave the hospital. Your doctor may also give you specific instructions. While your treatment has been planned according to the most current medical practices available, unavoidable complications occasionally occur. If you have any problems or questions after discharge, please call your doctor. ACTIVITY You may resume your regular activity but move at a slower pace for the next 24 hours.  Take frequent rest periods for the next 24 hours.  Walking will help expel (get rid of) the air and reduce the bloated feeling in your abdomen.  No driving for 24 hours (because of the anesthesia (medicine) used during the test).  You may shower.  Do not sign any important legal documents or operate any machinery for 24 hours (because of the anesthesia used during the test).  NUTRITION Drink plenty of fluids.  You may resume your normal diet.  Begin with a light meal and progress to your normal diet.  Avoid alcoholic beverages for 24 hours or as instructed by your caregiver.  MEDICATIONS You may resume your normal medications unless your caregiver tells you otherwise.  WHAT YOU CAN EXPECT TODAY You may experience abdominal discomfort such as a feeling of fullness or "gas" pains.  FOLLOW-UP Your doctor will discuss the results of your test with you.  SEEK IMMEDIATE MEDICAL ATTENTION IF ANY OF THE FOLLOWING OCCUR: Excessive nausea (feeling sick to your stomach) and/or vomiting.  Severe abdominal pain and distention (swelling).  Trouble swallowing.  Temperature over 101 F (37.8 C).  Rectal bleeding or vomiting of blood.    Colonoscopy Discharge Instructions  Read the instructions outlined below and refer to this sheet in the next few weeks. These discharge instructions provide you with  general information on caring for yourself after you leave the hospital. Your doctor may also give you specific instructions. While your treatment has been planned according to the most current medical practices available, unavoidable complications occasionally occur. If you have any problems or questions after discharge, call Dr. Gala Romney at [phone]. ACTIVITY You may resume your regular activity, but move at a slower pace for the next 24 hours.  Take frequent rest periods for the next 24 hours.  Walking will help get rid of the air and reduce the bloated feeling in your belly (abdomen).  No driving for 24 hours (because of the medicine (anesthesia) used during the test).   Do not sign any important legal documents or operate any machinery for 24 hours (because of the anesthesia used during the test).  NUTRITION Drink plenty of fluids.  You may resume your normal diet as instructed by your doctor.  Begin with a light meal and progress to your normal diet. Heavy or fried foods are harder to digest and may make you feel sick to your stomach (nauseated).  Avoid alcoholic beverages for 24 hours or as instructed.  MEDICATIONS You may resume your normal medications unless your doctor tells you otherwise.  WHAT YOU CAN EXPECT TODAY Some feelings of bloating in the abdomen.  Passage of more gas than usual.  Spotting of blood in your stool or on the toilet paper.  IF YOU HAD POLYPS REMOVED DURING THE COLONOSCOPY: No aspirin products for 7 days or as instructed.  No alcohol for 7 days or as instructed.  Eat a soft diet for the next 24 hours.  FINDING  OUT THE RESULTS OF YOUR TEST Not all test results are available during your visit. If your test results are not back during the visit, make an appointment with your caregiver to find out the results. Do not assume everything is normal if you have not heard from your caregiver or the medical facility. It is important for you to follow up on all of your test  results.  SEEK IMMEDIATE MEDICAL ATTENTION IF: You have more than a spotting of blood in your stool.  Your belly is swollen (abdominal distention).  You are nauseated or vomiting.  You have a temperature over 101.  You have abdominal pain or discomfort that is severe or gets worse throughout the day.      your EGD was normal today  You have minimal hemorrhoids otherwise, your colon is normal.  No evidence of inflammatory bowel disease including Crohn's disease   information on hemorrhoids provided today   office visit with [place] in 3 months   at patient request, I called Oren Binet at [phone] rolled to [place] a message

## 2022-07-24 NOTE — Anesthesia Procedure Notes (Signed)
Date/Time: 07/24/2022 10:49 AM  Performed by: Vista Deck, CRNAPre-anesthesia Checklist: Patient identified, Emergency Drugs available, Suction available, Timeout performed and Patient being monitored Patient Re-evaluated:Patient Re-evaluated prior to induction Oxygen Delivery Method: Nasal Cannula

## 2022-07-27 DIAGNOSIS — K8020 Calculus of gallbladder without cholecystitis without obstruction: Secondary | ICD-10-CM | POA: Insufficient documentation

## 2022-07-27 HISTORY — DX: Calculus of gallbladder without cholecystitis without obstruction: K80.20

## 2022-07-27 NOTE — Progress Notes (Signed)
   Yvette Conley     MRN: [id number]      DOB: [birth year]   HPI Yvette Conley is here for follow up from recent urgent care visit when she was seen for sprain of left ankle when she missed a pothole in the dark and stepped into it on 07/18/2022 Also for review of [place] does report recurrent upper abdominal pain and bloating, has upper and lower endo scheduled this week Gyne also evaluating and managing thickened endometrium ROS Denies recent fever or chills. Denies sinus pressure, nasal congestion, ear pain or sore throat. Denies chest congestion, productive cough or wheezing. Denies chest pains, palpitations and leg swelling Denies headaches, seizures, numbness, or tingling. Denies depression, anxiety or insomnia. Denies skin break down or rash.   PE  BP 115/76 (BP Location: Right Arm, Patient Position: Sitting, Cuff Size: Large)   Pulse 87   Ht 5\' 7"  (1.702 m)   Wt 236 lb 0.6 oz (107.1 kg)   LMP 06/26/2022 (Exact Date) Comment: Hcg point of care test - negative  SpO2 94%   BMI 36.97 kg/m   Patient alert and oriented and in no cardiopulmonary distress.Ambulating with crutches  HEENT: No facial asymmetry, EOMI,     Neck supple .  Chest: Clear to auscultation bilaterally.  CVS: S1, S2 no murmurs, no S3.Regular rate.  MS: Adequate ROM spine, shoulders, hips and knees.Decreased ROM left ankle , tendr over lateral and medical malleoli also over achilees tendon  Skin: Intact, no ulcerations or rash noted.  Psych: Good eye contact, normal affect. Memory intact not anxious or depressed appearing.  CNS: CN 2-12 intact, power,  normal throughout.no focal deficits noted.   Assessment & Plan  Acute foot pain, left Twisted left ankle in pothole that she missed seeing on 07/18/3022 [facility] [place], quick care x ray neg  has crutches, tender achilees tender on medial ankle, sprain refer Ortho for management  Cholelithiasis Symptomatic with recurrent bloating and pain in RUQ, qill  compkete endoscopy his week, I recommend cholecystectomy based on long h/o associated symptoms and multiple stones reported  Morbid obesity ([place])  Patient re-educated about  the importance of commitment to a  minimum of 150 minutes of exercise per week as able.  The importance of healthy food choices with portion control discussed, as well as eating regularly and within a 12 hour window most days. The need to choose "clean , green" food 50 to 75% of the time is discussed, as well as to make water the primary drink and set a goal of 64 ounces water daily.       07/24/2022   10:18 AM 07/23/2022    4:10 PM 07/22/2022    2:51 PM  Weight /BMI  Weight 229 lb 15 oz 230 lb 236 lb 0.6 oz  Height 5\' 7"  (1.702 m) 5\' 7"  (1.702 m) 5\' 7"  (1.702 m)  BMI 36.01 kg/m2 36.02 kg/m2 36.97 kg/m2    Unchanged

## 2022-07-27 NOTE — Assessment & Plan Note (Signed)
  Patient re-educated about  the importance of commitment to a  minimum of 150 minutes of exercise per week as able.  The importance of healthy food choices with portion control discussed, as well as eating regularly and within a 12 hour window most days. The need to choose "clean , green" food 50 to 75% of the time is discussed, as well as to make water the primary drink and set a goal of 64 ounces water daily.       07/24/2022   10:18 AM 07/23/2022    4:10 PM 07/22/2022    2:51 PM  Weight /BMI  Weight 229 lb 15 oz 230 lb 236 lb 0.6 oz  Height 5\' 7"  (1.702 m) 5\' 7"  (1.702 m) 5\' 7"  (1.702 m)  BMI 36.01 kg/m2 36.02 kg/m2 36.97 kg/m2    Unchanged

## 2022-07-27 NOTE — Assessment & Plan Note (Signed)
Symptomatic with recurrent bloating and pain in RUQ, qill compkete endoscopy his week, I recommend cholecystectomy based on long h/o associated symptoms and multiple stones reported

## 2022-07-28 NOTE — Anesthesia Postprocedure Evaluation (Signed)
Anesthesia Post Note  Patient: Yvette Conley  Procedure(s) Performed: COLONOSCOPY WITH PROPOFOL ESOPHAGOGASTRODUODENOSCOPY (EGD) WITH PROPOFOL  Patient location during evaluation: Phase II Anesthesia Type: General Level of consciousness: awake Pain management: pain level controlled Vital Signs Assessment: post-procedure vital signs reviewed and stable Respiratory status: spontaneous breathing and respiratory function stable Cardiovascular status: blood pressure returned to baseline and stable Postop Assessment: no headache and no apparent nausea or vomiting Anesthetic complications: no Comments: Late entry   No notable events documented.   Last Vitals:  Vitals:   07/24/22 1018 07/24/22 1127  BP: 117/77 105/66  Pulse: 80 91  Resp: 18 16  Temp: 37 C 36.8 C  SpO2: 100% 100%    Last Pain:  Vitals:   07/27/22 1432  TempSrc:   PainSc: 0-No pain                 Louann Sjogren

## 2022-07-30 ENCOUNTER — Encounter: Payer: Self-pay | Admitting: Surgery

## 2022-07-30 ENCOUNTER — Ambulatory Visit: Payer: BC Managed Care – PPO | Admitting: Surgery

## 2022-07-30 VITALS — BP 118/79 | HR 100 | Temp 98.7°F | Resp 14 | Ht 67.0 in | Wt 228.0 lb

## 2022-07-30 DIAGNOSIS — K8020 Calculus of gallbladder without cholecystitis without obstruction: Secondary | ICD-10-CM
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
## 2022-07-31 ENCOUNTER — Ambulatory Visit: Payer: BC Managed Care – PPO | Admitting: Family Medicine

## 2022-07-31 ENCOUNTER — Encounter (HOSPITAL_COMMUNITY): Payer: Self-pay | Admitting: Internal Medicine
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
## 2022-08-04 ENCOUNTER — Ambulatory Visit: Payer: BC Managed Care – PPO | Admitting: Gastroenterology

## 2022-08-10 ENCOUNTER — Other Ambulatory Visit: Payer: Self-pay | Admitting: Diagnostic Neuroimaging

## 2022-08-10 NOTE — Patient Instructions (Signed)
Yvette Conley  08/10/2022     '@PREFPERIOPPHARMACY'$ @   Your procedure is scheduled on  08/13/2022.   Report to [facility] at  [place].M.   Call this number if you have problems the morning of surgery:  [phone]  If you experience any cold or flu symptoms such as cough, fever, chills, shortness of breath, etc. between now and your scheduled surgery, please notify us at the above number.   Remember:  Do not eat or drink after midnight.      Take these medicines the morning of surgery with A SIP OF WATER                     flexeril(if needed), pantoprazole.    Do not wear jewelry, make-up or nail polish.  Do not wear lotions, powders, or perfumes, or deodorant.  Do not shave 48 hours prior to surgery.  Men may shave face and neck.  Do not bring valuables to the hospital.  [facility] is not responsible for any belongings or valuables.  Contacts, dentures or bridgework may not be worn into surgery.  Leave your suitcase in the car.  After surgery it may be brought to your room.  For patients admitted to the hospital, discharge time will be determined by your treatment team.  Patients discharged the day of surgery will not be allowed to drive home and must have someone with them for 24 hours.    Special instructions:   DO NOT smoke tobacco or vape for 24 hours before your procedure.  Please read over the following fact sheets that you were given. Coughing and Deep Breathing, Surgical Site Infection Prevention, Anesthesia Post-op Instructions, and Care and Recovery After Surgery      Minimally Invasive Cholecystectomy, Care After The following information offers guidance on how to care for yourself after your procedure. Your health care provider may also give you more specific instructions. If you have problems or questions, contact your health care provider. What can I expect after the procedure? After the procedure, it is common to have: Pain at your  incision sites. You will be given medicines to control this pain. Mild nausea or vomiting. Bloating and possible shoulder pain from the gas that was used during the procedure. Follow these instructions at home: Medicines Take over-the-counter and prescription medicines only as told by your health care provider. If you were prescribed an antibiotic medicine, take it as told by your health care provider. Do not stop using the antibiotic even if you start to feel better. Ask your health care provider if the medicine prescribed to you: Requires you to avoid driving or using machinery. Can cause constipation. You may need to take these actions to prevent or treat constipation: Drink enough fluid to keep your urine pale yellow. Take over-the-counter or prescription medicines. Eat foods that are high in fiber, such as beans, whole grains, and fresh fruits and vegetables. Limit foods that are high in fat and processed sugars, such as fried or sweet foods. Incision care  Follow instructions from your health care provider about how to take care of your incisions. Make sure you: Wash your hands with soap and water for at least 20 seconds before and after you change your bandage (dressing). If soap and water are not available, use hand sanitizer. Change your dressing as told by your health care provider. Leave stitches (sutures), skin glue, or adhesive strips in place. These  skin closures may need to be in place for 2 weeks or longer. If adhesive strip edges start to loosen and curl up, you may trim the loose edges. Do not remove adhesive strips completely unless your health care provider tells you to do that. Do not take baths, swim, or use a hot tub until your health care provider approves. Ask your health care provider if you may take showers. You may only be allowed to take sponge baths. Check your incision area every day for signs of infection. Check for: More redness, swelling, or pain. Fluid or  blood. Warmth. Pus or a bad smell. Activity Rest as told by your health care provider. Do not do activities that require a lot of effort. Avoid sitting for a long time without moving. Get up to take short walks every 1-2 hours. This is important to improve blood flow and breathing. Ask for help if you feel weak or unsteady. Do not lift anything that is heavier than 10 lb (4.5 kg), or the limit that you are told, until your health care provider says that it is safe. Do not play contact sports until your health care provider approves. Do not return to work or school until your health care provider approves. Return to your normal activities as told by your health care provider. Ask your health care provider what activities are safe for you. General instructions If you were given a sedative during the procedure, it can affect you for several hours. Do not drive or operate machinery until your health care provider says that it is safe. Keep all follow-up visits. This is important. Contact a health care provider if: You develop a rash. You have more redness, swelling, or pain around your incisions. You have fluid or blood coming from your incisions. Your incisions feel warm to the touch. You have pus or a bad smell coming from your incisions. You have a fever. One or more of your incisions breaks open. Get help right away if: You have trouble breathing. You have chest pain. You have more pain in your shoulders. You faint or feel dizzy when you stand. You have severe pain in your abdomen. You have nausea or vomiting that lasts for more than one day. You have leg pain that is new or unusual, or if it is localized to one specific spot. These symptoms may represent a serious problem that is an emergency. Do not wait to see if the symptoms will go away. Get medical help right away. Call your local emergency services (911 in the [place] Do not drive yourself to the hospital. Summary After your  procedure, it is common to have pain at the incision sites. You may also have nausea or bloating. Follow your health care provider's instructions about medicine, activity restrictions, and caring for your incision areas. Do not do activities that require a lot of effort. Contact a health care provider if you have a fever or other signs of infection, such as more redness, swelling, or pain around the incisions. Get help right away if you have chest pain, increasing pain in the shoulders, or trouble breathing. This information is not intended to replace advice given to you by your health care provider. Make sure you discuss any questions you have with your health care provider. Document Revised: 12/03/2020 Document Reviewed: 12/03/2020 Elsevier Patient Education  [place] Anesthesia, Adult, Care After The following information offers guidance on how to care for yourself after your procedure. Your health care provider  may also give you more specific instructions. If you have problems or questions, contact your health care provider. What can I expect after the procedure? After the procedure, it is common for people to: Have pain or discomfort at the IV site. Have nausea or vomiting. Have a sore throat or hoarseness. Have trouble concentrating. Feel cold or chills. Feel weak, sleepy, or tired (fatigue). Have soreness and body aches. These can affect parts of the body that were not involved in surgery. Follow these instructions at home: For the time period you were told by your health care provider:  Rest. Do not participate in activities where you could fall or become injured. Do not drive or use machinery. Do not drink alcohol. Do not take sleeping pills or medicines that cause drowsiness. Do not make important decisions or sign legal documents. Do not take care of children on your own. General instructions Drink enough fluid to keep your urine pale yellow. If you have  sleep apnea, surgery and certain medicines can increase your risk for breathing problems. Follow instructions from your health care provider about wearing your sleep device: Anytime you are sleeping, including during daytime naps. While taking prescription pain medicines, sleeping medicines, or medicines that make you drowsy. Return to your normal activities as told by your health care provider. Ask your health care provider what activities are safe for you. Take over-the-counter and prescription medicines only as told by your health care provider. Do not use any products that contain nicotine or tobacco. These products include cigarettes, chewing tobacco, and vaping devices, such as e-cigarettes. These can delay incision healing after surgery. If you need help quitting, ask your health care provider. Contact a health care provider if: You have nausea or vomiting that does not get better with medicine. You vomit every time you eat or drink. You have pain that does not get better with medicine. You cannot urinate or have bloody urine. You develop a skin rash. You have a fever. Get help right away if: You have trouble breathing. You have chest pain. You vomit blood. These symptoms may be an emergency. Get help right away. Call 911. Do not wait to see if the symptoms will go away. Do not drive yourself to the hospital. Summary After the procedure, it is common to have a sore throat, hoarseness, nausea, vomiting, or to feel weak, sleepy, or fatigue. For the time period you were told by your health care provider, do not drive or use machinery. Get help right away if you have difficulty breathing, have chest pain, or vomit blood. These symptoms may be an emergency. This information is not intended to replace advice given to you by your health care provider. Make sure you discuss any questions you have with your health care provider. Document Revised: 08/29/2021 Document Reviewed:  08/29/2021 Elsevier Patient Education  [place]. How to Use Chlorhexidine Before Surgery Chlorhexidine gluconate (CHG) is a germ-killing (antiseptic) solution that is used to clean the skin. It can get rid of the bacteria that normally live on the skin and can keep them away for about 24 hours. To clean your skin with CHG, you may be given: A CHG solution to use in the shower or as part of a sponge bath. A prepackaged cloth that contains CHG. Cleaning your skin with CHG may help lower the risk for infection: While you are staying in the intensive care unit of the hospital. If you have a vascular access, such as a central line, to  provide short-term or long-term access to your veins. If you have a catheter to drain urine from your bladder. If you are on a ventilator. A ventilator is a machine that helps you breathe by moving air in and out of your lungs. After surgery. What are the risks? Risks of using CHG include: A skin reaction. Hearing loss, if CHG gets in your ears and you have a perforated eardrum. Eye injury, if CHG gets in your eyes and is not rinsed out. The CHG product catching fire. Make sure that you avoid smoking and flames after applying CHG to your skin. Do not use CHG: If you have a chlorhexidine allergy or have previously reacted to chlorhexidine. On babies younger than 77 months of age. How to use CHG solution Use CHG only as told by your health care provider, and follow the instructions on the label. Use the full amount of CHG as directed. Usually, this is one bottle. During a shower Follow these steps when using CHG solution during a shower (unless your health care provider gives you different instructions): Start the shower. Use your normal soap and shampoo to wash your face and hair. Turn off the shower or move out of the shower stream. Pour the CHG onto a clean washcloth. Do not use any type of brush or rough-edged sponge. Starting at your neck, lather  your body down to your toes. Make sure you follow these instructions: If you will be having surgery, pay special attention to the part of your body where you will be having surgery. Scrub this area for at least 1 minute. Do not use CHG on your head or face. If the solution gets into your ears or eyes, rinse them well with water. Avoid your genital area. Avoid any areas of skin that have broken skin, cuts, or scrapes. Scrub your back and under your arms. Make sure to wash skin folds. Let the lather sit on your skin for 1-2 minutes or as long as told by your health care provider. Thoroughly rinse your entire body in the shower. Make sure that all body creases and crevices are rinsed well. Dry off with a clean towel. Do not put any substances on your body afterward--such as powder, lotion, or perfume--unless you are told to do so by your health care provider. Only use lotions that are recommended by the manufacturer. Put on clean clothes or pajamas. If it is the night before your surgery, sleep in clean sheets.  During a sponge bath Follow these steps when using CHG solution during a sponge bath (unless your health care provider gives you different instructions): Use your normal soap and shampoo to wash your face and hair. Pour the CHG onto a clean washcloth. Starting at your neck, lather your body down to your toes. Make sure you follow these instructions: If you will be having surgery, pay special attention to the part of your body where you will be having surgery. Scrub this area for at least 1 minute. Do not use CHG on your head or face. If the solution gets into your ears or eyes, rinse them well with water. Avoid your genital area. Avoid any areas of skin that have broken skin, cuts, or scrapes. Scrub your back and under your arms. Make sure to wash skin folds. Let the lather sit on your skin for 1-2 minutes or as long as told by your health care provider. Using a different clean, wet  washcloth, thoroughly rinse your entire body. Make sure that  all body creases and crevices are rinsed well. Dry off with a clean towel. Do not put any substances on your body afterward--such as powder, lotion, or perfume--unless you are told to do so by your health care provider. Only use lotions that are recommended by the manufacturer. Put on clean clothes or pajamas. If it is the night before your surgery, sleep in clean sheets. How to use CHG prepackaged cloths Only use CHG cloths as told by your health care provider, and follow the instructions on the label. Use the CHG cloth on clean, dry skin. Do not use the CHG cloth on your head or face unless your health care provider tells you to. When washing with the CHG cloth: Avoid your genital area. Avoid any areas of skin that have broken skin, cuts, or scrapes. Before surgery Follow these steps when using a CHG cloth to clean before surgery (unless your health care provider gives you different instructions): Using the CHG cloth, vigorously scrub the part of your body where you will be having surgery. Scrub using a back-and-forth motion for 3 minutes. The area on your body should be completely wet with CHG when you are done scrubbing. Do not rinse. Discard the cloth and let the area air-dry. Do not put any substances on the area afterward, such as powder, lotion, or perfume. Put on clean clothes or pajamas. If it is the night before your surgery, sleep in clean sheets.  For general bathing Follow these steps when using CHG cloths for general bathing (unless your health care provider gives you different instructions). Use a separate CHG cloth for each area of your body. Make sure you wash between any folds of skin and between your fingers and toes. Wash your body in the following order, switching to a new cloth after each step: The front of your neck, shoulders, and chest. Both of your arms, under your arms, and your hands. Your stomach and  groin area, avoiding the genitals. Your right leg and foot. Your left leg and foot. The back of your neck, your back, and your buttocks. Do not rinse. Discard the cloth and let the area air-dry. Do not put any substances on your body afterward--such as powder, lotion, or perfume--unless you are told to do so by your health care provider. Only use lotions that are recommended by the manufacturer. Put on clean clothes or pajamas. Contact a health care provider if: Your skin gets irritated after scrubbing. You have questions about using your solution or cloth. You swallow any chlorhexidine. Call your local poison control center (1-901-444-0792 in the [place] Get help right away if: Your eyes itch badly, or they become very red or swollen. Your skin itches badly and is red or swollen. Your hearing changes. You have trouble seeing. You have swelling or tingling in your mouth or throat. You have trouble breathing. These symptoms may represent a serious problem that is an emergency. Do not wait to see if the symptoms will go away. Get medical help right away. Call your local emergency services (911 in the [place] Do not drive yourself to the hospital. Summary Chlorhexidine gluconate (CHG) is a germ-killing (antiseptic) solution that is used to clean the skin. Cleaning your skin with CHG may help to lower your risk for infection. You may be given CHG to use for bathing. It may be in a bottle or in a prepackaged cloth to use on your skin. Carefully follow your health care provider's instructions and the instructions on the product  label. Do not use CHG if you have a chlorhexidine allergy. Contact your health care provider if your skin gets irritated after scrubbing. This information is not intended to replace advice given to you by your health care provider. Make sure you discuss any questions you have with your health care provider. Document Revised: 09/29/2021 Document Reviewed: 08/12/2020 Elsevier  Patient Education  [place].

## 2022-08-11 ENCOUNTER — Encounter (HOSPITAL_COMMUNITY): Payer: Self-pay

## 2022-08-11 ENCOUNTER — Encounter (HOSPITAL_COMMUNITY)
Admission: RE | Admit: 2022-08-11 | Discharge: 2022-08-11 | Disposition: A | Discharge status: Home / Self Care | Payer: BC Managed Care – PPO | Source: Ambulatory Visit | Attending: Surgery | Admitting: Surgery

## 2022-08-11 DIAGNOSIS — K8020 Calculus of gallbladder without cholecystitis without obstruction: Secondary | ICD-10-CM | POA: Diagnosis present

## 2022-08-11 DIAGNOSIS — Z01818 Encounter for other preprocedural examination: Secondary | ICD-10-CM | POA: Insufficient documentation

## 2022-08-11 DIAGNOSIS — K589 Irritable bowel syndrome without diarrhea: Secondary | ICD-10-CM | POA: Diagnosis not present

## 2022-08-11 DIAGNOSIS — R519 Headache, unspecified: Secondary | ICD-10-CM | POA: Diagnosis not present

## 2022-08-11 DIAGNOSIS — D649 Anemia, unspecified: Secondary | ICD-10-CM | POA: Diagnosis not present

## 2022-08-11 DIAGNOSIS — K219 Gastro-esophageal reflux disease without esophagitis: Secondary | ICD-10-CM | POA: Diagnosis not present

## 2022-08-11 DIAGNOSIS — E119 Type 2 diabetes mellitus without complications: Secondary | ICD-10-CM | POA: Insufficient documentation

## 2022-08-11 DIAGNOSIS — Z6835 Body mass index (BMI) 35.0-35.9, adult: Secondary | ICD-10-CM | POA: Diagnosis not present

## 2022-08-11 DIAGNOSIS — K828 Other specified diseases of gallbladder: Secondary | ICD-10-CM | POA: Diagnosis not present

## 2022-08-11 DIAGNOSIS — M797 Fibromyalgia: Secondary | ICD-10-CM | POA: Diagnosis not present

## 2022-08-11 DIAGNOSIS — K8010 Calculus of gallbladder with chronic cholecystitis without obstruction: Secondary | ICD-10-CM | POA: Diagnosis not present

## 2022-08-11 LAB — POCT PREGNANCY, URINE: Preg Test, Ur: NEGATIVE

## 2022-08-13 ENCOUNTER — Ambulatory Visit (HOSPITAL_COMMUNITY): Payer: BC Managed Care – PPO | Admitting: Anesthesiology

## 2022-08-13 ENCOUNTER — Encounter (HOSPITAL_COMMUNITY): Payer: Self-pay | Admitting: Surgery

## 2022-08-13 ENCOUNTER — Ambulatory Visit (HOSPITAL_COMMUNITY)
Admission: RE | Admit: 2022-08-13 | Discharge: 2022-08-13 | Disposition: A | Discharge status: Home / Self Care | Payer: BC Managed Care – PPO | Attending: Surgery | Admitting: Surgery

## 2022-08-13 ENCOUNTER — Ambulatory Visit: Payer: BC Managed Care – PPO | Admitting: Orthopedic Surgery

## 2022-08-13 ENCOUNTER — Encounter: Payer: Self-pay | Admitting: Radiology

## 2022-08-13 ENCOUNTER — Encounter (HOSPITAL_COMMUNITY)
Admission: RE | Disposition: A | Discharge status: Home / Self Care | Payer: Self-pay | Source: Home / Self Care | Attending: Surgery

## 2022-08-13 ENCOUNTER — Other Ambulatory Visit: Payer: Self-pay

## 2022-08-13 DIAGNOSIS — Z6835 Body mass index (BMI) 35.0-35.9, adult: Secondary | ICD-10-CM | POA: Insufficient documentation

## 2022-08-13 DIAGNOSIS — K8020 Calculus of gallbladder without cholecystitis without obstruction: Secondary | ICD-10-CM | POA: Diagnosis not present

## 2022-08-13 DIAGNOSIS — K219 Gastro-esophageal reflux disease without esophagitis: Secondary | ICD-10-CM | POA: Insufficient documentation

## 2022-08-13 DIAGNOSIS — K828 Other specified diseases of gallbladder: Secondary | ICD-10-CM | POA: Insufficient documentation

## 2022-08-13 DIAGNOSIS — K589 Irritable bowel syndrome without diarrhea: Secondary | ICD-10-CM | POA: Insufficient documentation

## 2022-08-13 DIAGNOSIS — E669 Obesity, unspecified: Secondary | ICD-10-CM | POA: Insufficient documentation

## 2022-08-13 DIAGNOSIS — R519 Headache, unspecified: Secondary | ICD-10-CM | POA: Insufficient documentation

## 2022-08-13 DIAGNOSIS — K8010 Calculus of gallbladder with chronic cholecystitis without obstruction: Secondary | ICD-10-CM | POA: Insufficient documentation

## 2022-08-13 DIAGNOSIS — E119 Type 2 diabetes mellitus without complications: Secondary | ICD-10-CM | POA: Insufficient documentation

## 2022-08-13 DIAGNOSIS — D649 Anemia, unspecified: Secondary | ICD-10-CM | POA: Insufficient documentation

## 2022-08-13 DIAGNOSIS — M797 Fibromyalgia: Secondary | ICD-10-CM | POA: Insufficient documentation

## 2022-08-13 LAB — HEMOGLOBIN A1C
Hgb A1c MFr Bld: 5.8 % — ABNORMAL HIGH (ref 4.8–5.6)
Mean Plasma Glucose: 120 mg/dL

## 2022-08-13 SURGERY — CHOLECYSTECTOMY, ROBOT-ASSISTED, LAPAROSCOPIC
Anesthesia: General | Site: Abdomen

## 2022-08-13 MED ORDER — INDOCYANINE GREEN 25 MG IV SOLR
2.5000 mg | Freq: Once | INTRAVENOUS | Status: AC
  Administered 2022-08-13: 2.5 mg via INTRAVENOUS

## 2022-08-13 MED ORDER — DEXAMETHASONE SODIUM PHOSPHATE 10 MG/ML IJ SOLN
INTRAMUSCULAR | Status: DC | PRN
  Administered 2022-08-13: 10 mg via INTRAVENOUS

## 2022-08-13 MED ORDER — ACETAMINOPHEN 500 MG PO TABS: 1000.0000 mg | ORAL_TABLET | Freq: Four times a day (QID) | ORAL | 0 refills | Status: AC

## 2022-08-13 MED ORDER — CLINDAMYCIN PHOSPHATE 900 MG/50ML IV SOLN
INTRAVENOUS | Status: AC
  Filled 2022-08-13: qty 50

## 2022-08-13 MED ORDER — ONDANSETRON HCL 4 MG/2ML IJ SOLN
INTRAMUSCULAR | Status: AC
  Filled 2022-08-13: qty 2

## 2022-08-13 MED ORDER — SUCCINYLCHOLINE CHLORIDE 200 MG/10ML IV SOSY
PREFILLED_SYRINGE | INTRAVENOUS | Status: AC
  Filled 2022-08-13: qty 10

## 2022-08-13 MED ORDER — ROCURONIUM BROMIDE 10 MG/ML (PF) SYRINGE
PREFILLED_SYRINGE | INTRAVENOUS | Status: DC | PRN
  Administered 2022-08-13: 10 mg via INTRAVENOUS
  Administered 2022-08-13: 80 mg via INTRAVENOUS

## 2022-08-13 MED ORDER — PROPOFOL 10 MG/ML IV BOLUS
INTRAVENOUS | Status: DC | PRN
  Administered 2022-08-13: 200 mg via INTRAVENOUS

## 2022-08-13 MED ORDER — ROCURONIUM BROMIDE 10 MG/ML (PF) SYRINGE
PREFILLED_SYRINGE | INTRAVENOUS | Status: AC
  Filled 2022-08-13: qty 10

## 2022-08-13 MED ORDER — LACTATED RINGERS IV SOLN: INTRAVENOUS | Status: DC

## 2022-08-13 MED ORDER — CHLORHEXIDINE GLUCONATE 0.12 % MT SOLN
15.0000 mL | Freq: Once | OROMUCOSAL | Status: AC
  Administered 2022-08-13: 15 mL via OROMUCOSAL

## 2022-08-13 MED ORDER — CHLORHEXIDINE GLUCONATE CLOTH 2 % EX PADS: 6.0000 | MEDICATED_PAD | Freq: Once | CUTANEOUS | Status: DC

## 2022-08-13 MED ORDER — EPHEDRINE 5 MG/ML INJ
INTRAVENOUS | Status: AC
  Filled 2022-08-13: qty 5

## 2022-08-13 MED ORDER — ORAL CARE MOUTH RINSE: 15.0000 mL | Freq: Once | OROMUCOSAL | Status: AC

## 2022-08-13 MED ORDER — ONDANSETRON HCL 4 MG/2ML IJ SOLN
INTRAMUSCULAR | Status: DC | PRN
  Administered 2022-08-13: 4 mg via INTRAVENOUS

## 2022-08-13 MED ORDER — FENTANYL CITRATE (PF) 250 MCG/5ML IJ SOLN
INTRAMUSCULAR | Status: AC
  Filled 2022-08-13: qty 5

## 2022-08-13 MED ORDER — FENTANYL CITRATE (PF) 250 MCG/5ML IJ SOLN
INTRAMUSCULAR | Status: DC | PRN
  Administered 2022-08-13: 100 ug via INTRAVENOUS
  Administered 2022-08-13 (×2): 50 ug via INTRAVENOUS

## 2022-08-13 MED ORDER — BUPIVACAINE LIPOSOME 1.3 % IJ SUSP
INTRAMUSCULAR | Status: AC
  Filled 2022-08-13: qty 20

## 2022-08-13 MED ORDER — BUPIVACAINE LIPOSOME 1.3 % IJ SUSP
INTRAMUSCULAR | Status: DC | PRN
  Administered 2022-08-13: 20 mL

## 2022-08-13 MED ORDER — DEXAMETHASONE SODIUM PHOSPHATE 10 MG/ML IJ SOLN
INTRAMUSCULAR | Status: AC
  Filled 2022-08-13: qty 1

## 2022-08-13 MED ORDER — KETOROLAC TROMETHAMINE 30 MG/ML IJ SOLN
INTRAMUSCULAR | Status: AC
  Filled 2022-08-13: qty 1

## 2022-08-13 MED ORDER — LIDOCAINE 2% (20 MG/ML) 5 ML SYRINGE
INTRAMUSCULAR | Status: DC | PRN
  Administered 2022-08-13: 50 mg via INTRAVENOUS
  Administered 2022-08-13: 80 mg via INTRAVENOUS

## 2022-08-13 MED ORDER — ONDANSETRON HCL 4 MG/2ML IJ SOLN: 4.0000 mg | Freq: Once | INTRAMUSCULAR | Status: DC | PRN

## 2022-08-13 MED ORDER — CLINDAMYCIN PHOSPHATE 900 MG/50ML IV SOLN
900.0000 mg | INTRAVENOUS | Status: AC
  Administered 2022-08-13: 900 mg via INTRAVENOUS

## 2022-08-13 MED ORDER — GLYCOPYRROLATE PF 0.2 MG/ML IJ SOSY
PREFILLED_SYRINGE | INTRAMUSCULAR | Status: AC
  Filled 2022-08-13: qty 1

## 2022-08-13 MED ORDER — STERILE WATER FOR IRRIGATION IR SOLN
Status: DC | PRN
  Administered 2022-08-13: 500 mL

## 2022-08-13 MED ORDER — OXYCODONE HCL 5 MG PO TABS: 5.0000 mg | ORAL_TABLET | Freq: Four times a day (QID) | ORAL | 0 refills | Status: DC | PRN

## 2022-08-13 MED ORDER — INDOCYANINE GREEN 25 MG IV SOLR
INTRAVENOUS | Status: AC
  Filled 2022-08-13: qty 10

## 2022-08-13 MED ORDER — LIDOCAINE HCL (PF) 2 % IJ SOLN
INTRAMUSCULAR | Status: AC
  Filled 2022-08-13: qty 5

## 2022-08-13 MED ORDER — DEXMEDETOMIDINE HCL IN NACL 200 MCG/50ML IV SOLN
INTRAVENOUS | Status: DC | PRN
  Administered 2022-08-13: 20 ug via INTRAVENOUS

## 2022-08-13 MED ORDER — MEPERIDINE HCL 50 MG/ML IJ SOLN: 6.2500 mg | INTRAMUSCULAR | Status: DC | PRN

## 2022-08-13 MED ORDER — MIDAZOLAM HCL 2 MG/2ML IJ SOLN
INTRAMUSCULAR | Status: AC
  Filled 2022-08-13: qty 2

## 2022-08-13 MED ORDER — MIDAZOLAM HCL 2 MG/2ML IJ SOLN
INTRAMUSCULAR | Status: DC | PRN
  Administered 2022-08-13: 200 mg via INTRAVENOUS
  Administered 2022-08-13: 2 mg via INTRAVENOUS

## 2022-08-13 MED ORDER — HYDROMORPHONE HCL 1 MG/ML IJ SOLN
0.2500 mg | INTRAMUSCULAR | Status: DC | PRN
  Administered 2022-08-13 (×2): 0.5 mg via INTRAVENOUS
  Filled 2022-08-13 (×2): qty 0.5

## 2022-08-13 MED ORDER — DOCUSATE SODIUM 100 MG PO CAPS: 100.0000 mg | ORAL_CAPSULE | Freq: Two times a day (BID) | ORAL | 2 refills | Status: DC

## 2022-08-13 MED ORDER — PHENYLEPHRINE 80 MCG/ML (10ML) SYRINGE FOR IV PUSH (FOR BLOOD PRESSURE SUPPORT)
PREFILLED_SYRINGE | INTRAVENOUS | Status: AC
  Filled 2022-08-13: qty 10

## 2022-08-13 MED ORDER — PROPOFOL 10 MG/ML IV BOLUS
INTRAVENOUS | Status: AC
  Filled 2022-08-13: qty 20

## 2022-08-13 MED ORDER — SUGAMMADEX SODIUM 200 MG/2ML IV SOLN
INTRAVENOUS | Status: DC | PRN
  Administered 2022-08-13: 200 mg via INTRAVENOUS

## 2022-08-13 SURGICAL SUPPLY — 50 items
ADH SKN CLS APL DERMABOND .7 (GAUZE/BANDAGES/DRESSINGS) ×1
APL PRP STRL LF DISP 70% ISPRP (MISCELLANEOUS) ×1
APL SWBSTK 6 STRL LF DISP (MISCELLANEOUS) ×2
APPLICATOR COTTON TIP 6 STRL (MISCELLANEOUS) IMPLANT
APPLICATOR COTTON TIP 6IN STRL (MISCELLANEOUS) ×2 IMPLANT
BLADE SURG 15 STRL LF DISP TIS (BLADE) ×1 IMPLANT
BLADE SURG 15 STRL SS (BLADE) ×1
CANNULA REDUC XI 12-8 STAPL (CANNULA)
CANNULA REDUCER 12-8 DVNC XI (CANNULA) IMPLANT
CHLORAPREP W/TINT 26 (MISCELLANEOUS) ×1 IMPLANT
CLIP LIGATING HEM O LOK PURPLE (MISCELLANEOUS) ×1 IMPLANT
COVER TIP SHEARS 8 DVNC (MISCELLANEOUS) ×1 IMPLANT
COVER TIP SHEARS 8MM DA VINCI (MISCELLANEOUS) ×1
DEFOGGER SCOPE WARMER CLEARIFY (MISCELLANEOUS) IMPLANT
DERMABOND ADVANCED .7 DNX12 (GAUZE/BANDAGES/DRESSINGS) ×1 IMPLANT
DRAPE ARM DVNC X/XI (DISPOSABLE) ×4 IMPLANT
DRAPE COLUMN DVNC XI (DISPOSABLE) ×1 IMPLANT
DRAPE DA VINCI XI ARM (DISPOSABLE) ×4
DRAPE DA VINCI XI COLUMN (DISPOSABLE) ×1
ELECT REM PT RETURN 9FT ADLT (ELECTROSURGICAL) ×1
ELECTRODE REM PT RTRN 9FT ADLT (ELECTROSURGICAL) ×1 IMPLANT
GLOVE BIOGEL PI IND STRL 6.5 (GLOVE) ×2 IMPLANT
GLOVE BIOGEL PI IND STRL 7.0 (GLOVE) ×3 IMPLANT
GLOVE SURG SS PI 6.5 STRL IVOR (GLOVE) ×2 IMPLANT
GLOVE SURG SS PI 7.0 STRL IVOR (GLOVE) IMPLANT
GOWN STRL REUS W/TWL LRG LVL3 (GOWN DISPOSABLE) ×3 IMPLANT
GRASPER SUT TROCAR 14GX15 (MISCELLANEOUS) IMPLANT
MANIFOLD NEPTUNE II (INSTRUMENTS) ×1 IMPLANT
NDL HYPO 21X1.5 SAFETY (NEEDLE) ×1 IMPLANT
NDL INSUFFLATION 14GA 120MM (NEEDLE) ×1 IMPLANT
NEEDLE HYPO 21X1.5 SAFETY (NEEDLE) ×1 IMPLANT
NEEDLE INSUFFLATION 14GA 120MM (NEEDLE) ×1 IMPLANT
OBTURATOR OPTICAL STANDARD 8MM (TROCAR) ×1
OBTURATOR OPTICAL STND 8 DVNC (TROCAR) ×1
OBTURATOR OPTICALSTD 8 DVNC (TROCAR) ×1 IMPLANT
PACK LAP CHOLE LZT030E (CUSTOM PROCEDURE TRAY) ×1 IMPLANT
PAD ARMBOARD 7.5X6 YLW CONV (MISCELLANEOUS) ×1 IMPLANT
PENCIL HANDSWITCHING (ELECTRODE) IMPLANT
SEAL CANN UNIV 5-8 DVNC XI (MISCELLANEOUS) ×3 IMPLANT
SEAL XI 5MM-8MM UNIVERSAL (MISCELLANEOUS) ×4
SET BASIN LINEN APH (SET/KITS/TRAYS/PACK) ×1 IMPLANT
SET TUBE SMOKE EVAC HIGH FLOW (TUBING) ×1 IMPLANT
STAPLER CANNULA SEAL DVNC XI (STAPLE) IMPLANT
STAPLER CANNULA SEAL XI (STAPLE)
SUT MNCRL AB 4-0 PS2 18 (SUTURE) ×2 IMPLANT
SUT VICRYL 0 AB UR-6 (SUTURE) IMPLANT
SYR 20ML LL LF (SYRINGE) ×1 IMPLANT
SYS RETRIEVAL 5MM INZII UNIV (BASKET) ×1
SYSTEM RETRIEVL 5MM INZII UNIV (BASKET) IMPLANT
WATER STERILE IRR 500ML POUR (IV SOLUTION) ×1 IMPLANT

## 2022-08-13 NOTE — Interval H&P Note (Signed)
History and Physical Interval Note:  08/13/2022 9:48 AM  Yvette Conley  has presented today for surgery, with the diagnosis of CHOLELITHIASIS.  The various methods of treatment have been discussed with the patient and family. After consideration of risks, benefits and other options for treatment, the patient has consented to  Procedure(s): XI ROBOTIC [place] (N/A) as a surgical intervention.  The patient's history has been reviewed, patient examined, no change in status, stable for surgery.  I have reviewed the patient's chart and labs.  Questions were answered to the patient's satisfaction.     [place]

## 2022-08-13 NOTE — Anesthesia Preprocedure Evaluation (Addendum)
Anesthesia Evaluation  Patient identified by MRN, date of birth, ID band Patient awake    Reviewed: Allergy & Precautions, H&P , NPO status , Patient's Chart, lab work & pertinent test results  Airway Mallampati: II  TM Distance: >3 FB Neck ROM: Full    Dental  (+) Dental Advisory Given, Teeth Intact   Pulmonary neg pulmonary ROS   Pulmonary exam normal breath sounds clear to auscultation       Cardiovascular negative cardio ROS Normal cardiovascular exam Rhythm:Regular Rate:Normal     Neuro/Psych  Headaches PSYCHIATRIC DISORDERS       Neuromuscular disease    GI/Hepatic Neg liver ROS,GERD  Medicated and Controlled,,  Endo/Other  diabetes, Well Controlled, Type 2    Renal/GU negative Renal ROS  negative genitourinary   Musculoskeletal  (+)  Fibromyalgia -  Abdominal   Peds negative pediatric ROS (+)  Hematology  (+) Blood dyscrasia, anemia   Anesthesia Other Findings   Reproductive/Obstetrics negative OB ROS                             Anesthesia Physical Anesthesia Plan  ASA: 2  Anesthesia Plan: General   Post-op Pain Management: Dilaudid IV   Induction: Intravenous  PONV Risk Score and Plan: 4 or greater and Ondansetron, Dexamethasone and Midazolam  Airway Management Planned: Oral ETT  Additional Equipment:   Intra-op Plan:   Post-operative Plan:   Informed Consent: I have reviewed the patients History and Physical, chart, labs and discussed the procedure including the risks, benefits and alternatives for the proposed anesthesia with the patient or authorized representative who has indicated his/her understanding and acceptance.     Dental advisory given  Plan Discussed with: CRNA and Surgeon  Anesthesia Plan Comments:         Anesthesia Quick Evaluation

## 2022-08-13 NOTE — Discharge Instructions (Signed)
Ambulatory Surgery Discharge Instructions  General Anesthesia or Sedation Do not drive or operate heavy machinery for 24 hours.  Do not consume alcohol, tranquilizers, sleeping medications, or any non-prescribed medications for 24 hours. Do not make important decisions or sign any important papers in the next 24 hours. You should have someone with you tonight at home.  Activity  You are advised to go directly home from the hospital.  Restrict your activities and rest for a day.  Resume light activity tomorrow. No heavy lifting over 10 lbs or strenuous exercise.  Fluids and Diet Begin with clear liquids, bouillon, dry toast, soda crackers.  If not nauseated, you may go to a regular diet when you desire.  Greasy and spicy foods are not advised.  Medications  If you have not had a bowel movement in 24 hours, take 2 tablespoons over the counter Milk of mag.             You May resume your blood thinners tomorrow (Aspirin, coumadin, or other).  You are being discharged with prescriptions for Opioid/Narcotic Medications: There are some specific considerations for these medications that you should know. Opioid Meds have risks & benefits. Addiction to these meds is always a concern with prolonged use Take medication only as directed Do not drive while taking narcotic pain medication Do not crush tablets or capsules Do not use a different container than medication was dispensed in Lock the container of medication in a cool, dry place out of reach of children and pets. Opioid medication can cause addiction Do not share with anyone else (this is a felony) Do not store medications for future use. Dispose of them properly.     Disposal:  Find a [facility] drug take back site near you.  If you can't get to a drug take back site, use the recipe below as a last resort to dispose of expired, unused or unwanted drugs. Disposal  (Do not dispose chemotherapy drugs this way, talk to your  prescribing doctor instead.) Step 1: Mix drugs (do not crush) with dirt, kitty litter, or used coffee grounds and add a small amount of water to dissolve any solid medications. Step 2: Seal drugs in plastic bag. Step 3: Place plastic bag in trash. Step 4: Take prescription container and scratch out personal information, then recycle or throw away.  Operative Site  You have a liquid bandage over your incisions, this will begin to flake off in about a week. Ok to shower tomorrow. Keep wound clean and dry. No baths or swimming. No lifting more than 10 pounds.  Contact Information: If you have questions or concerns, please call our office, [phone], Monday- Thursday 8AM-5PM and Friday 8AM-12Noon.  If it is after hours or on the weekend, please call [facility] Main Number, [phone], and ask to speak to the surgeon on call for Dr. Norene Oliveri at [facility].   SPECIFIC COMPLICATIONS TO WATCH FOR: Inability to urinate Fever over 101? F by mouth Nausea and vomiting lasting longer than 24 hours. Pain not relieved by medication ordered Swelling around the operative site Increased redness, warmth, hardness, around operative area Numbness, tingling, or cold fingers or toes Blood -soaked dressing, (small amounts of oozing may be normal) Increasing and progressive drainage from surgical area or exam site  

## 2022-08-13 NOTE — Anesthesia Procedure Notes (Signed)
Procedure Name: Intubation Date/Time: 08/13/2022 10:15 AM  Performed by: Sharee Pimple, CRNAPre-anesthesia Checklist: Patient identified, Emergency Drugs available, Suction available and Patient being monitored Patient Re-evaluated:Patient Re-evaluated prior to induction Oxygen Delivery Method: Circle system utilized Preoxygenation: Pre-oxygenation with 100% oxygen Induction Type: IV induction Ventilation: Mask ventilation without difficulty Laryngoscope Size: Mac and 3 Grade View: Grade I Tube type: Oral Tube size: 7.0 mm Number of attempts: 1 Airway Equipment and Method: Stylet Placement Confirmation: ETT inserted through vocal cords under direct vision, positive ETCO2, CO2 detector and breath sounds checked- equal and bilateral Secured at: 21 cm Tube secured with: Tape Dental Injury: Teeth and Oropharynx as per pre-operative assessment

## 2022-08-13 NOTE — Op Note (Signed)
[facility] Operative Notes  Preoperative diagnosis:  symptomatic cholelithiasis  Postoperative diagnosis: same as above  Procedure: Robotic assisted Laparoscopic Cholecystectomy.   Anesthesia: GETA   Surgeon: Graciella Freer, DO  Specimen: Gallbladder  Complications: None  EBL: 77m  Wound Classification: Clean Contaminated  Indications: Patient is a 37year old female who presents for cholecystectomy secondary to symptomatic cholelithiasis.  She is agreeable to surgery.  All risks and benefits of performing this procedure were discussed with the patient including pain, infection, bleeding, damage to the surrounding structures, and need for more procedures or surgery. The patient voiced understanding of the procedure, all questions were sought and answered, and consent was obtained.  Findings: Critical view of safety noted Cystic duct and artery identified, ligated and divided, clips remained intact at end of procedure Adequate hemostasis  Description of procedure:  The patient was placed on the operating table in the supine position. SCDs placed, pre-op abx administered.  General anesthesia was induced and OG tube placed by anesthesia. A time-out was completed verifying correct patient, procedure, site, positioning, and implant(s) and/or special equipment prior to beginning this procedure. The abdomen was prepped and draped in the usual sterile fashion.    Veress needle was placed at the umbilicus and insufflation was started after confirming a positive saline drop test and no immediate increase in abdominal pressure.  After reaching 15 mm, the Veress needle was removed and a 8 mm port was placed via optiview technique under umbilicus measured 20000000from gallbladder.  The abdomen was inspected and no abnormalities or injuries were found.  Under direct vision, ports were placed in the following locations: One 8 mm patient left of the umbilicus, 8cm from the  optiviewed port, one 8 mm port placed to the patient right of the umbilical port 8 cm apart.  1 additional 8 mm port placed lateral to the right sided 8 mm port.  Once ports were placed, The table was placed in the reverse Trendelenburg position with the right side up. The Xi platform was brought into the operative field and docked to the ports successfully.  An endoscope was placed through the umbilical port, fenestrated bipolar through the adjacent patient right port, prograsp to the far patient right port, and then a hook cautery in the left port.  The dome of the gallbladder was grasped with prograsp, passed and retracted over the dome of the liver. Adhesions between the gallbladder and omentum, duodenum and transverse colon were lysed via hook cautery. The infundibulum was grasped with the fenestrated grasper and retracted toward the right lower quadrant. This maneuver exposed Calot's triangle. The peritoneum overlying the gallbladder infundibulum was then dissected  and the cystic duct and cystic artery identified.  Critical view of safety with the liver bed clearly visible behind the duct and artery with no additional structures noted.  The cystic duct and cystic artery clipped and divided close to the gallbladder.     The gallbladder was then dissected from its peritoneal and liver bed attachments by electrocautery. Hemostasis was checked prior to removing the hook cautery and the Endo Catch bag was then placed through the left port and the gallbladder was removed.  The gallbladder was passed off the table as a specimen. There was no evidence of bleeding from the gallbladder fossa or cystic artery or leakage of the bile from the cystic duct stump. The left port site closed with PMI using 0 vicryl under direct vision.  Abdomen desufflated and secondary trocars were removed under direct  vision. No bleeding was noted.  Exparel was instilled at all incision sites.  All skin incisions then closed with  subcuticular sutures of 4-0 monocryl and dressed with topical skin adhesive. The orogastric tube was removed and patient extubated.  The patient tolerated the procedure well and was taken to the postanesthesia care unit in stable condition.  All sponge and instrument count correct at end of procedure.   Graciella Freer, DO [facility] [address] [place], [place] [address] [phone] (office)

## 2022-08-13 NOTE — Anesthesia Postprocedure Evaluation (Signed)
Anesthesia Post Note  Patient: Yvette Conley  Procedure(s) Performed: XI ROBOTIC ASSISTED LAPAROSCOPIC CHOLECYSTECTOMY (Abdomen)  Patient location during evaluation: Phase II Anesthesia Type: General Level of consciousness: awake and alert and oriented Pain management: pain level controlled Vital Signs Assessment: post-procedure vital signs reviewed and stable Respiratory status: spontaneous breathing, nonlabored ventilation and respiratory function stable Cardiovascular status: blood pressure returned to baseline and stable Postop Assessment: no apparent nausea or vomiting Anesthetic complications: no  No notable events documented.   Last Vitals:  Vitals:   08/13/22 1315 08/13/22 1321  BP: 137/79 132/79  Pulse: 60 69  Resp: 15 16  Temp:  36.7 C  SpO2: 99% 98%    Last Pain:  Vitals:   08/13/22 1321  TempSrc: Oral  PainSc:                  Yvette Conley

## 2022-08-13 NOTE — Progress Notes (Signed)
[facility]  Spoke with the patient's husband in the consultation room.  I explained that she tolerated the procedure without difficulty.  She has dissolvable stitches under the skin with overlying skin glue.  This will flake off in 10 to 14 days.  I discharged her home with a prescription for narcotic pain medication that they should take as needed for pain.  I also want her taking scheduled Tylenol.  If they take the narcotic pain medication, they should take a stool softener as well.  The patient will follow-up with me in 2 weeks for phone follow-up.  All questions were answered to his expressed satisfaction.  Graciella Freer, DO [facility] [address] [place], [place] [address] [phone] (office)

## 2022-08-13 NOTE — Transfer of Care (Signed)
Immediate Anesthesia Transfer of Care Note  Patient: Yvette Conley  Procedure(s) Performed: XI ROBOTIC ASSISTED LAPAROSCOPIC CHOLECYSTECTOMY (Abdomen)  Patient Location: PACU  Anesthesia Type:General  Level of Consciousness: awake, alert  and drowsy  Airway & Oxygen Therapy: Patient Spontanous Breathing and Patient connected to nasal cannula oxygen  Post-op Assessment: Report given to RN and Post -op Vital signs reviewed and stable  Post vital signs: Reviewed and stable  Last Vitals:  Vitals Value Taken Time  BP 120/92 08/13/22 1208  Temp    Pulse 88 08/13/22 1210  Resp 24 08/13/22 1210  SpO2 100 % 08/13/22 1210  Vitals shown include unvalidated device data.  Last Pain:  Vitals:   08/13/22 0906  TempSrc: Oral  PainSc: 1       Patients Stated Pain Goal: 7 ([id number])  Complications: No notable events documented.

## 2022-08-14 LAB — SURGICAL PATHOLOGY

## 2022-08-14 NOTE — Addendum Note (Signed)
Addendum  created 08/14/22 0744 by Karna Dupes, CRNA   Intraprocedure Meds edited

## 2022-08-17 ENCOUNTER — Telehealth: Payer: Self-pay | Admitting: *Deleted

## 2022-08-17 NOTE — Telephone Encounter (Signed)
Surgical Date: 08/13/2022 Procedure: XI Robotic Assisted Lap Chole  Received call from patient [phone]~ telephone.   Patient reports that she has multiple concerns as follows: Patient noted hives under her breast and to upper abdomen. States that she did note 1-2 hive like areas to chest, but irritation remains mostly under breasts. States that area is red and itchy. Reports that she has been taking Benadryl and using OTC soothing cream with essential oils like Arnica Flower, Eucalyptus, Peppermint, etc. Advised to add OTC Cortisone cream for irritation, but to be sure to avoid incisions.  Patient reports that L sided incision is swollen and painful. Reports that other incisions are healing well and are no longer painful. Advised that the left side incision is manipulated a lot in the surgery and may be more tender. Advised to use ice and IBU as directed.  Patient also reports air bubble like sensation to L ribs. Advised that there may be residual trapped gas in the abdominal cavity. Advised that upper arm movement and walking assist in the body moving the trapped air and that the body will absorb the air in time.   Patient agreeable to plan and verbalized understanding. States that she will call back with any further concerns.   Dr. Okey Dupre made aware.

## 2022-08-20 ENCOUNTER — Encounter: Payer: Self-pay | Admitting: Surgery

## 2022-08-20 ENCOUNTER — Ambulatory Visit (INDEPENDENT_AMBULATORY_CARE_PROVIDER_SITE_OTHER): Payer: BC Managed Care – PPO | Admitting: Surgery

## 2022-08-20 ENCOUNTER — Ambulatory Visit: Payer: BC Managed Care – PPO | Admitting: Orthopedic Surgery

## 2022-08-20 VITALS — BP 105/73 | HR 93 | Temp 98.1°F | Resp 14 | Ht 67.0 in | Wt 237.0 lb

## 2022-08-20 DIAGNOSIS — Z09 Encounter for follow-up examination after completed treatment for conditions other than malignant neoplasm: Secondary | ICD-10-CM

## 2022-08-20 DIAGNOSIS — T7840XA Allergy, unspecified, initial encounter: Secondary | ICD-10-CM

## 2022-08-20 MED ORDER — CLINDAMYCIN HCL 300 MG PO CAPS: 300.0000 mg | ORAL_CAPSULE | Freq: Three times a day (TID) | ORAL | 0 refills | Status: AC

## 2022-08-20 MED ORDER — METHYLPREDNISOLONE 4 MG PO TBPK: ORAL_TABLET | ORAL | 0 refills | Status: DC

## 2022-08-20 NOTE — Telephone Encounter (Signed)
Surgical Date: 08/13/2022 Procedure: XI Robotic Assisted Lap Chole   Received call from patient [phone]~ telephone.   Patient reports that she has noted increased hives to abdomen, torso and sides. States that she has used benadryl and cortisone cream with minimal relief.   Denies any hives to neck or face. Denies SOB.   Patient also states that she has noted that umbilical incision is leaking clear to serosanguinous fluid.   Appointment scheduled for evaluation.

## 2022-08-20 NOTE — Progress Notes (Signed)
[facility] Note   HPI:  37 y.o. Female presents to clinic for post-op follow-up status post robotic assisted laparoscopic cholecystectomy on 2/29.  Since the surgery, she has had pain, worse at her left-sided incision site, but this is adequately controlled with pain medications.  She has had hives across her upper abdomen, and started having increased swelling and erythema at all of her incision sites.  She denies any fevers or chills.  She had a small amount of serous drainage from her umbilical incision site.  She is tolerating a diet without nausea and vomiting, though she does have decreased appetite.  She is passing flatus without issue.  Review of Systems:  All other review of systems: otherwise negative   Vital Signs:  BP 105/73   Pulse 93   Temp 98.1 F (36.7 C) (Oral)   Resp 14   Ht 5\' 7"  (1.702 m)   Wt 237 lb (107.5 kg)   LMP 06/26/2022 (Exact Date) Comment: Hcg point of care test - negative on 08/11/2022  SpO2 98%   BMI 37.12 kg/m    Physical Exam:  Physical Exam Vitals reviewed.  Constitutional:      Appearance: Normal appearance.  Abdominal:     Comments: Abdomen soft, nondistended, no percussion tenderness, mild incisional tenderness to palpation: No rigidity, guarding, rebound tenderness; incisions with erythema and edema, hives along with adhesive bands from where drapes were placed, Dermabond in place  Neurological:     Mental Status: She is alert.     Laboratory studies: None   Imaging:  None  Pathology:  A. GALLBLADDER, CHOLECYSTECTOMY:  -  Chronic cholecystitis and cholelithiasis.   Assessment:  37 y.o. 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  Graciella Freer, DO [facility] [address] [place], [place] [address] [phone] (office)

## 2022-08-26 ENCOUNTER — Ambulatory Visit (INDEPENDENT_AMBULATORY_CARE_PROVIDER_SITE_OTHER): Payer: BC Managed Care – PPO | Admitting: Surgery

## 2022-08-26 DIAGNOSIS — Z09 Encounter for follow-up examination after completed treatment for conditions other than malignant neoplasm: Secondary | ICD-10-CM

## 2022-08-26 DIAGNOSIS — G8918 Other acute postprocedural pain: Secondary | ICD-10-CM

## 2022-08-26 NOTE — Progress Notes (Signed)
[facility]  I am calling the patient for post operative evaluation. This is not a billable encounter as it is under the [place] charges for the surgery.  The patient had a robotic assisted laparoscopic cholecystectomy on 2/29. The patient reports that she is doing much better since follow-up in the office regarding her skin reactions.  She was able to get the glue off of her incision sites, and the redness and swelling has decreased.  Her hives have completely resolved as well.. The are tolerating a die, and having regular Bms.  The incisions are are healing well.  She is complaining of worsening pain at some of her incision sites, and it got significantly worse yesterday.  I advised the patient that she is only 2 weeks out from surgery, and she may still have some pains, specifically at her left-sided incision site, as there is a stitch through her abdominal wall from where I removed her gallbladder.  Given the patient's concern for persistent pain, I have scheduled a phone follow-up with the patient on 3/20 to see how her pain is doing at that time.  I advised that if her pain significantly worsens, she should let the office know.  If she is still having pain at the end of March, and we will consider getting some additional imaging to evaluate sources of her pain.  All questions answered to the patient's expressed satisfaction.  Graciella Freer, DO [facility] [address] [place], [place] [address] [phone] (office)

## 2022-09-03 ENCOUNTER — Ambulatory Visit: Payer: BC Managed Care – PPO | Admitting: Orthopedic Surgery

## 2022-09-03 DIAGNOSIS — S93492D Sprain of other ligament of left ankle, subsequent encounter: Secondary | ICD-10-CM

## 2022-09-03 NOTE — Patient Instructions (Signed)
TIP TOES EXERCISES 3 SETS OF 10 FOR 2 WEEKS THEN TRY DOING IT WITH THE INJURED LEG 3 SETS OF 10

## 2022-09-03 NOTE — Progress Notes (Signed)
Chief Complaint  Patient presents with   Ankle Pain    Left feels better but has an area medial ankle that still causes pain     Yvette Conley is improving.  She cannot quite do a single-leg heel rise yet so I advised her to go ahead and start doing double leg heel rises and then single-leg heel rises in a couple weeks she can return to light exercise and activity and progress as tolerated  Her ankle exam today shows that she has some tenderness in the anterior talofibular ligament and some tenderness in the Achilles but it is intact  She has a trace positive anterior drawer with a firm endpoint mild pain with inversion overall range of motion has improved  She will advance activities as tolerated progressed to single-leg heel rise 3 sets of 10 CS as needed

## 2022-09-10 ENCOUNTER — Ambulatory Visit (INDEPENDENT_AMBULATORY_CARE_PROVIDER_SITE_OTHER): Payer: BC Managed Care – PPO | Admitting: Surgery

## 2022-09-10 DIAGNOSIS — Z09 Encounter for follow-up examination after completed treatment for conditions other than malignant neoplasm: Secondary | ICD-10-CM

## 2022-09-10 NOTE — Progress Notes (Signed)
[facility]  I am calling the patient for post operative evaluation. This is not a billable encounter as it is under the [place] [facility] charges for the surgery.  The patient had a robotic assisted laparoscopic cholecystectomy on 2/29. The patient reports that she is doing significantly better.  Her abdominal pain has almost completely resolved.  She is tolerating a diet without nausea and vomiting, moving her bowels without issue.  Her incisions have healed well.  I advised her that she can submerge her incisions, as she is 4 weeks out from surgery.  The patient has no concerns.   Will see the patient PRN.   Graciella Freer, DO [facility] [address] [place], [place] [address] [phone] (office)

## 2022-09-14 ENCOUNTER — Encounter: Payer: Self-pay | Admitting: Internal Medicine

## 2022-09-15 ENCOUNTER — Encounter: Payer: Self-pay | Admitting: Internal Medicine

## 2022-09-15 ENCOUNTER — Other Ambulatory Visit: Payer: Self-pay | Admitting: Internal Medicine

## 2022-09-15 MED ORDER — MELOXICAM 7.5 MG PO TABS: 7.5000 mg | ORAL_TABLET | Freq: Every day | ORAL | 0 refills | Status: DC

## 2022-10-07 ENCOUNTER — Ambulatory Visit (INDEPENDENT_AMBULATORY_CARE_PROVIDER_SITE_OTHER): Payer: BC Managed Care – PPO | Admitting: Internal Medicine

## 2022-10-07 ENCOUNTER — Encounter: Payer: Self-pay | Admitting: Internal Medicine

## 2022-10-07 VITALS — BP 118/86 | Ht 67.0 in | Wt 238.8 lb

## 2022-10-07 DIAGNOSIS — M79672 Pain in left foot: Secondary | ICD-10-CM | POA: Diagnosis not present

## 2022-10-07 DIAGNOSIS — M79671 Pain in right foot: Secondary | ICD-10-CM

## 2022-10-07 MED ORDER — TRIAMCINOLONE ACETONIDE 40 MG/ML IJ SUSP
40.0000 mg | Freq: Once | INTRAMUSCULAR | Status: AC
Start: 2022-10-07 — End: 2022-10-07
  Administered 2022-10-07: 40 mg via INTRA_ARTICULAR

## 2022-10-07 NOTE — Assessment & Plan Note (Signed)
Patient last seen in January for right heel pain.Patient has history of bilateral plantar fascitis and a right heel spur. Recently had left ankle fracture and putting more weight on right leg.  A few week ago patient sent message and she had increased heel pain. I prescribed meloxicam , but pain has continued. It is not a severe as previously, but bothering her daily.  Evaluated with POCUS today and plantar fascia is thicker on right foot compared to left. Will proceed with steroid injection today. If pain returns I would like to pursue MRI to better evaluate bone spur and plantar fascia.   Plantar Fascia Injection Procedure Note Yvette Conley 11/23/1985 /24/2024@  Procedure: CPT Code 16109, Injection(s); single tendon sheath, or ligament, aponeurosis (eg, plantar fascia) Indications: Pain  Procedure Details Verbal consent obtained. Risks including risk of rupture, hypopigmentation reviewed in addition to benefits and alternatives were reviewed. Chloraprep used for prep. Ethyl Chloride used for anesthesia. Under sterile conditions, using the medial approach 3 cc of Lidocaine 1% and Kenalog 40 mg injected superior to plantar fascia and fanned. No compications. Decreased pain post-injection.

## 2022-10-07 NOTE — Progress Notes (Signed)
   HPI:Yvette Conley is a 37 y.o. female with history presents for follow up of right heel pain . For the details of today's visit, please refer to the assessment and plan.  Physical Exam: Vitals:   10/07/22 1620  BP: 118/86  Weight: 238 lb 12.8 oz (108.3 kg)  Height:  (1.702 m)     Physical Exam Constitutional:      General: She is not in acute distress.    Appearance: She is not ill-appearing.  Musculoskeletal:        General: No swelling or deformity.     Right lower leg: No edema.     Right foot: Normal range of motion and normal capillary refill. Tenderness (on palpation of heel. No pain with dorsiflexion of great toe. Has prominent planatar fascia with dorsiflexed.) present.  Skin:    Findings: No erythema or lesion.      Assessment & Plan:   Yvette Conley was seen today for follow-up.  Acute foot pain, left Overview: Twisted ankle on 07/18/2022   Foot pain, bilateral  Pain of right heel Assessment & Plan: Patient last seen in January for right heel pain.Patient has history of bilateral plantar fascitis and a right heel spur. Recently had left ankle fracture and putting more weight on right leg.  A few week ago patient sent message and she had increased heel pain. I prescribed meloxicam , but pain has continued. It is not a severe as previously, but bothering her daily.  Evaluated with POCUS today and plantar fascia is thicker on right foot compared to left. Will proceed with steroid injection today. If pain returns I would like to pursue MRI to better evaluate bone spur and plantar fascia.   Plantar Fascia Injection Procedure Note Yvette Conley 06-03-1986 /24/2024@  Procedure: CPT Code 09811, Injection(s); single tendon sheath, or ligament, aponeurosis (eg, plantar fascia) Indications: Pain  Procedure Details Verbal consent obtained. Risks including risk of rupture, hypopigmentation reviewed in addition to benefits and alternatives were reviewed.  Chloraprep used for prep. Ethyl Chloride used for anesthesia. Under sterile conditions, using the medial approach 3 cc of Lidocaine 1% and Kenalog 40 mg injected superior to plantar fascia and fanned. No compications. Decreased pain post-injection.    Orders: -     Triamcinolone Acetonide      Yvette Banister, MD

## 2022-10-08 ENCOUNTER — Encounter: Payer: Self-pay | Admitting: Internal Medicine

## 2022-10-20 ENCOUNTER — Other Ambulatory Visit: Payer: Self-pay | Admitting: Diagnostic Neuroimaging

## 2022-11-03 ENCOUNTER — Ambulatory Visit: Payer: BC Managed Care – PPO | Admitting: Family Medicine

## 2022-11-25 ENCOUNTER — Ambulatory Visit: Payer: BC Managed Care – PPO | Admitting: Family Medicine

## 2022-12-01 ENCOUNTER — Other Ambulatory Visit: Payer: Self-pay | Admitting: Family Medicine

## 2022-12-21 ENCOUNTER — Ambulatory Visit: Payer: BC Managed Care – PPO | Admitting: Internal Medicine

## 2022-12-21 VITALS — BP 111/72 | HR 91 | Ht 67.0 in | Wt 249.1 lb

## 2022-12-21 DIAGNOSIS — L03115 Cellulitis of right lower limb: Secondary | ICD-10-CM | POA: Diagnosis not present

## 2022-12-21 NOTE — Progress Notes (Unsigned)
   HPI:[name] is a 37 y.o. female who presents for evaluation of right foot heel pain. Patient has history of plantar fascitis. Last seen 4/24 for exacerbation of plantar fascitis and given steroid injection. Plantar fascitis improved. For the last month she noticed no heel pain. She had some cracked skin on her heel and close to medial side of heel where it was injected. Site of previous injection had some drainage of purulent fluid. Area has been red and started to get tender. No specific event she felt started pain. She has not notice any bruising.  She has been to the beach in the last month. F  Physical Exam: Vitals:   12/21/22 1340  BP: 111/72  Pulse: 91  SpO2: 96%  Weight: 249 lb 1.9 oz (113 kg)  Height: 5\' 7"  (1.702 m)     Physical Exam Vitals reviewed.  Constitutional:      General: She is not in acute distress.    Appearance: She is not ill-appearing.  Skin:    Comments: Right medial side of heel has dry and cracked skin. One large crack down to dermis. Erythema and TTP. Increased warmth.       Assessment & Plan:   Yvette Conley was seen today for foot injury.  Cellulitis of heel, right Assessment & Plan: Acute, uncomplicated problem Patient has warm, erythematous , and cracked skin at right heel. Described some purulent drainage at home, no appreciable collection or drainage on my exam.  I expect cellulitis at this time. I have considered rupture of plantar fascia but it does not appear ruptured on POCUS, no bruising on exam, and description by patient is not describe a rupture. Prescribed Doxycyline I have asked patient to follow up with me in one week with a mychart message. If she is not improving I am going to send patient for MRI   Orders: -     Doxycycline Hyclate; Take 1 tablet (100 mg total) by mouth 2 (two) times daily for 10 days.  Dispense: 20 tablet; Refill: 0      Milus Banister, MD

## 2022-12-21 NOTE — Patient Instructions (Signed)
Thank you, [name] for allowing [place] to provide your care today.   You have cellulitis.  Take doxycycline for 10 days At the end of your treatment send me a message on mychart to confirm you have improved  I recommend Dr.Hudnall and [facility] if you need further treatment of plantar fascitis in the future. I will put this in notes as well.   Thurmon Fair, M.D.

## 2022-12-22 ENCOUNTER — Encounter: Payer: Self-pay | Admitting: Internal Medicine

## 2022-12-22 MED ORDER — DOXYCYCLINE HYCLATE 100 MG PO TABS
100.00 mg | ORAL_TABLET | Freq: Two times a day (BID) | ORAL | 0 refills | Status: AC
Start: 2022-12-22 — End: 2023-01-01

## 2022-12-23 DIAGNOSIS — L03115 Cellulitis of right lower limb: Secondary | ICD-10-CM | POA: Insufficient documentation

## 2022-12-23 NOTE — Assessment & Plan Note (Signed)
Acute, uncomplicated problem Patient has warm, erythematous , and cracked skin at right heel. Described some purulent drainage at home, no appreciable collection or drainage on my exam.  I expect cellulitis at this time. I have considered rupture of plantar fascia but it does not appear ruptured on POCUS, no bruising on exam, and description by patient is not describe a rupture. Prescribed Doxycyline I have asked patient to follow up with me in one week with a mychart message. If she is not improving I am going to send patient for MRI

## 2023-01-12 ENCOUNTER — Encounter: Payer: Self-pay | Admitting: Internal Medicine

## 2023-01-12 ENCOUNTER — Ambulatory Visit: Payer: BC Managed Care – PPO | Admitting: Family Medicine

## 2023-01-12 ENCOUNTER — Encounter: Payer: Self-pay | Admitting: Family Medicine

## 2023-01-12 VITALS — BP 112/76 | HR 90 | Ht 67.0 in | Wt 249.0 lb

## 2023-01-12 DIAGNOSIS — Z23 Encounter for immunization: Secondary | ICD-10-CM | POA: Diagnosis not present

## 2023-01-12 DIAGNOSIS — Z6837 Body mass index (BMI) 37.0-37.9, adult: Secondary | ICD-10-CM

## 2023-01-12 DIAGNOSIS — M62830 Muscle spasm of back: Secondary | ICD-10-CM

## 2023-01-12 DIAGNOSIS — E6609 Other obesity due to excess calories: Secondary | ICD-10-CM | POA: Diagnosis not present

## 2023-01-12 DIAGNOSIS — Z13220 Encounter for screening for lipoid disorders: Secondary | ICD-10-CM

## 2023-01-12 DIAGNOSIS — Z139 Encounter for screening, unspecified: Secondary | ICD-10-CM

## 2023-01-12 DIAGNOSIS — M722 Plantar fascial fibromatosis: Secondary | ICD-10-CM

## 2023-01-12 DIAGNOSIS — R1031 Right lower quadrant pain: Secondary | ICD-10-CM

## 2023-01-12 DIAGNOSIS — G43009 Migraine without aura, not intractable, without status migrainosus: Secondary | ICD-10-CM

## 2023-01-12 DIAGNOSIS — E559 Vitamin D deficiency, unspecified: Secondary | ICD-10-CM | POA: Diagnosis not present

## 2023-01-12 DIAGNOSIS — R7303 Prediabetes: Secondary | ICD-10-CM

## 2023-01-12 DIAGNOSIS — K8020 Calculus of gallbladder without cholecystitis without obstruction: Secondary | ICD-10-CM

## 2023-01-12 NOTE — Patient Instructions (Signed)
F/U in 4 monhts, call if you need me sooner  Please get fasting labs 2nd week in Septemebr, CBC, lipid, cmp and EGFR , TSH, hBA1c and Vit D  Start OTC vitamin D3, 1000  international units daily   It is important that you exercise regularly at least 30 minutes 5 times a week. If you develop chest pain, have severe difficulty breathing, or feel very tired, stop exercising immediately and seek medical attention  Think about what you will eat, plan ahead. Choose " clean, green, fresh or frozen" over canned, processed or packaged foods which are more sugary, salty and fatty. 70 to 75% of food eaten should be vegetables and fruit. Three meals at set times with snacks allowed between meals, but they must be fruit or vegetables. Aim to eat over a 12 hour period , example 7 am to 7 pm, and STOP after  your last meal of the day. Drink water,generally about 64 ounces per day, no other drink is as healthy. Fruit juice is best enjoyed in a healthy way, by EATING the fruit.  Practce good sleep hygiene as we discussed please

## 2023-01-14 ENCOUNTER — Encounter: Payer: Self-pay | Admitting: Family Medicine

## 2023-01-14 DIAGNOSIS — R7303 Prediabetes: Secondary | ICD-10-CM | POA: Insufficient documentation

## 2023-01-14 DIAGNOSIS — Z13220 Encounter for screening for lipoid disorders: Secondary | ICD-10-CM | POA: Insufficient documentation

## 2023-01-14 NOTE — Assessment & Plan Note (Signed)
Hyperlipidemia:Low fat diet discussed and encouraged.   Lipid Panel  Lab Results  Component Value Date   CHOL 148 10/21/2021   HDL 58 10/21/2021   LDLCALC 73 10/21/2021   TRIG 92 10/21/2021   CHOLHDL 2.6 10/21/2021     Updated lab needed at/ before next visit.

## 2023-01-14 NOTE — Assessment & Plan Note (Signed)
resolved 

## 2023-01-14 NOTE — Progress Notes (Signed)
AYNARA URUETA     MRN: [id number]      DOB: 1986/03/28  Chief Complaint  Patient presents with   Follow-up    Back pain, trouble sleeping    HPI Ms. Spilde is here for follow up and re-evaluation of chronic medical conditions, medication management and review of any available recent lab and radiology data.  Preventive health is updated, specifically  Cancer screening and Immunization.   Questions or concerns regarding consultations or procedures which the PT has had in the interim are  addressed. The PT denies any adverse reactions to current medications since the last visit.  There are no new concerns.  There are no specific complaints   ROS Denies recent fever or chills. Denies sinus pressure, nasal congestion, ear pain or sore throat. Denies chest congestion, productive cough or wheezing. Denies chest pains, palpitations and leg swelling Denies abdominal pain, nausea, vomiting,diarrhea or constipation.   Denies dysuria, frequency, hesitancy or incontinence. Denies joint pain, swelling and limitation in mobility. Denies headaches, seizures, numbness, or tingling. Denies depression, anxiety or insomnia. Denies skin break down or rash.   PE  BP 112/76 (BP Location: Right Arm, Patient Position: Sitting, Cuff Size: Large)   Pulse 90   Ht 5\' 7"  (1.702 m)   Wt 249 lb 0.6 oz (113 kg)   SpO2 96%   BMI 39.01 kg/m   Patient alert and oriented and in no cardiopulmonary distress.  HEENT: No facial asymmetry, EOMI,     Neck supple .  Chest: Clear to auscultation bilaterally.  CVS: S1, S2 no murmurs, no S3.Regular rate.  ABD: Soft non tender.   Ext: No edema  MS: Adequate ROM spine, shoulders, hips and knees.  Skin: Intact, no ulcerations or rash noted.  Psych: Good eye contact, normal affect. Memory intact not anxious or depressed appearing.  CNS: CN 2-12 intact, power,  normal throughout.no focal deficits noted.   Assessment & Plan  Vitamin D deficiency Needs  daily supplement Updated lab needed at/ before next visit.   Obesity  Patient re-educated about  the importance of commitment to a  minimum of 150 minutes of exercise per week as able.  The importance of healthy food choices with portion control discussed, as well as eating regularly and within a 12 hour window most days. The need to choose "clean , green" food 50 to 75% of the time is discussed, as well as to make water the primary drink and set a goal of 64 ounces water daily.       01/12/2023    1:06 PM 12/21/2022    1:40 PM 10/07/2022    4:20 PM  Weight /BMI  Weight 249 lb 0.6 oz 249 lb 1.9 oz 238 lb 12.8 oz  Height 5\' 7"  (1.702 m) 5\' 7"  (1.702 m) 5\' 7"  (1.702 m)  BMI 39.01 kg/m2 39.02 kg/m2 37.4 kg/m2      Plantar fasciitis, bilateral resolved  Back spasm Currently increased has been sleeping in many different beds/ mattresses while vacationing, will use muscl relaxant she already has as needed  Migraine without aura and without status migrainosus, not intractable Reports on avg 2 headaches per week has not seen Neurology in over 1 year referral to be placed  Prediabetes Patient educated about the importance of limiting  Carbohydrate intake , the need to commit to daily physical activity for a minimum of 30 minutes , and to commit weight loss. The fact that changes in all these areas will reduce or  eliminate all together the development of diabetes is stressed.      Latest Ref Rng & Units 08/11/2022    2:35 PM 07/09/2022    3:12 PM 04/29/2022    1:51 PM 10/21/2021   11:49 AM 07/01/2021   10:44 AM  Diabetic Labs  HbA1c 4.8 - 5.6 % 5.8    5.8    Chol 100 - 199 mg/dL    284    HDL >13 mg/dL    58    Calc LDL 0 - 99 mg/dL    73    Triglycerides 0 - 149 mg/dL    92    Creatinine 2.44 - 1.00 mg/dL  0.10  2.72  5.36  6.44       01/12/2023    1:06 PM 12/21/2022    1:40 PM 10/07/2022    4:20 PM 08/20/2022    1:44 PM 08/13/2022    1:21 PM 08/13/2022    1:15 PM 08/13/2022   12:45  PM  BP/Weight  Systolic BP 112 111 118 105 132 137 134  Diastolic BP 76 72 86 73 79 79 81  Wt. (Lbs) 249.04 249.12 238.8 237     BMI 39.01 kg/m2 39.02 kg/m2 37.4 kg/m2 37.12 kg/m2          No data to display          Updated lab needed at/ before next visit.   Screening for lipid disorders Hyperlipidemia:Low fat diet discussed and encouraged.   Lipid Panel  Lab Results  Component Value Date   CHOL 148 10/21/2021   HDL 58 10/21/2021   LDLCALC 73 10/21/2021   TRIG 92 10/21/2021   CHOLHDL 2.6 10/21/2021     Updated lab needed at/ before next visit.   RLQ abdominal pain Management by Luisa Dago, states she is told she may have PCO

## 2023-01-14 NOTE — Assessment & Plan Note (Signed)
Management by Luisa Dago, states she is told she may have PCO

## 2023-01-14 NOTE — Assessment & Plan Note (Signed)
Currently increased has been sleeping in many different beds/ mattresses while vacationing, will use muscl relaxant she already has as needed

## 2023-01-14 NOTE — Assessment & Plan Note (Addendum)
Needs daily supplement Updated lab needed at/ before next visit.

## 2023-01-14 NOTE — Assessment & Plan Note (Signed)
  Patient re-educated about  the importance of commitment to a  minimum of 150 minutes of exercise per week as able.  The importance of healthy food choices with portion control discussed, as well as eating regularly and within a 12 hour window most days. The need to choose "clean , green" food 50 to 75% of the time is discussed, as well as to make water the primary drink and set a goal of 64 ounces water daily.       01/12/2023    1:06 PM 12/21/2022    1:40 PM 10/07/2022    4:20 PM  Weight /BMI  Weight 249 lb 0.6 oz 249 lb 1.9 oz 238 lb 12.8 oz  Height 5\' 7"  (1.702 m) 5\' 7"  (1.702 m) 5\' 7"  (1.702 m)  BMI 39.01 kg/m2 39.02 kg/m2 37.4 kg/m2

## 2023-01-14 NOTE — Assessment & Plan Note (Signed)
Reports on avg 2 headaches per week has not seen Neurology in over 1 year referral to be placed

## 2023-01-14 NOTE — Assessment & Plan Note (Signed)
Patient educated about the importance of limiting  Carbohydrate intake , the need to commit to daily physical activity for a minimum of 30 minutes , and to commit weight loss. The fact that changes in all these areas will reduce or eliminate all together the development of diabetes is stressed.      Latest Ref Rng & Units 08/11/2022    2:35 PM 07/09/2022    3:12 PM 04/29/2022    1:51 PM 10/21/2021   11:49 AM 07/01/2021   10:44 AM  Diabetic Labs  HbA1c 4.8 - 5.6 % 5.8    5.8    Chol 100 - 199 mg/dL    161    HDL >09 mg/dL    58    Calc LDL 0 - 99 mg/dL    73    Triglycerides 0 - 149 mg/dL    92    Creatinine 6.04 - 1.00 mg/dL  5.40  9.81  1.91  4.78       01/12/2023    1:06 PM 12/21/2022    1:40 PM 10/07/2022    4:20 PM 08/20/2022    1:44 PM 08/13/2022    1:21 PM 08/13/2022    1:15 PM 08/13/2022   12:45 PM  BP/Weight  Systolic BP 112 111 118 105 132 137 134  Diastolic BP 76 72 86 73 79 79 81  Wt. (Lbs) 249.04 249.12 238.8 237     BMI 39.01 kg/m2 39.02 kg/m2 37.4 kg/m2 37.12 kg/m2          No data to display          Updated lab needed at/ before next visit.

## 2023-01-28 ENCOUNTER — Other Ambulatory Visit: Payer: Self-pay | Admitting: Diagnostic Neuroimaging

## 2023-03-12 ENCOUNTER — Telehealth: Payer: BC Managed Care – PPO | Admitting: Family Medicine

## 2023-03-12 DIAGNOSIS — B9689 Other specified bacterial agents as the cause of diseases classified elsewhere: Secondary | ICD-10-CM | POA: Diagnosis not present

## 2023-03-12 DIAGNOSIS — J019 Acute sinusitis, unspecified: Secondary | ICD-10-CM | POA: Diagnosis not present

## 2023-03-12 MED ORDER — DOXYCYCLINE HYCLATE 100 MG PO TABS: 100.0000 mg | ORAL_TABLET | Freq: Two times a day (BID) | ORAL | 0 refills | Status: AC

## 2023-03-12 NOTE — Progress Notes (Signed)
Virtual Visit Consent   Yvette Conley, you are scheduled for a virtual visit with a [facility] provider today. Just as with appointments in the office, your consent must be obtained to participate. Your consent will be active for this visit and any virtual visit you may have with one of our providers in the next 365 days. If you have a MyChart account, a copy of this consent can be sent to you electronically.  As this is a virtual visit, video technology does not allow for your provider to perform a traditional examination. This may limit your provider's ability to fully assess your condition. If your provider identifies any concerns that need to be evaluated in person or the need to arrange testing (such as labs, EKG, etc.), we will make arrangements to do so. Although advances in technology are sophisticated, we cannot ensure that it will always work on either your end or our end. If the connection with a video visit is poor, the visit may have to be switched to a telephone visit. With either a video or telephone visit, we are not always able to ensure that we have a secure connection.  By engaging in this virtual visit, you consent to the provision of healthcare and authorize for your insurance to be billed (if applicable) for the services provided during this visit. Depending on your insurance coverage, you may receive a charge related to this service.  I need to obtain your verbal consent now. Are you willing to proceed with your visit today? Yvette Conley has provided verbal consent on 03/12/2023 for a virtual visit (video or telephone). Georgana Curio, FNP  Date: 03/12/2023 11:58 AM  Virtual Visit via Video Note   I, Georgana Curio, connected with  Yvette Conley  ([id number], 1986-02-27) on 03/12/23 at 11:45 AM EDT by a video-enabled telemedicine application and verified that I am speaking with the correct person using two identifiers.  Location: Patient: Virtual Visit Location Patient:  Home Provider: Virtual Visit Location Provider: Home Office   I discussed the limitations of evaluation and management by telemedicine and the availability of in person appointments. The patient expressed understanding and agreed to proceed.    History of Present Illness: Yvette Conley is a 37 y.o. who identifies as a female who was assigned female at birth, and is being seen today for sinus congestion, drainage, no fever, sx for 3 weeks worsening. Neg in home covid testing. Marland Kitchen  HPI: HPI  Problems:  Patient Active Problem List   Diagnosis Date Noted   Prediabetes 01/14/2023   Screening for lipid disorders 01/14/2023   Cholelithiasis 07/27/2022   RLQ abdominal pain 07/13/2022   Rectal bleeding 07/13/2022   Pain of right heel 06/20/2022   Allergic sinusitis 03/25/2022   Plantar fasciitis, bilateral 03/25/2022   Morbid obesity (HCC) 10/20/2021   Migraine with aura and without status migrainosus, not intractable 08/26/2021   Fibromyalgia 05/15/2021   Fatigue 05/15/2021   Iron deficiency anemia 05/15/2021   Low libido 05/15/2021   Vitamin D deficiency 05/15/2021   Migraine without aura and without status migrainosus, not intractable 08/08/2020   IBS (irritable bowel syndrome) 02/23/2017   Back spasm 04/09/2014   Irregular menses 06/25/2011   Paroxysmal dystonia 02/08/2011   HEMORRHOIDS-INTERNAL 08/15/2010    Allergies:  Allergies  Allergen Reactions   Latex Rash and Other (See Comments)    Reaction: burning     Sulfa Antibiotics Hives and Other (See Comments)    Torso, chest areas  Topamax [Topiramate] Other (See Comments)    Dystonia/ muscle spasm, 2023   Wound Dressing Adhesive Hives   Augmentin [Amoxicillin-Pot Clavulanate] Rash   Medications:  Current Outpatient Medications:    doxycycline (VIBRA-TABS) 100 MG tablet, Take 1 tablet (100 mg total) by mouth 2 (two) times daily for 10 days., Disp: 20 tablet, Rfl: 0   AJOVY 225 MG/1.5ML SOAJ, ADMINISTER 1.5 ML UNDER THE  SKIN 1 TIME A MONTH, Disp: 1.5 mL, Rfl: 1   cyclobenzaprine (FLEXERIL) 5 MG tablet, TAKE 1 TABLET(5 MG) BY MOUTH THREE TIMES DAILY AS NEEDED FOR MUSCLE SPASMS, Disp: 30 tablet, Rfl: 1   diphenhydrAMINE (BENADRYL) 25 mg capsule, Take 25 mg by mouth every 6 (six) hours as needed., Disp: , Rfl:    norethindrone (MICRONOR) 0.35 MG tablet, Take 1 tablet (0.35 mg total) by mouth daily., Disp: , Rfl:    OVER THE COUNTER MEDICATION, Take 1 tablet by mouth daily. Beets Chews, Disp: , Rfl:    pantoprazole (PROTONIX) 20 MG tablet, Take 1 tablet (20 mg total) by mouth daily., Disp: 30 tablet, Rfl: 0  Observations/Objective: Patient is well-developed, well-nourished in no acute distress.  Resting comfortably  at home.  Head is normocephalic, atraumatic.  No labored breathing.  Speech is clear and coherent with logical content.  Patient is alert and oriented at baseline.    Assessment and Plan: 1. Acute bacterial sinusitis  Increase fluids, humidifier at night, start allegra and flonase, UC if sx persist or worsen.   Follow Up Instructions: I discussed the assessment and treatment plan with the patient. The patient was provided an opportunity to ask questions and all were answered. The patient agreed with the plan and demonstrated an understanding of the instructions.  A copy of instructions were sent to the patient via MyChart unless otherwise noted below.     The patient was advised to call back or seek an in-person evaluation if the symptoms worsen or if the condition fails to improve as anticipated.  Time:  I spent  10 minutes with the patient via telehealth technology discussing the above problems/concerns.    Georgana Curio, FNP

## 2023-03-12 NOTE — Patient Instructions (Signed)

## 2023-03-23 IMAGING — CT CT HEAD W/O CM
4 series · 16 of 47 positions shown, 18 images · non-contrast
Comparison: MRI brain from 12/02/2017

CLINICAL DATA: Intermittent headaches, left eccentric, over the
last several months, lasting about 15 seconds and reportedly
occurring several times daily. Intermittent chest pain.



[Series 2: head wo · axial · 0.43mm/px · z∈[+1038,+1153]mm · 7 of 31 slices shown, 9 images]
[im 4/31  brain]
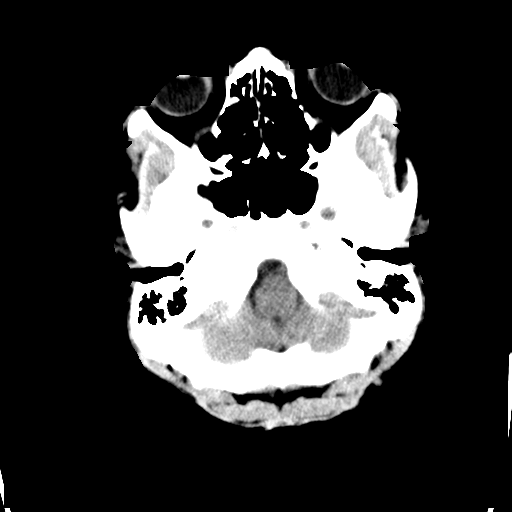
[im 4/31  bone]
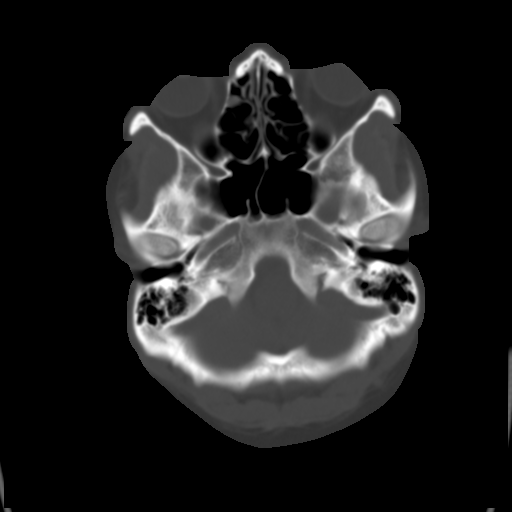
[im 8/31  brain]
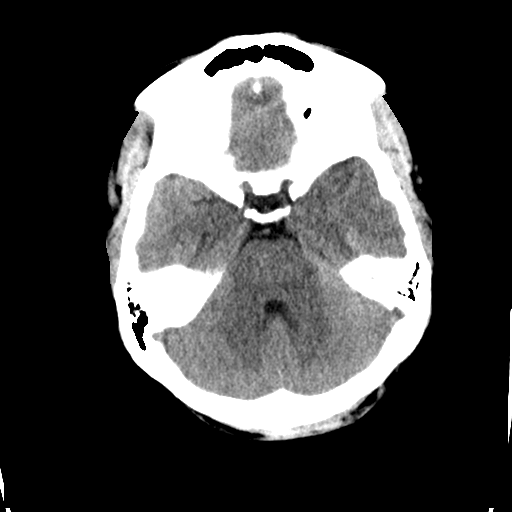
[im 12/31  brain]
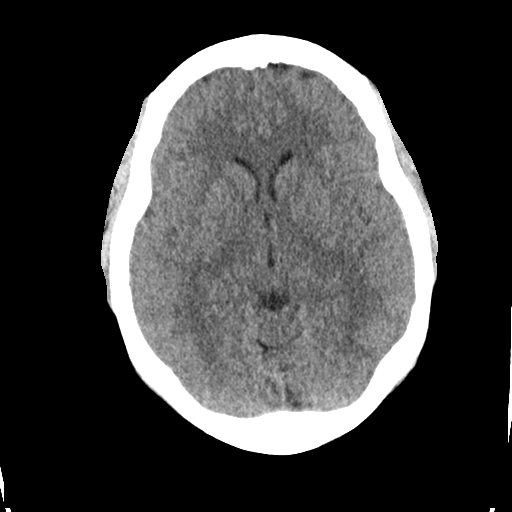
[im 16/31  brain]
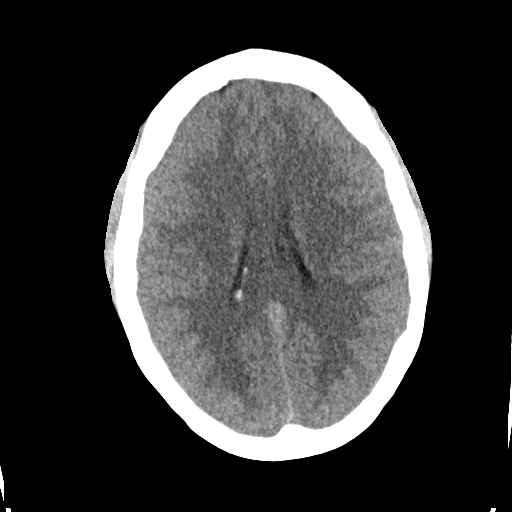
[im 19/31  brain]
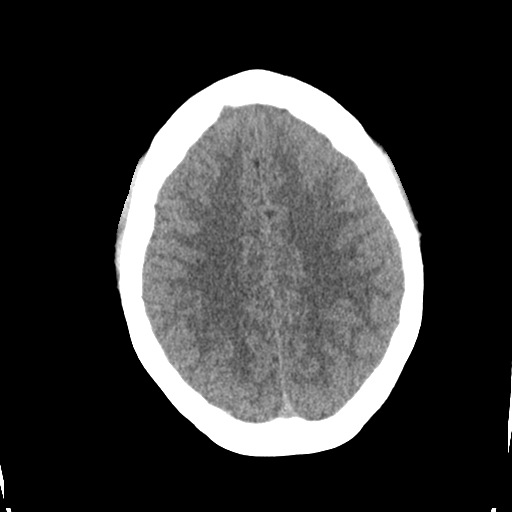
[im 19/31  bone]
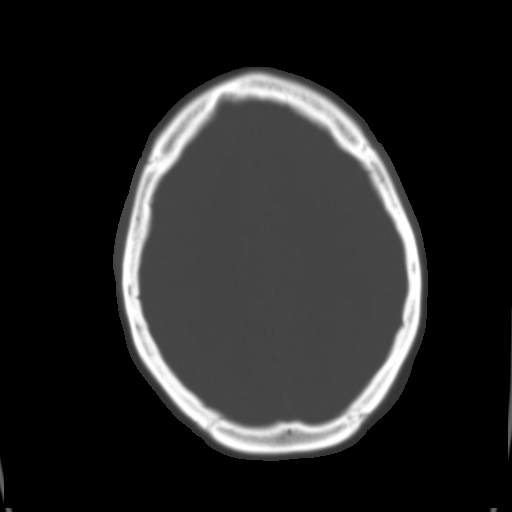
[im 23/31  brain]
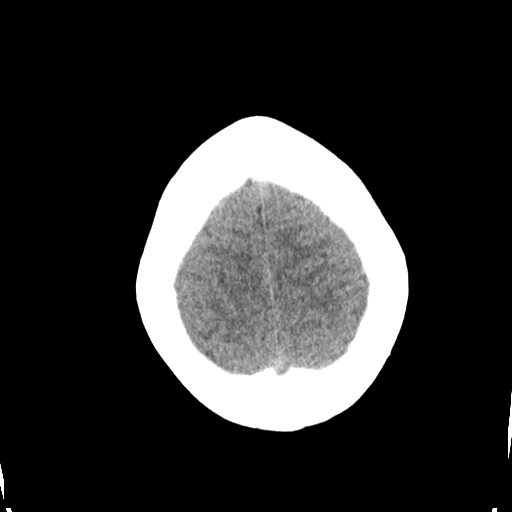
[im 27/31  brain]
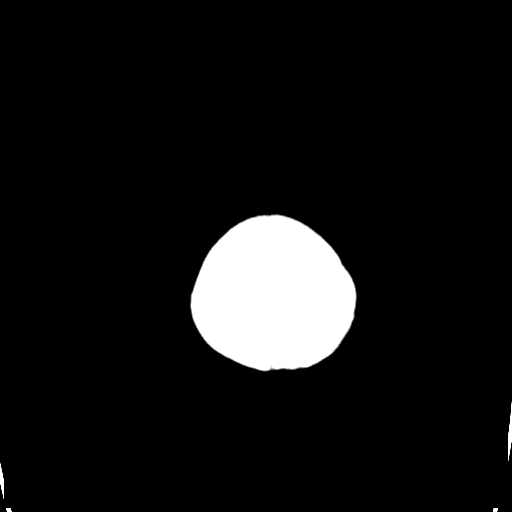

[Series 3: head bone · axial · 0.43mm/px · z∈[+1037,+1069]mm · 3 of 78 slices shown]
[im 8/78  bone]
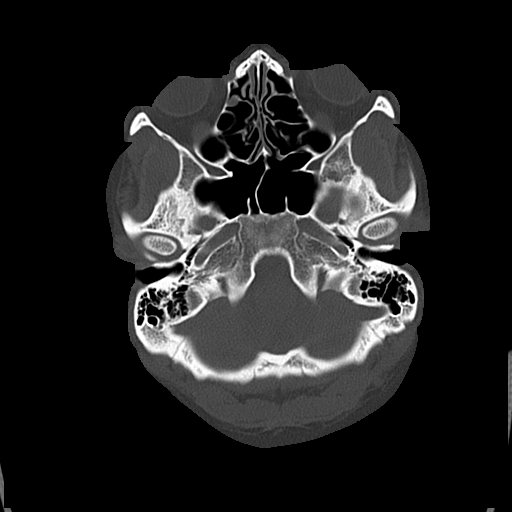
[im 16/78  bone]
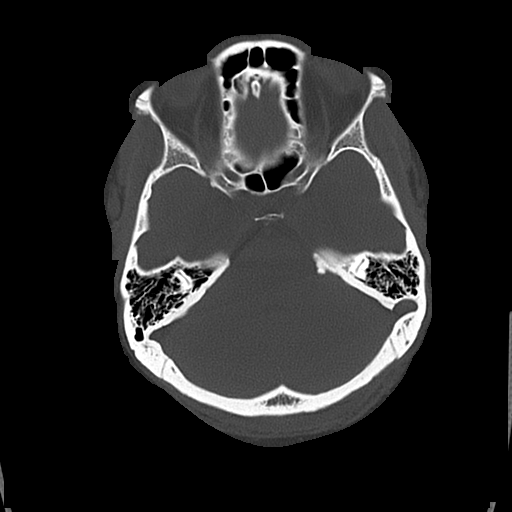
[im 24/78  bone]
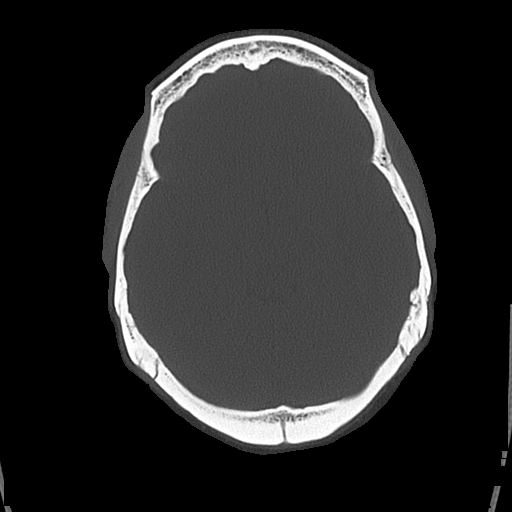

[Series 4: coronal soft · coronal · 0.30mm/px · 3 of 67 slices shown]
[im 23/67  brain]
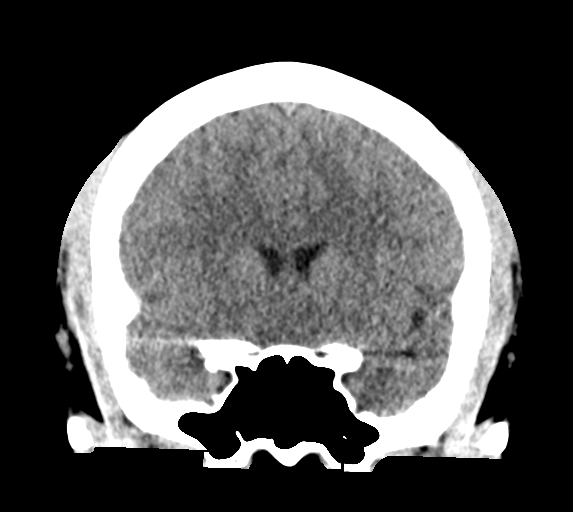
[im 30/67  brain]
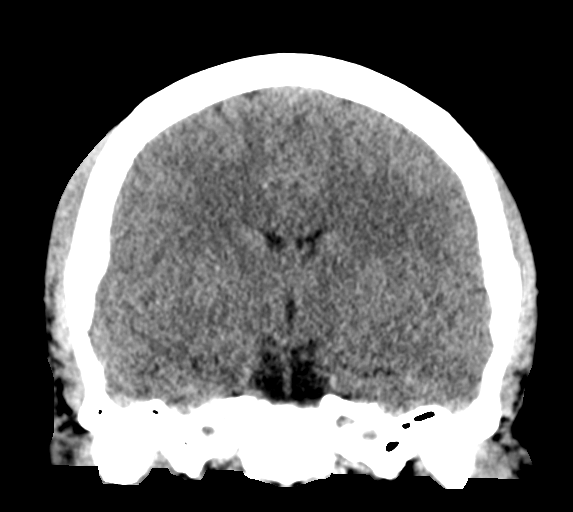
[im 37/67  brain]
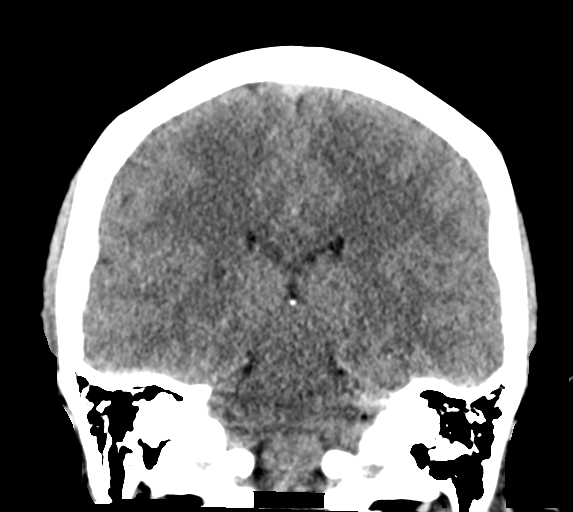

[Series 5: sagittal soft · sagittal · 0.30mm/px · 3 of 57 slices shown]
[im 19/57  brain]
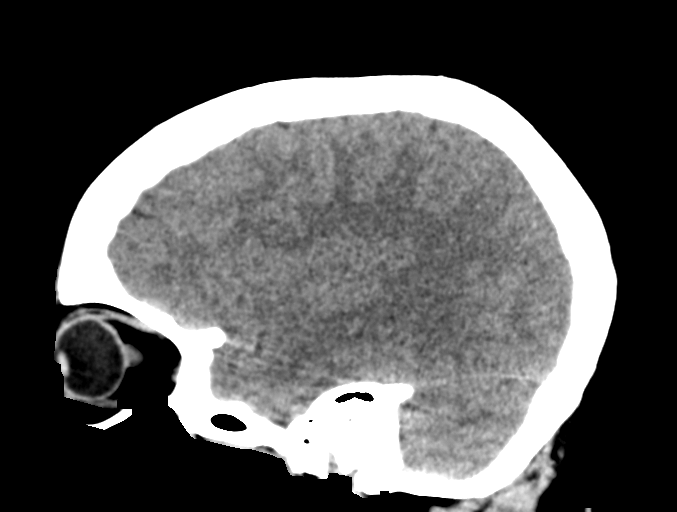
[im 29/57  brain]
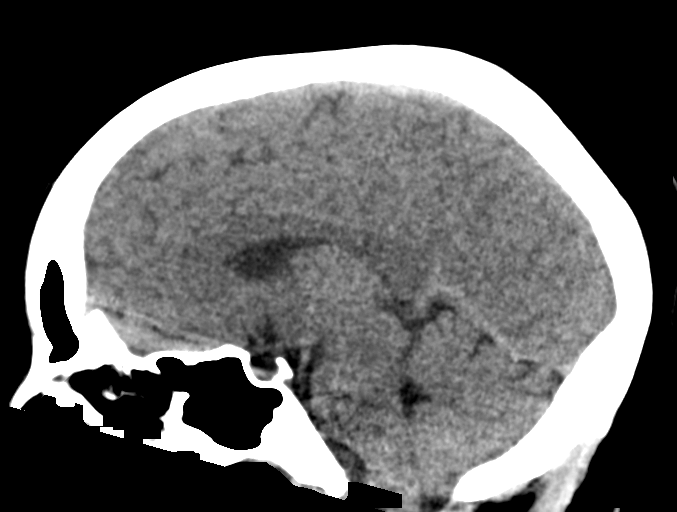
[im 38/57  brain]
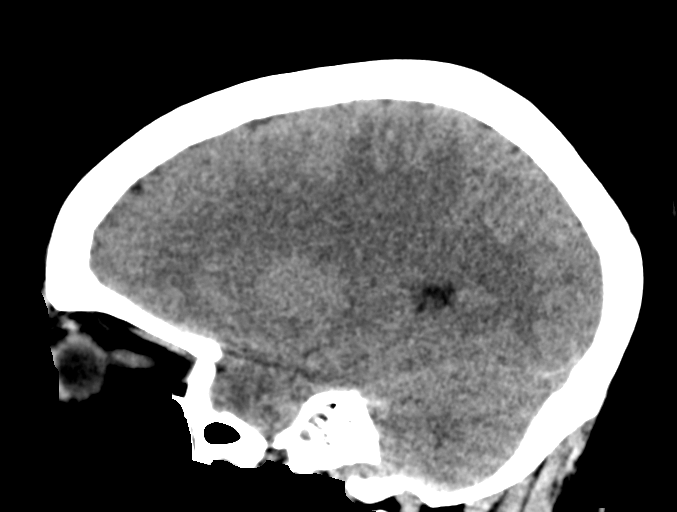

[16 of 47 positions shown; findings below may reference images not displayed]

FINDINGS: Brain: The brainstem, cerebellum, cerebral peduncles, thalami, basal
ganglia, basilar cisterns, and ventricular system appear within
normal limits. No intracranial hemorrhage, mass lesion, or acute
CVA.

Vascular: Unremarkable

Skull: Unremarkable

Sinuses/Orbits: Unremarkable

Other: No supplemental non-categorized findings.
IMPRESSION: 1.  No significant abnormality identified.

## 2023-04-06 ENCOUNTER — Other Ambulatory Visit: Payer: Self-pay

## 2023-04-06 DIAGNOSIS — D509 Iron deficiency anemia, unspecified: Secondary | ICD-10-CM

## 2023-04-06 LAB — CBC
Hematocrit: 35 % (ref 34.0–46.6)
Hemoglobin: 11.3 g/dL (ref 11.1–15.9)
MCH: 30.2 pg (ref 26.6–33.0)
MCHC: 32.3 g/dL (ref 31.5–35.7)
MCV: 94 fL (ref 79–97)
Platelets: 366 10*3/uL (ref 150–450)
RBC: 3.74 x10E6/uL — ABNORMAL LOW (ref 3.77–5.28)
RDW: 12.2 % (ref 11.7–15.4)
WBC: 7.4 10*3/uL (ref 3.4–10.8)

## 2023-04-06 LAB — CMP14+EGFR
ALT: 13 [IU]/L (ref 0–32)
AST: 11 [IU]/L (ref 0–40)
Albumin: 4.4 g/dL (ref 3.9–4.9)
Alkaline Phosphatase: 56 [IU]/L (ref 44–121)
BUN/Creatinine Ratio: 18 (ref 9–23)
BUN: 11 mg/dL (ref 6–20)
Bilirubin Total: 0.4 mg/dL (ref 0.0–1.2)
CO2: 24 mmol/L (ref 20–29)
Calcium: 9.5 mg/dL (ref 8.7–10.2)
Chloride: 105 mmol/L (ref 96–106)
Creatinine, Ser: 0.61 mg/dL (ref 0.57–1.00)
Globulin, Total: 2.9 g/dL (ref 1.5–4.5)
Glucose: 73 mg/dL (ref 70–99)
Potassium: 4.5 mmol/L (ref 3.5–5.2)
Sodium: 141 mmol/L (ref 134–144)
Total Protein: 7.3 g/dL (ref 6.0–8.5)
eGFR: 120 mL/min/{1.73_m2} (ref >59)

## 2023-04-06 LAB — LIPID PANEL
Chol/HDL Ratio: 2.2 {ratio} (ref 0.0–4.4)
Cholesterol, Total: 116 mg/dL (ref 100–199)
HDL: 53 mg/dL (ref >39)
LDL Chol Calc (NIH): 51 mg/dL (ref 0–99)
Triglycerides: 52 mg/dL (ref 0–149)
VLDL Cholesterol Cal: 12 mg/dL (ref 5–40)

## 2023-04-06 LAB — HEMOGLOBIN A1C
Est. average glucose Bld gHb Est-mCnc: 108 mg/dL
Hgb A1c MFr Bld: 5.4 % (ref 4.8–5.6)

## 2023-04-06 LAB — VITAMIN D 25 HYDROXY (VIT D DEFICIENCY, FRACTURES): Vit D, 25-Hydroxy: 25.4 ng/mL — ABNORMAL LOW (ref 30.0–100.0)

## 2023-04-06 LAB — TSH: TSH: 0.897 u[IU]/mL (ref 0.450–4.500)

## 2023-04-08 LAB — SPECIMEN STATUS REPORT

## 2023-04-08 LAB — VITAMIN B12: Vitamin B-12: 528 pg/mL (ref 232–1245)

## 2023-04-08 LAB — FERRITIN: Ferritin: 98 ng/mL (ref 15–150)

## 2023-04-08 LAB — IRON: Iron: 62 ug/dL (ref 27–159)

## 2023-04-19 ENCOUNTER — Encounter: Payer: Self-pay | Admitting: Diagnostic Neuroimaging

## 2023-04-19 ENCOUNTER — Ambulatory Visit: Payer: BC Managed Care – PPO | Admitting: Diagnostic Neuroimaging

## 2023-04-19 VITALS — BP 109/75 | HR 88 | Ht 67.0 in | Wt 223.0 lb

## 2023-04-19 DIAGNOSIS — G43109 Migraine with aura, not intractable, without status migrainosus: Secondary | ICD-10-CM | POA: Diagnosis not present

## 2023-04-19 MED ORDER — NURTEC 75 MG PO TBDP: 75.0000 mg | ORAL_TABLET | Freq: Every day | ORAL | 6 refills | Status: DC | PRN

## 2023-04-19 MED ORDER — AJOVY 225 MG/1.5ML ~~LOC~~ SOAJ: 225.0000 mg | SUBCUTANEOUS | 4 refills | Status: DC
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
## 2023-04-28 ENCOUNTER — Ambulatory Visit: Payer: BC Managed Care – PPO | Admitting: Family Medicine

## 2023-04-28 DIAGNOSIS — G43109 Migraine with aura, not intractable, without status migrainosus: Secondary | ICD-10-CM | POA: Diagnosis not present

## 2023-04-28 DIAGNOSIS — M722 Plantar fascial fibromatosis: Secondary | ICD-10-CM

## 2023-04-28 DIAGNOSIS — L819 Disorder of pigmentation, unspecified: Secondary | ICD-10-CM

## 2023-04-28 MED ORDER — MELOXICAM 15 MG PO TABS: 15.0000 mg | ORAL_TABLET | Freq: Every day | ORAL | 1 refills | Status: DC

## 2023-04-28 MED ORDER — TIRZEPATIDE-WEIGHT MANAGEMENT 7.5 MG/0.5ML ~~LOC~~ SOAJ: 7.5000 mg | SUBCUTANEOUS | 3 refills | Status: DC

## 2023-04-28 NOTE — Patient Instructions (Addendum)
F/U in  3 months, call if you need me sooner  Zepbound  is prescribed at  7.5 mg dose ' Lightening of skin on ankle  not a major concern, if skin under heel keeps re opening , blistering that is a concern  Thanks for choosing [facility], we consider it a privelige to serve you.

## 2023-05-03 DIAGNOSIS — L819 Disorder of pigmentation, unspecified: Secondary | ICD-10-CM | POA: Insufficient documentation

## 2023-05-03 NOTE — Assessment & Plan Note (Signed)
Very well controlled on current regime and is managed by Neurology

## 2023-05-03 NOTE — Assessment & Plan Note (Signed)
improved

## 2023-05-03 NOTE — Assessment & Plan Note (Signed)
Affected area is site of steroid injections, I advised this was not uncommon, and not a cause for major concern

## 2023-05-03 NOTE — Progress Notes (Signed)
   Yvette Conley     MRN: [id number]      DOB: 07/16/1985  Chief Complaint  Patient presents with   Follow-up    Follow up, ankle pain     HPI Yvette Conley is here for follow up and re-evaluation of chronic medical conditions, medication management and review of any available recent lab and radiology data.  Preventive health is updated, specifically  Cancer screening and Immunization.   Has seen neurology since last visit , excellent response to current migraine medication Apart from lifestyle change , " had access " to [facility] [place] with excellent weight loss , wants to continue this and is asking fro rx, uncertain if ins will cover, but will be changing in the New year and hoping for coverage Has concerns re depigmentation of skin in area where she had received steroid injection for ankle pain, also reports that at the heel the skin would open and re open wit infection, none recently  ROS Denies recent fever or chills. Denies sinus pressure, nasal congestion, ear pain or sore throat. Denies chest congestion, productive cough or wheezing. Denies chest pains, palpitations and leg swelling Denies abdominal pain, nausea, vomiting,diarrhea or constipation.   Denies dysuria, frequency, hesitancy or incontinence.  Denies uncontrolled headaches, seizures, numbness, or tingling. Denies depression, anxiety or insomnia. Marland Kitchen   PE  BP 113/78 (BP Location: Right Arm, Patient Position: Sitting, Cuff Size: Large)   Pulse 88   Ht 5\' 7"  (1.702 m)   Wt 221 lb 1.3 oz (100.3 kg)   SpO2 97%   BMI 34.63 kg/m   Patient alert and oriented and in no cardiopulmonary distress.  HEENT: No facial asymmetry, EOMI,     Neck supple .  Chest: Clear to auscultation bilaterally.  CVS: S1, S2 no murmurs, no S3.Regular rate.     Ext: No edema  MS: Adequate ROM spine, shoulders, hips and knees.  Skin: Intact, slight hypopigmentation noted in area of concern at previous injectio site, no skin breakdown or open  skin on heel.  Psych: Good eye contact, normal affect. Memory intact not anxious or depressed appearing.  CNS: CN 2-12 intact, power,  normal throughout.no focal deficits noted.   Assessment & Plan  Morbid obesity (HCC) Great improvement , zepbound prescribed wih 3 month follow up  Patient re-educated about  the importance of commitment to a  minimum of 150 minutes of exercise per week as able.  The importance of healthy food choices with portion control discussed, as well as eating regularly and within a 12 hour window most days. The need to choose "clean , green" food 50 to 75% of the time is discussed, as well as to make water the primary drink and set a goal of 64 ounces water daily.       04/28/2023    4:28 PM 04/19/2023    2:53 PM 01/12/2023    1:06 PM  Weight /BMI  Weight 221 lb 1.3 oz 223 lb 249 lb 0.6 oz  Height 5\' 7"  (1.702 m) 5\' 7"  (1.702 m) 5\' 7"  (1.702 m)  BMI 34.63 kg/m2 34.93 kg/m2 39.01 kg/m2      Migraine with aura and without status migrainosus, not intractable Very well controlled on current regime and is managed by Neurology  Plantar fasciitis, bilateral improved  Depigmentation of skin Affected area is site of steroid injections, I advised this was not uncommon, and not a cause for major concern

## 2023-05-03 NOTE — Assessment & Plan Note (Signed)
Great improvement , zepbound prescribed wih 3 month follow up  Patient re-educated about  the importance of commitment to a  minimum of 150 minutes of exercise per week as able.  The importance of healthy food choices with portion control discussed, as well as eating regularly and within a 12 hour window most days. The need to choose "clean , green" food 50 to 75% of the time is discussed, as well as to make water the primary drink and set a goal of 64 ounces water daily.       04/28/2023    4:28 PM 04/19/2023    2:53 PM 01/12/2023    1:06 PM  Weight /BMI  Weight 221 lb 1.3 oz 223 lb 249 lb 0.6 oz  Height 5\' 7"  (1.702 m) 5\' 7"  (1.702 m) 5\' 7"  (1.702 m)  BMI 34.63 kg/m2 34.93 kg/m2 39.01 kg/m2

## 2023-05-19 ENCOUNTER — Ambulatory Visit: Payer: BC Managed Care – PPO | Admitting: Family Medicine

## 2023-07-28 ENCOUNTER — Other Ambulatory Visit: Payer: Self-pay | Admitting: Family Medicine

## 2023-07-29 ENCOUNTER — Ambulatory Visit: Payer: 59 | Admitting: Family Medicine

## 2023-07-29 ENCOUNTER — Encounter: Payer: Self-pay | Admitting: Family Medicine

## 2023-07-29 VITALS — BP 118/78 | HR 83 | Ht 67.0 in | Wt 218.1 lb

## 2023-07-29 DIAGNOSIS — R42 Dizziness and giddiness: Secondary | ICD-10-CM | POA: Diagnosis not present

## 2023-07-29 DIAGNOSIS — H9311 Tinnitus, right ear: Secondary | ICD-10-CM | POA: Diagnosis not present

## 2023-07-29 DIAGNOSIS — G43109 Migraine with aura, not intractable, without status migrainosus: Secondary | ICD-10-CM

## 2023-07-29 MED ORDER — ZEPBOUND 10 MG/0.5ML ~~LOC~~ SOAJ: 10.0000 mg | SUBCUTANEOUS | 2 refills | Status: DC

## 2023-07-29 MED ORDER — MECLIZINE HCL 12.5 MG PO TABS: 12.5000 mg | ORAL_TABLET | Freq: Three times a day (TID) | ORAL | 0 refills | Status: AC | PRN

## 2023-07-29 NOTE — Progress Notes (Unsigned)
   Yvette Conley     MRN: [id number]      DOB: Feb 15, 1986  Chief Complaint  Patient presents with   Follow-up    Follow up, dizziness with headaches, neck pain requesting zepbound with new insurance    HPI Yvette Conley is here for follow up and re-evaluation of chronic medical conditions, medication management and review of any available recent lab and radiology data.  Preventive health is updated, specifically  Cancer screening and Immunization.   Questions or concerns regarding consultations or procedures which the PT has had in the interim are  addressed. The PT denies any adverse reactions to current medications since the last visit.   3 month  h/o intermittent light headeness and vertigo,  specially when reaching up or bending down, ringing in right ear, and nausea with vertigo at times, worse In past  4 weeks, waa having episodes up to 5 days per week initally now down to 2 Wants to continue weight loss med as she has had great success with no significant s/e ROS Denies recent fever or chills. Denies sinus pressure, nasal congestion, ear pain or sore throat. Denies chest congestion, productive cough or wheezing. Denies chest pains, palpitations and leg swelling Denies abdominal pain, nausea, vomiting,diarrhea or constipation.   Denies dysuria, frequency, hesitancy or incontinence. Denies joint pain, swelling and limitation in mobility. Denies headaches, seizures, numbness, or tingling. Denies depression, anxiety or insomnia. Denies skin break down or rash.   PE  BP 118/78 (BP Location: Right Arm, Patient Position: Sitting, Cuff Size: Large)   Pulse 83   Ht 5\' 7"  (1.702 m)   Wt 218 lb 1.9 oz (98.9 kg)   SpO2 95%   BMI 34.16 kg/m   Patient alert and oriented and in no cardiopulmonary distress.  HEENT: No facial asymmetry, EOMI,     Neck supple .  Chest: Clear to auscultation bilaterally.  CVS: S1, S2 no murmurs, no S3.Regular rate.  Ext: No edema  MS: Adequate ROM  spine, shoulders, hips and knees.  Skin: Intact, no ulcerations or rash noted.  Psych: Good eye contact, normal affect. Memory intact not anxious or depressed appearing.  CNS: CN 2-12 intact, power,  normal throughout.no focal deficits noted.   Assessment & Plan  Vertigo 3 monht history intermittent, refer ENT  Migraine with aura and without status migrainosus, not intractable Improved ,  managed by Neurology  Morbid obesity [facility])  Patient re-educated about  the importance of commitment to a  minimum of 150 minutes of exercise per week as able.  The importance of healthy food choices with portion control discussed, as well as eating regularly and within a 12 hour window most days. The need to choose "clean , green" food 50 to 75% of the time is discussed, as well as to make water the primary drink and set a goal of 64 ounces water daily.       07/29/2023    3:51 PM 04/28/2023    4:28 PM 04/19/2023    2:53 PM  Weight /BMI  Weight 218 lb 1.9 oz 221 lb 1.3 oz 223 lb  Height 5\' 7"  (1.702 m) 5\' 7"  (1.702 m) 5\' 7"  (1.702 m)  BMI 34.16 kg/m2 34.63 kg/m2 34.93 kg/m2    Great weight loss inc dose zepbound to 10 mg weekly  Tinnitus aurium, right 3 month h/o tinnitus, refer to ENT

## 2023-07-29 NOTE — Patient Instructions (Addendum)
F/u in 3 months, call if you need me sooner  You are referred to ENt re ringing in right ear and dizziness  Meclizine is prescribed for as needed use for doizziness  Pls send a message re zepbound , I have inc the dose  Weightl oss goal of 8 to 12 pounds in 12 weeks  Thanks for choosing [facility], we consider it a privelige to serve you.

## 2023-07-30 ENCOUNTER — Encounter: Payer: Self-pay | Admitting: Family Medicine

## 2023-07-30 DIAGNOSIS — H9311 Tinnitus, right ear: Secondary | ICD-10-CM | POA: Insufficient documentation

## 2023-07-30 DIAGNOSIS — R42 Dizziness and giddiness: Secondary | ICD-10-CM | POA: Insufficient documentation

## 2023-07-30 NOTE — Assessment & Plan Note (Signed)
3 month h/o tinnitus, refer to ENT

## 2023-07-30 NOTE — Assessment & Plan Note (Signed)
3 monht history intermittent, refer ENT

## 2023-07-30 NOTE — Assessment & Plan Note (Signed)
Improved ,  managed by Neurology

## 2023-07-30 NOTE — Assessment & Plan Note (Signed)
  Patient re-educated about  the importance of commitment to a  minimum of 150 minutes of exercise per week as able.  The importance of healthy food choices with portion control discussed, as well as eating regularly and within a 12 hour window most days. The need to choose "clean , green" food 50 to 75% of the time is discussed, as well as to make water the primary drink and set a goal of 64 ounces water daily.       07/29/2023    3:51 PM 04/28/2023    4:28 PM 04/19/2023    2:53 PM  Weight /BMI  Weight 218 lb 1.9 oz 221 lb 1.3 oz 223 lb  Height 5\' 7"  (1.702 m) 5\' 7"  (1.702 m) 5\' 7"  (1.702 m)  BMI 34.16 kg/m2 34.63 kg/m2 34.93 kg/m2    Great weight loss inc dose zepbound to 10 mg weekly

## 2023-08-05 ENCOUNTER — Other Ambulatory Visit: Payer: Self-pay

## 2023-08-05 MED ORDER — ZEPBOUND 10 MG/0.5ML ~~LOC~~ SOAJ: 10.0000 mg | SUBCUTANEOUS | 2 refills | Status: DC

## 2023-08-05 NOTE — Telephone Encounter (Signed)
Code sent to pharmacy.

## 2023-08-27 ENCOUNTER — Encounter: Payer: Self-pay | Admitting: Family Medicine

## 2023-08-27 DIAGNOSIS — M25472 Effusion, left ankle: Secondary | ICD-10-CM

## 2023-08-27 DIAGNOSIS — G8929 Other chronic pain: Secondary | ICD-10-CM

## 2023-09-10 ENCOUNTER — Emergency Department (HOSPITAL_BASED_OUTPATIENT_CLINIC_OR_DEPARTMENT_OTHER)

## 2023-09-10 ENCOUNTER — Emergency Department (HOSPITAL_BASED_OUTPATIENT_CLINIC_OR_DEPARTMENT_OTHER): Admission: EM | Admit: 2023-09-10 | Discharge: 2023-09-10 | Disposition: A | Discharge status: Home / Self Care

## 2023-09-10 ENCOUNTER — Other Ambulatory Visit: Payer: Self-pay

## 2023-09-10 ENCOUNTER — Encounter (HOSPITAL_BASED_OUTPATIENT_CLINIC_OR_DEPARTMENT_OTHER): Payer: Self-pay | Admitting: Emergency Medicine

## 2023-09-10 ENCOUNTER — Emergency Department (HOSPITAL_BASED_OUTPATIENT_CLINIC_OR_DEPARTMENT_OTHER): Admitting: Radiology

## 2023-09-10 DIAGNOSIS — Z91040 Latex allergy status: Secondary | ICD-10-CM | POA: Insufficient documentation

## 2023-09-10 DIAGNOSIS — R0789 Other chest pain: Secondary | ICD-10-CM | POA: Diagnosis present

## 2023-09-10 DIAGNOSIS — R791 Abnormal coagulation profile: Secondary | ICD-10-CM | POA: Diagnosis not present

## 2023-09-10 DIAGNOSIS — R0989 Other specified symptoms and signs involving the circulatory and respiratory systems: Secondary | ICD-10-CM

## 2023-09-10 LAB — CBC
HCT: 34.8 % — ABNORMAL LOW (ref 36.0–46.0)
Hemoglobin: 11.5 g/dL — ABNORMAL LOW (ref 12.0–15.0)
MCH: 30.6 pg (ref 26.0–34.0)
MCHC: 33 g/dL (ref 30.0–36.0)
MCV: 92.6 fL (ref 80.0–100.0)
Platelets: 316 10*3/uL (ref 150–400)
RBC: 3.76 MIL/uL — ABNORMAL LOW (ref 3.87–5.11)
RDW: 12.4 % (ref 11.5–15.5)
WBC: 8.5 10*3/uL (ref 4.0–10.5)
nRBC: 0 % (ref 0.0–0.2)

## 2023-09-10 LAB — PREGNANCY, URINE: Preg Test, Ur: NEGATIVE

## 2023-09-10 LAB — BASIC METABOLIC PANEL WITH GFR
Anion gap: 7 (ref 5–15)
BUN: 13 mg/dL (ref 6–20)
CO2: 26 mmol/L (ref 22–32)
Calcium: 9.2 mg/dL (ref 8.9–10.3)
Chloride: 104 mmol/L (ref 98–111)
Creatinine, Ser: 0.62 mg/dL (ref 0.44–1.00)
GFR, Estimated: >60 mL/min (ref >60)
Glucose, Bld: 95 mg/dL (ref 70–99)
Potassium: 3.7 mmol/L (ref 3.5–5.1)
Sodium: 137 mmol/L (ref 135–145)

## 2023-09-10 LAB — TROPONIN I (HIGH SENSITIVITY): Troponin I (High Sensitivity): <2 ng/L (ref <18)

## 2023-09-10 LAB — D-DIMER, QUANTITATIVE: D-Dimer, Quant: 0.52 ug{FEU}/mL — ABNORMAL HIGH (ref 0.00–0.50)

## 2023-09-10 MED ORDER — IPRATROPIUM-ALBUTEROL 0.5-2.5 (3) MG/3ML IN SOLN
3.0000 mL | Freq: Once | RESPIRATORY_TRACT | Status: AC
  Administered 2023-09-10: 3 mL via RESPIRATORY_TRACT
  Filled 2023-09-10: qty 3

## 2023-09-10 MED ORDER — DOXYCYCLINE HYCLATE 100 MG PO CAPS: 100.0000 mg | ORAL_CAPSULE | Freq: Two times a day (BID) | ORAL | 0 refills | Status: DC

## 2023-09-10 MED ORDER — IOHEXOL 350 MG/ML SOLN
75.0000 mL | Freq: Once | INTRAVENOUS | Status: AC | PRN
  Administered 2023-09-10: 75 mL via INTRAVENOUS

## 2023-09-10 MED ORDER — ALBUTEROL SULFATE HFA 108 (90 BASE) MCG/ACT IN AERS: 1.0000 | INHALATION_SPRAY | Freq: Four times a day (QID) | RESPIRATORY_TRACT | 0 refills | Status: DC | PRN

## 2023-09-10 NOTE — Discharge Instructions (Addendum)
 Your workup today was reassuring.  Please start the doxycycline in 2 days if you are not improving.  Try not to take this if you are getting better as your symptoms still may be due to viral infection.  Use 2 puffs of the albuterol every 4 hours over the next 24 hours.  Follow-up with your doctor.  Return to the ER for worsening symptoms.

## 2023-09-10 NOTE — ED Provider Notes (Addendum)
  EMERGENCY DEPARTMENT AT [facility] Provider Note   CSN: [id number] Arrival date & time: 09/10/23  1210     History  Chief Complaint  Patient presents with   Chest Pain    Yvette Conley is a 38 y.o. female.  38 year old female with past medical history of migraine headaches who is on OCPs presenting to the emergency department today with an episode of severe shortness of breath last night.  The patient states that she has been coughing with some mucus past few days.  She states last night she was having pleuritic chest pain mostly on the right side.  She states that she was very short of breath last night and this continued into the morning.  She reports this has improved but she is still feeling short of breath.  She denies any associated fevers.  She came to the ER today for further evaluation regarding this.  States that she initially started feeling unwell with the cough on Monday.  She denies any hemoptysis or leg swelling.   Chest Pain      Home Medications Prior to Admission medications   Medication Sig Start Date End Date Taking? Authorizing Provider  albuterol (VENTOLIN HFA) 108 (90 Base) MCG/ACT inhaler Inhale 1-2 puffs into the lungs every 6 (six) hours as needed for wheezing or shortness of breath. 09/10/23  Yes Durwin Glaze, MD  doxycycline (VIBRAMYCIN) 100 MG capsule Take 1 capsule (100 mg total) by mouth 2 (two) times daily. 09/10/23  Yes Durwin Glaze, MD  cyclobenzaprine (FLEXERIL) 5 MG tablet TAKE 1 TABLET(5 MG) BY MOUTH THREE TIMES DAILY AS NEEDED FOR MUSCLE SPASMS 12/01/22   Kerri Perches, MD  Fremanezumab-vfrm (AJOVY) 225 MG/1.5ML SOAJ Inject 225 mg into the skin every 30 (thirty) days. 04/19/23   Penumalli, Glenford Bayley, MD  meclizine (ANTIVERT) 12.5 MG tablet Take 1 tablet (12.5 mg total) by mouth 3 (three) times daily as needed for dizziness. 07/29/23   Kerri Perches, MD  meloxicam (MOBIC) 15 MG tablet TAKE 1 TABLET(15 MG) BY MOUTH  DAILY 07/28/23   Kerri Perches, MD  norethindrone (ORTHO MICRONOR) 0.35 MG tablet Take 1 tablet (0.35 mg total) by mouth daily. 07/29/23   Kerri Perches, MD  tirzepatide (ZEPBOUND) 10 MG/0.5ML Pen Inject 10 mg into the skin once a week. Dx: E66.01 08/05/23   Kerri Perches, MD      Allergies    Latex, Sulfa antibiotics, Topamax [topiramate], Wound dressing adhesive, and Augmentin [amoxicillin-pot clavulanate]    Review of Systems   Review of Systems  Cardiovascular:  Positive for chest pain.  All other systems reviewed and are negative.   Physical Exam Updated Vital Signs BP 111/75   Pulse 89   Temp 97.8 F (36.6 C)   Resp 16   Wt 97.1 kg   LMP 08/26/2023   SpO2 100%   BMI 33.52 kg/m  Physical Exam Vitals and nursing note reviewed.   Gen: NAD Eyes: PERRL, EOMI HEENT: no oropharyngeal swelling Neck: trachea midline Resp: clear to auscultation bilaterally Card: RRR, no murmurs, rubs, or gallops Abd: nontender, nondistended Extremities: no calf tenderness, no edema Vascular: 2+ radial pulses bilaterally, 2+ DP pulses bilaterally Skin: no rashes Psyc: acting appropriately   ED Results / Procedures / Treatments   Labs (all labs ordered are listed, but only abnormal results are displayed) Labs Reviewed  CBC - Abnormal; Notable for the following components:      Result Value  RBC 3.76 (*)    Hemoglobin 11.5 (*)    HCT 34.8 (*)    All other components within normal limits  D-DIMER, QUANTITATIVE - Abnormal; Notable for the following components:   D-Dimer, Quant 0.52 (*)    All other components within normal limits  BASIC METABOLIC PANEL WITH GFR  PREGNANCY, URINE  TROPONIN I (HIGH SENSITIVITY)  TROPONIN I (HIGH SENSITIVITY)    EKG EKG Interpretation Date/Time:  Friday September 10 2023 12:38:23 EDT Ventricular Rate:  95 PR Interval:  164 QRS Duration:  82 QT Interval:  360 QTC Calculation: 452 R Axis:   60  Text Interpretation: Normal sinus  rhythm Normal ECG When compared with ECG of 11-Aug-2022 14:39, No significant change was found Confirmed by Beckey Downing [phone]) on 09/10/2023 12:43:19 PM  Radiology DG Chest 2 View Result Date: 09/10/2023 CLINICAL DATA:  Cough and chest pain. EXAM: CHEST - 2 VIEW COMPARISON:  Chest x-ray dated July 01, 2021. FINDINGS: The heart size and mediastinal contours are within normal limits. Both lungs are clear. The visualized skeletal structures are unremarkable. IMPRESSION: No active cardiopulmonary disease. Electronically Signed   By: Obie Dredge M.D.   On: 09/10/2023 13:56    Procedures Procedures    Medications Ordered in ED Medications  ipratropium-albuterol (DUONEB) 0.5-2.5 (3) MG/3ML nebulizer solution 3 mL (3 mLs Nebulization Given 09/10/23 1452)    ED Course/ Medical Decision Making/ A&P                                 Medical Decision Making 38 year old female presenting to the emergency department today with cough and sinus congestion for the past 5 days.  The patient is also reporting an episode of severe dyspnea last night with pleuritic chest pain.  Her chest x-ray interpreted by me shows no acute infiltrates, no pulmonary edema, no pneumothorax.  Her initial labs are reassuring so this does not appear to be consistent with myocarditis or pericarditis based on her EKG and labs.  I will add on a D-dimer here given the pleuritic chest pain and with her being on OCPs.  Will give her a DuoNeb here as well.  I am good if her workup is reassuring that she may be discharged.  He states that her sinus symptoms do seem to be worsening over the past day or 2 as well.  I will give her a prescription to start as needed in 2 days if her symptoms have not improved.  The patient's D-dimer is elevated.  CT angiogram is negative.  The patient is discharged with return precautions.  Amount and/or Complexity of Data Reviewed Labs: ordered. Radiology: ordered.  Risk Prescription drug  management.           Final Clinical Impression(s) / ED Diagnoses Final diagnoses:  Chest congestion    Rx / DC Orders ED Discharge Orders          Ordered    doxycycline (VIBRAMYCIN) 100 MG capsule  2 times daily        09/10/23 1522    albuterol (VENTOLIN HFA) 108 (90 Base) MCG/ACT inhaler  Every 6 hours PRN        09/10/23 1522              Durwin Glaze, MD 09/10/23 1523    Durwin Glaze, MD 09/10/23 [phone]

## 2023-09-10 NOTE — ED Notes (Signed)
Reviewed discharge instructions, medications, and home care with pt. Pt verbalized understanding and had no further questions. Pt exited ED without complications.

## 2023-09-10 NOTE — ED Triage Notes (Signed)
 Pt c/o "allergy issues", yellow mucus with sore throat x 5 days, RT side CP radiating to back and RT arm that started yesterday.

## 2023-09-30 ENCOUNTER — Encounter: Payer: Self-pay | Admitting: Orthopedic Surgery

## 2023-09-30 ENCOUNTER — Ambulatory Visit: Admitting: Orthopedic Surgery

## 2023-09-30 DIAGNOSIS — M7662 Achilles tendinitis, left leg: Secondary | ICD-10-CM | POA: Diagnosis not present

## 2023-09-30 DIAGNOSIS — M7672 Peroneal tendinitis, left leg: Secondary | ICD-10-CM

## 2023-09-30 DIAGNOSIS — S93492D Sprain of other ligament of left ankle, subsequent encounter: Secondary | ICD-10-CM

## 2023-09-30 MED ORDER — PREDNISONE 10 MG PO TABS: 10.0000 mg | ORAL_TABLET | Freq: Three times a day (TID) | ORAL | 0 refills | Status: DC

## 2023-09-30 NOTE — Progress Notes (Signed)
   LMP 08/26/2023   There is no height or weight on file to calculate BMI.  Chief Complaint  Patient presents with   Ankle Pain    Left / still has pain good days and bad days    No diagnosis found.  DOI/DOS/ 08/07/22  Unchanged/ still has pain that comes and goes

## 2023-09-30 NOTE — Progress Notes (Signed)
 Patient ID: Yvette Conley, female   DOB: 1985/09/27, 38 y.o.   MRN: [id number]  Chief Complaint  Patient presents with   Ankle Pain    Left / still has pain good days and bad days   38 year old female presents with continued left ankle pain after ankle injury back in February 2024.  She was treated with standard of care for ankle sprain with PR ICE and ankle bracing home exercises.  She says it never really got all the way better and then in the last 6 months has worsened to include pain in the Achilles pain between the Achilles and the malleolus and then pain from the posterior aspect of the malleolus down the lateral border of the foot and then some pain in the sinus Tarsi  Physical Exam Vitals and nursing note reviewed.  Constitutional:      Appearance: Normal appearance.  HENT:     Head: Normocephalic and atraumatic.  Eyes:     General: No scleral icterus.       Right eye: No discharge.        Left eye: No discharge.     Extraocular Movements: Extraocular movements intact.     Conjunctiva/sclera: Conjunctivae normal.     Pupils: Pupils are equal, round, and reactive to light.  Cardiovascular:     Rate and Rhythm: Normal rate.     Pulses: Normal pulses.  Musculoskeletal:     Comments: Trace positive anterior drawer with firm endpoint tenderness in the sinus Tarsi  Tenderness over the peroneal tendons posterior to the lateral malleolus  Mild tenderness over the Achilles without plantarflexion contracture  Neurovascular exam is intact  Skin:    General: Skin is warm and dry.     Capillary Refill: Capillary refill takes less than 2 seconds.  Neurological:     General: No focal deficit present.     Mental Status: She is alert and oriented to person, place, and time.  Psychiatric:        Mood and Affect: Mood normal.        Behavior: Behavior normal.        Thought Content: Thought content normal.        Judgment: Judgment normal.    Initial image studies were negative for  fracture  Differential diagnosis assessment and plan  Differential diagnosis includes peroneal tendinitis, Achilles tendinitis, sinus Tarsi syndrome, meniscoid lesion  The best way to delineate is to perform an MRI of the ankle  The patient is taken meloxicam already and ibuprofen and Tylenol  Meds ordered this encounter  Medications   predniSONE (DELTASONE) 10 MG tablet    Sig: Take 1 tablet (10 mg total) by mouth 3 (three) times daily.    Dispense:  42 tablet    Refill:  0   Turn after MRI to discuss results and further treatment plan

## 2023-09-30 NOTE — Patient Instructions (Addendum)
 Your insurance does not require approval for MRI please go ahead and call to schedule your appointment with [facility] within at least one (1) week.   Central Scheduling [phone]

## 2023-10-07 ENCOUNTER — Ambulatory Visit (HOSPITAL_COMMUNITY)
Admission: RE | Admit: 2023-10-07 | Discharge: 2023-10-07 | Disposition: A | Discharge status: Home / Self Care | Source: Ambulatory Visit | Attending: Orthopedic Surgery | Admitting: Orthopedic Surgery

## 2023-10-07 DIAGNOSIS — S93492D Sprain of other ligament of left ankle, subsequent encounter: Secondary | ICD-10-CM | POA: Insufficient documentation

## 2023-10-25 ENCOUNTER — Ambulatory Visit: Admitting: Orthopedic Surgery

## 2023-10-25 VITALS — BP 114/79 | HR 99

## 2023-10-25 DIAGNOSIS — M899 Disorder of bone, unspecified: Secondary | ICD-10-CM | POA: Diagnosis not present

## 2023-10-25 DIAGNOSIS — M949 Disorder of cartilage, unspecified: Secondary | ICD-10-CM

## 2023-10-25 DIAGNOSIS — G8929 Other chronic pain: Secondary | ICD-10-CM

## 2023-10-25 DIAGNOSIS — M25572 Pain in left ankle and joints of left foot: Secondary | ICD-10-CM

## 2023-10-25 NOTE — Progress Notes (Signed)
   There were no vitals taken for this visit.  There is no height or weight on file to calculate BMI.  Chief Complaint  Patient presents with   Results    Left ankle   MRI RESULTS LEFT ANKLE  No diagnosis found.  DOI/DOS/ Date: 10/25/23  Unchanged

## 2023-10-25 NOTE — Patient Instructions (Addendum)
 We are referring you to [facility] from [facility] address is [address] [place] The phone number is [phone]  The office will call you with an appointment Dr. Julio Ohm is the ankle specialist    Place this in the work note: Needs scooter and walking boot  Subchondral bone edema  Anterior talo fibular ligament edema/inflammation

## 2023-10-25 NOTE — Progress Notes (Signed)
 Chief Complaint  Patient presents with   Results    Left ankle     Yvette Conley is 38 years old she had an ankle injury back in February 2024 she was treated in the usual manner with ASO brace, alphabet exercises, the routine PRICE measures  After 3 weeks she was seen in follow-up she was started on a home exercise program to include strengthening exercises  She was seen again April of this year still complaining of pain and she was sent for MRI  Her MRI shows lesion in the medial talus and inflammation or strain in the anterior talofibular ligament  MRI was reviewed with her my reading is what stated above  She noted that pain increased she went back in the boot used her scooter and after 2 or 3 days she was able to walk again but she still having significant discomfort  Because of the timeframe and nature of the lesion  I wrote commended a consult with Dr. Julio Ohm to review and advise  In the meantime she will be back on the scooter and back in the boot

## 2023-10-28 ENCOUNTER — Encounter: Payer: Self-pay | Admitting: Adult Health

## 2023-10-28 ENCOUNTER — Telehealth: Payer: BC Managed Care – PPO | Admitting: Adult Health

## 2023-10-28 DIAGNOSIS — G43109 Migraine with aura, not intractable, without status migrainosus: Secondary | ICD-10-CM | POA: Diagnosis not present

## 2023-10-28 DIAGNOSIS — G248 Other dystonia: Secondary | ICD-10-CM | POA: Diagnosis not present

## 2023-10-28 MED ORDER — AJOVY 225 MG/1.5ML ~~LOC~~ SOAJ: 225.0000 mg | SUBCUTANEOUS | 11 refills | Status: AC
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
## 2023-10-28 NOTE — Patient Instructions (Addendum)
 Your Plan:  Continue Ajovy  monthly injection for migraine prevention  Use Nurtec as needed for migraine rescue  Please call if your dystonia symptoms do not improve    Follow-up in 1 year via MyChart video visit or call earlier if needed     Thank you for coming to see us  at [facility]. I hope we have been able to provide you high quality care today.  You may receive a patient satisfaction survey over the next few weeks. We would appreciate your feedback and comments so that we may continue to improve ourselves and the health of our patients.

## 2023-11-02 ENCOUNTER — Other Ambulatory Visit: Payer: Self-pay | Admitting: Family Medicine

## 2023-11-04 ENCOUNTER — Ambulatory Visit (HOSPITAL_COMMUNITY)
Admission: RE | Admit: 2023-11-04 | Discharge: 2023-11-04 | Disposition: A | Discharge status: Home / Self Care | Source: Ambulatory Visit | Attending: Family Medicine | Admitting: Family Medicine

## 2023-11-04 ENCOUNTER — Ambulatory Visit: Payer: Self-pay | Admitting: Family Medicine

## 2023-11-04 ENCOUNTER — Ambulatory Visit: Admitting: Orthopedic Surgery

## 2023-11-04 ENCOUNTER — Ambulatory Visit: Payer: 59 | Admitting: Family Medicine

## 2023-11-04 VITALS — BP 110/76 | HR 96 | Resp 16 | Ht 67.0 in | Wt 217.1 lb

## 2023-11-04 DIAGNOSIS — M25572 Pain in left ankle and joints of left foot: Secondary | ICD-10-CM

## 2023-11-04 DIAGNOSIS — M546 Pain in thoracic spine: Secondary | ICD-10-CM | POA: Diagnosis not present

## 2023-11-04 DIAGNOSIS — G43109 Migraine with aura, not intractable, without status migrainosus: Secondary | ICD-10-CM

## 2023-11-04 DIAGNOSIS — G8929 Other chronic pain: Secondary | ICD-10-CM | POA: Diagnosis not present

## 2023-11-04 DIAGNOSIS — S93492D Sprain of other ligament of left ankle, subsequent encounter: Secondary | ICD-10-CM

## 2023-11-04 DIAGNOSIS — M545 Low back pain, unspecified: Secondary | ICD-10-CM | POA: Insufficient documentation

## 2023-11-04 MED ORDER — KETOROLAC TROMETHAMINE 60 MG/2ML IM SOLN
60.0000 mg | Freq: Once | INTRAMUSCULAR | Status: AC
  Administered 2023-11-04: 60 mg via INTRAMUSCULAR

## 2023-11-04 MED ORDER — CYCLOBENZAPRINE HCL 10 MG PO TABS: ORAL_TABLET | ORAL | 0 refills | Status: DC

## 2023-11-04 MED ORDER — METHYLPREDNISOLONE ACETATE 80 MG/ML IJ SUSP
80.0000 mg | Freq: Once | INTRAMUSCULAR | Status: AC
  Administered 2023-11-04: 80 mg via INTRAMUSCULAR

## 2023-11-04 NOTE — Patient Instructions (Addendum)
 Follow-up in 5 weeks reevaluate back pain status post fall, call if you need me sooner.  Toradol  60 mg IM in the office and Depo-Medrol  80 mg IM.  X-ray of the mid and low back today.  Take prednisone  10 mg 1 tablet 3 times daily for the next 1 week, use the medication that you already have at home.  Flexeril  is prescribed 3 times a day for 1 week then twice daily as needed for back spasm.  Thanks for choosing [facility], we consider it a privelige to serve you.

## 2023-11-04 NOTE — Assessment & Plan Note (Addendum)
 S/P FALL, xray of lumbar spine, anti inflammatories and muscle relaxant

## 2023-11-09 ENCOUNTER — Encounter: Payer: Self-pay | Admitting: Orthopedic Surgery
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
## 2023-11-15 ENCOUNTER — Encounter: Payer: Self-pay | Admitting: Family Medicine

## 2023-11-15 NOTE — Progress Notes (Signed)
   Yvette Conley     MRN: [id number]      DOB: 06-20-1985  Chief Complaint  Patient presents with   Follow-up    3 month f/u    Back Pain    Experienced a hard fall on Tuesday morning. States she is now experiencing pain in entire body but most severe in lower back and hamstrings. OTC pain meds not helping     HPI Yvette Conley is here for follow up and re-evaluation of chronic medical conditions, medication management and review of any available recent lab and radiology data.  Preventive health is updated, specifically  Cancer screening and Immunization.   Questions or concerns regarding consultations or procedures which the PT has had in the interim are  addressed. Fell 3 days ago and now experiencing svere generalized pain, esp in mid and low back No new weakness , numbness or incontinence ROS Denies recent fever or chills. Denies sinus pressure, nasal congestion, ear pain or sore throat. Denies chest congestion, productive cough or wheezing. Denies chest pains, palpitations and leg swelling Denies abdominal pain, nausea, vomiting,diarrhea or constipation.   Denies dysuria, frequency, hesitancy or incontinence. . Denies  uncontrolled headaches, seizures, numbness, or tingling. Denies depression, anxiety or insomnia. Denies skin break down or rash.   PE  BP 110/76   Pulse 96   Resp 16   Ht 5\' 7"  (1.702 m)   Wt 217 lb 1 oz (98.5 kg)   SpO2 95%   BMI 34.00 kg/m   Patient alert and oriented and in no cardiopulmonary distress.  HEENT: No facial asymmetry, EOMI,     Neck supple .  Chest: Clear to auscultation bilaterally.  CVS: S1, S2 no murmurs, no S3.Regular rate.  ABD: Soft non tender.   Ext: No edema  MS: Adequate ROM spine, shoulders, hips and knees.  Skin: Intact, no ulcerations or rash noted.  Psych: Good eye contact, normal affect. Memory intact not anxious or depressed appearing.  CNS: CN 2-12 intact, power,  normal throughout.no focal deficits  noted.   Assessment & Plan  Lumbar pain S/P FALL, xray of lumbar spine, anti inflammatories and muscle relaxant  Thoracic back pain Uncontrolled.Toradol  and depo medrol  administered IM in the office , to be followed by a short course of oral prednisone  X ray  Fllexeril every 8 hrs Call if symptoms worsen F/u in 5 weeks.   Morbid obesity (HCC)  Patient re-educated about  the importance of commitment to a  minimum of 150 minutes of exercise per week as able.  The importance of healthy food choices with portion control discussed, as well as eating regularly and within a 12 hour window most days. The need to choose "clean , green" food 50 to 75% of the time is discussed, as well as to make water  the primary drink and set a goal of 64 ounces water  daily.       11/04/2023    4:17 PM 09/10/2023   12:34 PM 07/29/2023    3:51 PM  Weight /BMI  Weight 217 lb 1 oz 214 lb 218 lb 1.9 oz  Height 5\' 7"  (1.702 m)  5\' 7"  (1.702 m)  BMI 34 kg/m2 33.52 kg/m2 34.16 kg/m2    Unchanged , will work on food choice in particular  Migraine with aura and without status migrainosus, not intractable Controlled, no change in medication

## 2023-11-15 NOTE — Assessment & Plan Note (Signed)
 Controlled, no change in medication

## 2023-11-15 NOTE — Assessment & Plan Note (Signed)
  Patient re-educated about  the importance of commitment to a  minimum of 150 minutes of exercise per week as able.  The importance of healthy food choices with portion control discussed, as well as eating regularly and within a 12 hour window most days. The need to choose "clean , green" food 50 to 75% of the time is discussed, as well as to make water  the primary drink and set a goal of 64 ounces water  daily.       11/04/2023    4:17 PM 09/10/2023   12:34 PM 07/29/2023    3:51 PM  Weight /BMI  Weight 217 lb 1 oz 214 lb 218 lb 1.9 oz  Height 5\' 7"  (1.702 m)  5\' 7"  (1.702 m)  BMI 34 kg/m2 33.52 kg/m2 34.16 kg/m2    Unchanged , will work on food choice in particular

## 2023-11-15 NOTE — Assessment & Plan Note (Signed)
 Uncontrolled.Toradol  and depo medrol  administered IM in the office , to be followed by a short course of oral prednisone  X ray  Fllexeril every 8 hrs Call if symptoms worsen F/u in 5 weeks.

## 2023-11-23 ENCOUNTER — Ambulatory Visit (HOSPITAL_COMMUNITY): Attending: Orthopedic Surgery

## 2023-11-23 ENCOUNTER — Encounter (HOSPITAL_COMMUNITY): Payer: Self-pay

## 2023-11-23 ENCOUNTER — Other Ambulatory Visit: Payer: Self-pay

## 2023-11-23 DIAGNOSIS — S93492D Sprain of other ligament of left ankle, subsequent encounter: Secondary | ICD-10-CM | POA: Diagnosis not present

## 2023-11-23 DIAGNOSIS — M25572 Pain in left ankle and joints of left foot: Secondary | ICD-10-CM | POA: Insufficient documentation

## 2023-11-23 DIAGNOSIS — G8929 Other chronic pain: Secondary | ICD-10-CM | POA: Diagnosis not present

## 2023-11-23 DIAGNOSIS — Z7409 Other reduced mobility: Secondary | ICD-10-CM | POA: Diagnosis present
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
## 2023-11-23 NOTE — Addendum Note (Signed)
 Addended by: Winni Ehrhard on: 11/23/2023 05:29 PM   Modules accepted: Orders

## 2023-12-01 ENCOUNTER — Ambulatory Visit (HOSPITAL_COMMUNITY)

## 2023-12-01 ENCOUNTER — Encounter (HOSPITAL_COMMUNITY): Payer: Self-pay

## 2023-12-01 DIAGNOSIS — M25572 Pain in left ankle and joints of left foot: Secondary | ICD-10-CM

## 2023-12-01 DIAGNOSIS — Z7409 Other reduced mobility: Secondary | ICD-10-CM
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
## 2023-12-02 ENCOUNTER — Encounter (HOSPITAL_COMMUNITY)

## 2023-12-08 ENCOUNTER — Ambulatory Visit (HOSPITAL_COMMUNITY): Admitting: Physical Therapy

## 2023-12-08 DIAGNOSIS — M25572 Pain in left ankle and joints of left foot: Secondary | ICD-10-CM | POA: Diagnosis not present

## 2023-12-08 DIAGNOSIS — Z7409 Other reduced mobility: Secondary | ICD-10-CM
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
## 2023-12-09 ENCOUNTER — Encounter (HOSPITAL_COMMUNITY)

## 2023-12-14 ENCOUNTER — Ambulatory Visit: Admitting: Family Medicine

## 2023-12-14 ENCOUNTER — Encounter: Payer: Self-pay | Admitting: Family Medicine

## 2023-12-14 VITALS — BP 101/71 | HR 101 | Resp 16 | Ht 67.0 in | Wt 226.0 lb

## 2023-12-14 DIAGNOSIS — E669 Obesity, unspecified: Secondary | ICD-10-CM | POA: Diagnosis not present

## 2023-12-14 DIAGNOSIS — R1031 Right lower quadrant pain: Secondary | ICD-10-CM

## 2023-12-14 DIAGNOSIS — R233 Spontaneous ecchymoses: Secondary | ICD-10-CM | POA: Diagnosis not present

## 2023-12-14 DIAGNOSIS — M62830 Muscle spasm of back: Secondary | ICD-10-CM

## 2023-12-14 DIAGNOSIS — G43109 Migraine with aura, not intractable, without status migrainosus: Secondary | ICD-10-CM

## 2023-12-14 DIAGNOSIS — T148XXA Other injury of unspecified body region, initial encounter: Secondary | ICD-10-CM

## 2023-12-14 NOTE — Patient Instructions (Addendum)
 F/U in 16 to 18 weeks,  call if you need me sooner  Write specifics down as too changes you will make and weight loss goal in this period and send me a message thios pm preferably  AVOID all FATS/ OILS AS YOU ARE BEST ABLE IF STOACH PAIN DOES NOT IMPROVE, YOU NEED TO SEE gi dOC  I WILL REFER YOU TO hEMATOLOGY RE BERUISING  Thanks for choosing [facility], we consider it a privelige to serve you. Need hpv AND hEP b VACCINES  PAP IS PAST DUE YOU NEED TO GET THAT  Thanks for choosing [facility], we consider it a privelige to serve you.

## 2023-12-15 ENCOUNTER — Encounter (HOSPITAL_COMMUNITY): Payer: Self-pay

## 2023-12-15 ENCOUNTER — Ambulatory Visit (HOSPITAL_COMMUNITY): Attending: Orthopedic Surgery

## 2023-12-15 DIAGNOSIS — M25572 Pain in left ankle and joints of left foot: Secondary | ICD-10-CM | POA: Insufficient documentation

## 2023-12-15 DIAGNOSIS — Z7409 Other reduced mobility: Secondary | ICD-10-CM | POA: Insufficient documentation
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
## 2023-12-16 ENCOUNTER — Ambulatory Visit (HOSPITAL_COMMUNITY)

## 2023-12-16 DIAGNOSIS — Z7409 Other reduced mobility: Secondary | ICD-10-CM

## 2023-12-16 DIAGNOSIS — M25572 Pain in left ankle and joints of left foot: Secondary | ICD-10-CM
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
## 2023-12-20 ENCOUNTER — Encounter (HOSPITAL_COMMUNITY): Admitting: Physical Therapy

## 2023-12-22 ENCOUNTER — Encounter (HOSPITAL_COMMUNITY)

## 2023-12-26 ENCOUNTER — Encounter: Payer: Self-pay | Admitting: Family Medicine

## 2023-12-26 DIAGNOSIS — T148XXA Other injury of unspecified body region, initial encounter: Secondary | ICD-10-CM | POA: Insufficient documentation

## 2023-12-26 NOTE — Assessment & Plan Note (Signed)
 New onset in past 4 to 8 weeks of bruising in different areas, refer to hematology for further assessment

## 2023-12-26 NOTE — Assessment & Plan Note (Signed)
  Patient re-educated about  the importance of commitment to a  minimum of 150 minutes of exercise per week as able.  The importance of healthy food choices with portion control discussed, as well as eating regularly and within a 12 hour window most days. The need to choose clean , green food 50 to 75% of the time is discussed, as well as to make water  the primary drink and set a goal of 64 ounces water  daily.       12/14/2023    4:14 PM 11/04/2023    4:17 PM 09/10/2023   12:34 PM  Weight /BMI  Weight 226 lb 0.6 oz 217 lb 1 oz 214 lb  Height 5' 7 (1.702 m) 5' 7 (1.702 m)   BMI 35.4 kg/m2 34 kg/m2 33.52 kg/m2    Needs to work on lifestyle change

## 2023-12-26 NOTE — Progress Notes (Signed)
 Yvette Conley     MRN: [id number]      DOB: February 10, 1986  Chief Complaint  Patient presents with   Bleeding/Bruising    Pt complains of random bruising on arms and legs with unknown injuries.    Abdominal Pain    Pt complains of intermittent stomach pain for last 2 months but has gotten worse over the last 2 weeks. States it is the same pain she felt before she had gallbladder removal     HPI Yvette Conley is here for follow up and re-evaluation of chronic medical conditions, medication management and review of any available recent lab and radiology data.  Preventive health is updated, specifically  Cancer screening and Immunization.   Questions or concerns regarding consultations or procedures which the PT has had in the interim are  addressed. The PT denies any adverse reactions to current medications since the last visit.  Concerns as above   ROS Denies recent fever or chills. Denies sinus pressure, nasal congestion, ear pain or sore throat. Denies chest congestion, productive cough or wheezing. Denies chest pains, palpitations and leg swelling .   Denies dysuria, frequency, hesitancy or incontinence. Denies joint pain, swelling and limitation in mobility. Denies headaches, seizures, numbness, or tingling. Denies depression, anxiety or insomnia. Denies skin break down or rash.   PE  BP 101/71   Pulse (!) 101   Resp 16   Ht 5' 7 (1.702 m)   Wt 226 lb 0.6 oz (102.5 kg)   SpO2 96%   BMI 35.40 kg/m   Patient alert and oriented and in no cardiopulmonary distress.  HEENT: No facial asymmetry, EOMI,     Neck supple .  Chest: Clear to auscultation bilaterally.  CVS: S1, S2 no murmurs, no S3.Regular rate.  ABD: Soft non tender.   Ext: No edema  MS: Adequate ROM spine, shoulders, hips and knees.  Skin: Intact, no ulcerations or rash noted.  Psych: Good eye contact, normal affect. Memory intact not anxious or depressed appearing.  CNS: CN 2-12 intact, power,  normal  throughout.no focal deficits noted.   Assessment & Plan  Migraine with aura and without status migrainosus, not intractable Controlled, no change in medication Managed by Neurology  Morbid obesity [facility])  Patient re-educated about  the importance of commitment to a  minimum of 150 minutes of exercise per week as able.  The importance of healthy food choices with portion control discussed, as well as eating regularly and within a 12 hour window most days. The need to choose clean , green food 50 to 75% of the time is discussed, as well as to make water  the primary drink and set a goal of 64 ounces water  daily.       12/14/2023    4:14 PM 11/04/2023    4:17 PM 09/10/2023   12:34 PM  Weight /BMI  Weight 226 lb 0.6 oz 217 lb 1 oz 214 lb  Height 5' 7 (1.702 m) 5' 7 (1.702 m)   BMI 35.4 kg/m2 34 kg/m2 33.52 kg/m2    Needs to work on lifestyle change   Back spasm Uses flexeril  , as needed  RLQ abdominal pain Reports recurrent pain and spasm , howveer has not been intentionally avoiding fried and fatty foods, I explained the need to do this to reduce symptoms of pain and spasm, due to cholecystectomy If persists or worsens then GI eval  Bruising New onset in past 4 to 8 weeks of bruising in different areas,  refer to hematology for further assessment

## 2023-12-26 NOTE — Assessment & Plan Note (Signed)
 Reports recurrent pain and spasm , howveer has not been intentionally avoiding fried and fatty foods, I explained the need to do this to reduce symptoms of pain and spasm, due to cholecystectomy If persists or worsens then GI eval

## 2023-12-26 NOTE — Assessment & Plan Note (Signed)
Controlled, no change in medication Managed by Neurology 

## 2023-12-26 NOTE — Assessment & Plan Note (Signed)
 Uses flexeril  , as needed

## 2023-12-27 ENCOUNTER — Ambulatory Visit (HOSPITAL_COMMUNITY)

## 2023-12-27 DIAGNOSIS — Z7409 Other reduced mobility: Secondary | ICD-10-CM | POA: Diagnosis not present

## 2023-12-27 DIAGNOSIS — M25572 Pain in left ankle and joints of left foot: Secondary | ICD-10-CM
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
## 2023-12-29 ENCOUNTER — Encounter (HOSPITAL_COMMUNITY): Admitting: Physical Therapy

## 2023-12-30 ENCOUNTER — Encounter: Payer: Self-pay | Admitting: Family Medicine

## 2024-01-04 ENCOUNTER — Encounter (HOSPITAL_COMMUNITY)

## 2024-01-04 ENCOUNTER — Ambulatory Visit: Admitting: Orthopedic Surgery

## 2024-01-12 ENCOUNTER — Telehealth: Payer: Self-pay | Admitting: Pharmacy Technician

## 2024-01-12 ENCOUNTER — Encounter (HOSPITAL_COMMUNITY): Admitting: Physical Therapy

## 2024-01-12 ENCOUNTER — Other Ambulatory Visit (HOSPITAL_COMMUNITY): Payer: Self-pay

## 2024-01-12 NOTE — Telephone Encounter (Signed)
 Pharmacy Patient Advocate Encounter   Received notification from Fax that prior authorization for AJOVY  (fremanezumab -vfrm) injection 225MG /1.5ML auto-injectors is required/requested.   Insurance verification completed.   The patient is insured through [facility] .   Per test claim: PA required; PA started via CoverMyMeds. KEY [id number] . Waiting for clinical questions to populate.

## 2024-01-12 NOTE — Telephone Encounter (Signed)
 Clinical Questions have been submitted

## 2024-01-13 ENCOUNTER — Ambulatory Visit (HOSPITAL_COMMUNITY)

## 2024-01-13 ENCOUNTER — Encounter (HOSPITAL_COMMUNITY): Payer: Self-pay

## 2024-01-13 DIAGNOSIS — M25572 Pain in left ankle and joints of left foot: Secondary | ICD-10-CM

## 2024-01-13 DIAGNOSIS — Z7409 Other reduced mobility: Secondary | ICD-10-CM | POA: Diagnosis not present

## 2024-01-13 NOTE — Telephone Encounter (Signed)
 Pharmacy Patient Advocate Encounter  Received notification from [facility] that Prior Authorization for AJOVY  (fremanezumab -vfrm) injection 225MG /1.5ML auto-injectors  has been APPROVED from 01/12/2024 to 01/11/2025   PA #/Case ID/Reference #: [id number]
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
## 2024-02-23 ENCOUNTER — Encounter: Payer: Self-pay | Admitting: Family Medicine

## 2024-02-23 ENCOUNTER — Ambulatory Visit: Admitting: Family Medicine

## 2024-02-23 VITALS — BP 118/82 | HR 113 | Temp 98.9°F | Ht 67.0 in | Wt 221.1 lb

## 2024-02-23 DIAGNOSIS — B9689 Other specified bacterial agents as the cause of diseases classified elsewhere: Secondary | ICD-10-CM

## 2024-02-23 DIAGNOSIS — G43109 Migraine with aura, not intractable, without status migrainosus: Secondary | ICD-10-CM

## 2024-02-23 DIAGNOSIS — J019 Acute sinusitis, unspecified: Secondary | ICD-10-CM | POA: Diagnosis not present

## 2024-02-23 MED ORDER — DOXYCYCLINE HYCLATE 100 MG PO TABS: 100.0000 mg | ORAL_TABLET | Freq: Two times a day (BID) | ORAL | 0 refills | Status: AC

## 2024-02-23 MED ORDER — MOMETASONE FUROATE 50 MCG/ACT NA SUSP
2.0000 | Freq: Every day | NASAL | 12 refills | Status: AC
Start: 2024-02-23 — End: ?
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
## 2024-03-23 ENCOUNTER — Encounter: Payer: Self-pay | Admitting: Family Medicine

## 2024-04-17 ENCOUNTER — Encounter: Payer: Self-pay | Admitting: Radiology

## 2024-04-21 ENCOUNTER — Ambulatory Visit: Admitting: Family Medicine

## 2024-04-21 ENCOUNTER — Encounter: Payer: Self-pay | Admitting: Family Medicine

## 2024-04-21 VITALS — BP 109/74 | HR 90 | Ht 67.0 in | Wt 214.0 lb

## 2024-04-21 DIAGNOSIS — G248 Other dystonia: Secondary | ICD-10-CM | POA: Diagnosis not present

## 2024-04-21 DIAGNOSIS — R413 Other amnesia: Secondary | ICD-10-CM | POA: Diagnosis not present

## 2024-04-21 DIAGNOSIS — Z01419 Encounter for gynecological examination (general) (routine) without abnormal findings: Secondary | ICD-10-CM

## 2024-04-21 DIAGNOSIS — R471 Dysarthria and anarthria: Secondary | ICD-10-CM | POA: Diagnosis not present

## 2024-04-21 DIAGNOSIS — H538 Other visual disturbances: Secondary | ICD-10-CM

## 2024-04-21 DIAGNOSIS — E669 Obesity, unspecified: Secondary | ICD-10-CM

## 2024-04-21 DIAGNOSIS — Z13220 Encounter for screening for lipoid disorders: Secondary | ICD-10-CM

## 2024-04-21 DIAGNOSIS — E559 Vitamin D deficiency, unspecified: Secondary | ICD-10-CM

## 2024-04-21 DIAGNOSIS — R7303 Prediabetes: Secondary | ICD-10-CM

## 2024-04-21 DIAGNOSIS — G43109 Migraine with aura, not intractable, without status migrainosus: Secondary | ICD-10-CM

## 2024-04-21 DIAGNOSIS — R410 Disorientation, unspecified: Secondary | ICD-10-CM

## 2024-04-21 DIAGNOSIS — D509 Iron deficiency anemia, unspecified: Secondary | ICD-10-CM

## 2024-04-21 MED ORDER — NORETHINDRONE 0.35 MG PO TABS: 1.0000 | ORAL_TABLET | Freq: Every day | ORAL | 11 refills | Status: AC

## 2024-04-21 NOTE — Assessment & Plan Note (Addendum)
 1 year history reports worsening refer for eye exam, not associated with migraines

## 2024-04-21 NOTE — Assessment & Plan Note (Signed)
 4 month h/o increased muscle spasms, spouse has to release fingers, bilateral hip and thigh pain to bone

## 2024-04-21 NOTE — Patient Instructions (Addendum)
 F/U in 4 months  Lab order for fasting labs will be at front desk next Monday, pls get next week  You are referred  for eye exam  I will refer you to [facility] for pap  You are referred to Neurology  Pls reconsider vaccines  It is important that you exercise regularly at least 30 minutes 5 times a week. If you develop chest pain, have severe difficulty breathing, or feel very tired, stop exercising immediately and seek medical attention   Thanks for choosing [facility], we consider it a privelige to serve you.

## 2024-04-24 ENCOUNTER — Encounter: Payer: Self-pay | Admitting: Family Medicine

## 2024-04-24 DIAGNOSIS — Z01419 Encounter for gynecological examination (general) (routine) without abnormal findings: Secondary | ICD-10-CM | POA: Insufficient documentation

## 2024-04-24 DIAGNOSIS — R471 Dysarthria and anarthria: Secondary | ICD-10-CM | POA: Insufficient documentation

## 2024-04-24 DIAGNOSIS — R410 Disorientation, unspecified: Secondary | ICD-10-CM | POA: Insufficient documentation

## 2024-04-24 DIAGNOSIS — R413 Other amnesia: Secondary | ICD-10-CM | POA: Insufficient documentation

## 2024-04-24 NOTE — Assessment & Plan Note (Signed)
 Updated lab needed at/ before next visit.

## 2024-04-24 NOTE — Assessment & Plan Note (Signed)
 Patient educated about the importance of limiting  Carbohydrate intake , the need to commit to daily physical activity for a minimum of 30 minutes , and to commit weight loss. The fact that changes in all these areas will reduce or eliminate all together the development of diabetes is stressed.      Latest Ref Rng & Units 09/10/2023   12:41 PM 04/05/2023   12:28 PM 08/11/2022    2:35 PM 07/09/2022    3:12 PM 04/29/2022    1:51 PM  Diabetic Labs  HbA1c 4.8 - 5.6 %  5.4  5.8     Chol 100 - 199 mg/dL  883      HDL >60 mg/dL  53      Calc LDL 0 - 99 mg/dL  51      Triglycerides 0 - 149 mg/dL  52      Creatinine 9.55 - 1.00 mg/dL 9.37  9.38   9.33  9.35       04/21/2024    4:00 PM 02/23/2024    3:18 PM 12/14/2023    4:14 PM 11/04/2023    4:17 PM 10/25/2023    4:22 PM 09/10/2023    5:00 PM 09/10/2023    2:15 PM  BP/Weight  Systolic BP 109 118 101 110 114 110 111  Diastolic BP 74 82 71 76 79 60 75  Wt. (Lbs) 214 221.13 226.04 217.06     BMI 33.52 kg/m2 34.63 kg/m2 35.4 kg/m2 34 kg/m2          No data to display          Ul

## 2024-04-24 NOTE — Assessment & Plan Note (Signed)
 Referred to her Gyne as preferred

## 2024-04-24 NOTE — Progress Notes (Signed)
 Yvette Conley     MRN: [id number]      DOB: 10/25/1985  Chief Complaint  Patient presents with   Medical Management of Chronic Issues    Follow up   fiber myalgia    Increased flare up    HPI Yvette Conley is here for follow up and re-evaluation of chronic medical conditions,  4 month h/o increased muscle spasm and pain down to the bones Also reports episodic confusion , memory loss , dysarthria over past 4 months, has a 1 year h/o progressive blurring of her vision  Denies  uncontrolled depression or anxiety No regular exercise, taking GLP1 for weight loss with success ROS Denies recent fever or chills. Denies sinus pressure, nasal congestion, ear pain or sore throat. Denies chest congestion, productive cough or wheezing. Denies chest pains, palpitations and leg swelling Denies abdominal pain, nausea, vomiting,diarrhea or constipation.   Denies dysuria, frequency, hesitancy or incontinence. . Denies skin break down or rash.c/o discoloration/ thickening of fingernail beds, has used gel nail polish up to 10 months ago   PE  BP 109/74   Pulse 90   Ht 5' 7 (1.702 m)   Wt 214 lb (97.1 kg)   SpO2 99%   BMI 33.52 kg/m   Patient alert and oriented and in no cardiopulmonary distress.  HEENT: No facial asymmetry, EOMI,     Neck supple .  Chest: Clear to auscultation bilaterally.  CVS: S1, S2 no murmurs, no S3.Regular rate.  ABD: Soft non tender.   Ext: No edema  MS: Adequate ROM spine, shoulders, hips and knees.  Skin: Intact, no ulcerations or rash noted.Mild hyperpigmentation of nail beds, no ridging palpable no crumbling of nails visible  Psych: Good eye contact, normal affect. Memory intact mildly anxious not  depressed appearing.  CNS: CN 2-12 intact, no focal deficits noted.   Assessment & Plan  Paroxysmal dystonia 4 month h/o increased muscle spasms, spouse has to release fingers, bilateral hip and thigh pain to bone  Blurry vision, bilateral 1 year  history reports worsening refer for eye exam, not associated with migraines  Migraine with aura and without status migrainosus, not intractable States headaches are controlled though challenged with medication cost , denies having an of her symptoms of confusion and blurred vision associated with headaches  Prediabetes Patient educated about the importance of limiting  Carbohydrate intake , the need to commit to daily physical activity for a minimum of 30 minutes , and to commit weight loss. The fact that changes in all these areas will reduce or eliminate all together the development of diabetes is stressed.      Latest Ref Rng & Units 09/10/2023   12:41 PM 04/05/2023   12:28 PM 08/11/2022    2:35 PM 07/09/2022    3:12 PM 04/29/2022    1:51 PM  Diabetic Labs  HbA1c 4.8 - 5.6 %  5.4  5.8     Chol 100 - 199 mg/dL  883      HDL >60 mg/dL  53      Calc LDL 0 - 99 mg/dL  51      Triglycerides 0 - 149 mg/dL  52      Creatinine 9.55 - 1.00 mg/dL 9.37  9.38   9.33  9.35       04/21/2024    4:00 PM 02/23/2024    3:18 PM 12/14/2023    4:14 PM 11/04/2023    4:17 PM 10/25/2023    4:22 PM  09/10/2023    5:00 PM 09/10/2023    2:15 PM  BP/Weight  Systolic BP 109 118 101 110 114 110 111  Diastolic BP 74 82 71 76 79 60 75  Wt. (Lbs) 214 221.13 226.04 217.06     BMI 33.52 kg/m2 34.63 kg/m2 35.4 kg/m2 34 kg/m2          No data to display          Ul   Obesity (BMI 35.0-39.9 without comorbidity)  Patient re-educated about  the importance of commitment to a  minimum of 150 minutes of exercise per week as able.  The importance of healthy food choices with portion control discussed, as well as eating regularly and within a 12 hour window most days. The need to choose clean , green food 50 to 75% of the time is discussed, as well as to make water  the primary drink and set a goal of 64 ounces water  daily.       04/21/2024    4:00 PM 02/23/2024    3:18 PM 12/14/2023    4:14 PM  Weight /BMI   Weight 214 lb 221 lb 2 oz 226 lb 0.6 oz  Height 5' 7 (1.702 m) 5' 7 (1.702 m) 5' 7 (1.702 m)  BMI 33.52 kg/m2 34.63 kg/m2 35.4 kg/m2      Dysarthria 4 month h/o intermittent episodes,denies association with headaches, no history of near syncope or seizure like activity refer Neuro  Episodic confusion 4 month history intermittent , needs imaging and appt with Neurology asap  Episodic memory loss obtain brain scan and refer Neurology, no focal defoicits noted at visit  Iron deficiency anemia Updated lab needed at/ before next visit.   Screening for lipid disorders Hyperlipidemia:Low fat diet discussed and encouraged.   Lipid Panel  Lab Results  Component Value Date   CHOL 116 04/05/2023   HDL 53 04/05/2023   LDLCALC 51 04/05/2023   TRIG 52 04/05/2023   CHOLHDL 2.2 04/05/2023     Updated lab needed at/ before next visit.   Vitamin D  deficiency Updated lab needed at/ before next visit.

## 2024-04-24 NOTE — Assessment & Plan Note (Signed)
 States headaches are controlled though challenged with medication cost , denies having an of her symptoms of confusion and blurred vision associated with headaches

## 2024-04-24 NOTE — Assessment & Plan Note (Signed)
 Hyperlipidemia:Low fat diet discussed and encouraged.   Lipid Panel  Lab Results  Component Value Date   CHOL 116 04/05/2023   HDL 53 04/05/2023   LDLCALC 51 04/05/2023   TRIG 52 04/05/2023   CHOLHDL 2.2 04/05/2023     Updated lab needed at/ before next visit.

## 2024-04-24 NOTE — Assessment & Plan Note (Signed)
 4 month h/o intermittent episodes,denies association with headaches, no history of near syncope or seizure like activity refer Neuro

## 2024-04-24 NOTE — Assessment & Plan Note (Signed)
 obtain brain scan and refer Neurology, no focal defoicits noted at visit

## 2024-04-24 NOTE — Assessment & Plan Note (Signed)
 4 month history intermittent , needs imaging and appt with Neurology asap

## 2024-04-24 NOTE — Assessment & Plan Note (Signed)
  Patient re-educated about  the importance of commitment to a  minimum of 150 minutes of exercise per week as able.  The importance of healthy food choices with portion control discussed, as well as eating regularly and within a 12 hour window most days. The need to choose clean , green food 50 to 75% of the time is discussed, as well as to make water  the primary drink and set a goal of 64 ounces water  daily.       04/21/2024    4:00 PM 02/23/2024    3:18 PM 12/14/2023    4:14 PM  Weight /BMI  Weight 214 lb 221 lb 2 oz 226 lb 0.6 oz  Height 5' 7 (1.702 m) 5' 7 (1.702 m) 5' 7 (1.702 m)  BMI 33.52 kg/m2 34.63 kg/m2 35.4 kg/m2

## 2024-04-26 ENCOUNTER — Ambulatory Visit: Payer: Self-pay | Admitting: Family Medicine

## 2024-04-26 LAB — VITAMIN D 25 HYDROXY (VIT D DEFICIENCY, FRACTURES): Vit D, 25-Hydroxy: 21.8 ng/mL — ABNORMAL LOW (ref 30.0–100.0)

## 2024-04-26 LAB — HEMOGLOBIN A1C
Est. average glucose Bld gHb Est-mCnc: 105 mg/dL
Hgb A1c MFr Bld: 5.3 % (ref 4.8–5.6)

## 2024-04-26 LAB — TSH+FREE T4
Free T4: 1.18 ng/dL (ref 0.82–1.77)
TSH: 1.05 u[IU]/mL (ref 0.450–4.500)

## 2024-04-26 LAB — LIPID PANEL W/O CHOL/HDL RATIO
Cholesterol, Total: 125 mg/dL (ref 100–199)
HDL: 59 mg/dL (ref >39)
LDL Chol Calc (NIH): 53 mg/dL (ref 0–99)
Triglycerides: 57 mg/dL (ref 0–149)
VLDL Cholesterol Cal: 13 mg/dL (ref 5–40)

## 2024-04-26 LAB — IRON,TIBC AND FERRITIN PANEL
Ferritin: 92 ng/mL (ref 15–150)
Iron Saturation: 28 % (ref 15–55)
Iron: 73 ug/dL (ref 27–159)
Total Iron Binding Capacity: 263 ug/dL (ref 250–450)
UIBC: 190 ug/dL (ref 131–425)

## 2024-04-26 LAB — CBC
Hematocrit: 33.2 % — ABNORMAL LOW (ref 34.0–46.6)
Hemoglobin: 11 g/dL — ABNORMAL LOW (ref 11.1–15.9)
MCH: 30.9 pg (ref 26.6–33.0)
MCHC: 33.1 g/dL (ref 31.5–35.7)
MCV: 93 fL (ref 79–97)
Platelets: 318 x10E3/uL (ref 150–450)
RBC: 3.56 x10E6/uL — ABNORMAL LOW (ref 3.77–5.28)
RDW: 12.1 % (ref 11.7–15.4)
WBC: 6 x10E3/uL (ref 3.4–10.8)

## 2024-04-26 LAB — VITAMIN B12: Vitamin B-12: 704 pg/mL (ref 232–1245)

## 2024-04-26 LAB — MAGNESIUM: Magnesium: 1.9 mg/dL (ref 1.6–2.3)

## 2024-04-26 MED ORDER — VITAMIN D (ERGOCALCIFEROL) 1.25 MG (50000 UNIT) PO CAPS: 50000.0000 [IU] | ORAL_CAPSULE | ORAL | 6 refills | Status: DC

## 2024-05-03 ENCOUNTER — Ambulatory Visit (HOSPITAL_COMMUNITY)
Admission: RE | Admit: 2024-05-03 | Discharge: 2024-05-03 | Disposition: A | Discharge status: Home / Self Care | Source: Ambulatory Visit | Attending: Family Medicine | Admitting: Family Medicine

## 2024-05-03 DIAGNOSIS — G248 Other dystonia: Secondary | ICD-10-CM | POA: Diagnosis present

## 2024-05-03 DIAGNOSIS — R410 Disorientation, unspecified: Secondary | ICD-10-CM | POA: Diagnosis present

## 2024-05-03 DIAGNOSIS — G43109 Migraine with aura, not intractable, without status migrainosus: Secondary | ICD-10-CM | POA: Diagnosis present

## 2024-05-03 DIAGNOSIS — R471 Dysarthria and anarthria: Secondary | ICD-10-CM | POA: Diagnosis present

## 2024-05-03 DIAGNOSIS — R413 Other amnesia: Secondary | ICD-10-CM | POA: Diagnosis present

## 2024-05-03 DIAGNOSIS — H538 Other visual disturbances: Secondary | ICD-10-CM | POA: Insufficient documentation

## 2024-05-05 ENCOUNTER — Encounter: Payer: Self-pay | Admitting: Family Medicine

## 2024-05-08 NOTE — Telephone Encounter (Signed)
 Please advise

## 2024-05-25 ENCOUNTER — Telehealth: Admitting: Family Medicine

## 2024-05-25 ENCOUNTER — Telehealth: Payer: Self-pay

## 2024-05-25 ENCOUNTER — Ambulatory Visit: Payer: Self-pay

## 2024-05-25 ENCOUNTER — Encounter: Payer: Self-pay | Admitting: Family Medicine

## 2024-05-25 DIAGNOSIS — Z20828 Contact with and (suspected) exposure to other viral communicable diseases: Secondary | ICD-10-CM | POA: Diagnosis not present

## 2024-05-25 DIAGNOSIS — R509 Fever, unspecified: Secondary | ICD-10-CM

## 2024-05-25 DIAGNOSIS — R52 Pain, unspecified: Secondary | ICD-10-CM | POA: Diagnosis not present

## 2024-05-25 DIAGNOSIS — J029 Acute pharyngitis, unspecified: Secondary | ICD-10-CM

## 2024-05-25 DIAGNOSIS — R6889 Other general symptoms and signs: Secondary | ICD-10-CM

## 2024-05-25 MED ORDER — OSELTAMIVIR PHOSPHATE 75 MG PO CAPS: 75.0000 mg | ORAL_CAPSULE | Freq: Two times a day (BID) | ORAL | 0 refills | Status: AC

## 2024-05-25 MED ORDER — CYCLOBENZAPRINE HCL 10 MG PO TABS: 10.0000 mg | ORAL_TABLET | Freq: Three times a day (TID) | ORAL | 0 refills | Status: AC | PRN

## 2024-05-25 NOTE — Progress Notes (Signed)
 Virtual Visit via Video   I connected with patient on 05/25/2024 at 1450 by a video enabled telemedicine application and verified that I am speaking with the correct person using two identifiers.  Location patient: Home Location provider: [facility] Persons participating in the virtual visit: Patient and Provider  I discussed the limitations of evaluation and management by telemedicine and the availability of in person appointments. The patient expressed understanding and agreed to proceed.  Subjective:   HPI:  Pt presents today for  Chief Complaint  Patient presents with   Influenza    Yvette Conley Yvette Conley is a 38 year old female who presents with sore throat, body aches, and fever.  This morning, she began experiencing body aches, which have been occurring recently. She soon developed a severe sore throat that feels swollen and enlarged. Throughout the day, she has been spitting up dark yellow, thick mucus.  She reports earaches, a headache, and a fever of 100.39F while at work, which she has not checked in the past two hours. 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  She has previously taken Tamiflu  a couple of years ago and tolerated it well.       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   Depigmentation of skin 05/03/2023   Prediabetes 01/14/2023   Screening for lipid disorders 01/14/2023    Rectal bleeding 07/13/2022   Pain of right heel 06/20/2022   Allergic sinusitis 03/25/2022   Plantar fasciitis, bilateral 03/25/2022   Obesity (BMI 35.0-39.9 without comorbidity) 10/20/2021   Migraine with aura and without status migrainosus, not intractable 08/26/2021   Fibromyalgia 05/15/2021   Fatigue 05/15/2021   Iron deficiency anemia 05/15/2021   Low libido 05/15/2021   Vitamin D  deficiency 05/15/2021   IBS (irritable bowel syndrome) 02/23/2017   Back spasm 04/09/2014   Irregular menses 06/25/2011   Paroxysmal dystonia 02/08/2011   Internal hemorrhoids 08/15/2010    Social History   Tobacco Use   Smoking status: Never   Smokeless tobacco: Never  Substance Use Topics   Alcohol use: No   Current Medications[1]  Allergies[2]  Objective:   There were no vitals taken for this visit.  Patient is well-developed, well-nourished in no acute distress.  Resting comfortably at home.  Head is normocephalic, atraumatic.  No labored breathing.  Speech is clear and coherent with logical content.  Patient is alert and oriented at baseline.  Raspy voice  Assessment and Plan:   Yvette Conley was seen today for influenza.  Diagnoses and all orders for this visit:  Flu-like symptoms -     oseltamivir  (TAMIFLU ) 75 MG capsule; Take 1 capsule (75 mg total) by mouth 2 (two) times daily for 5 days.  Exposure to influenza -     oseltamivir  (TAMIFLU ) 75 MG capsule; Take 1 capsule (75 mg total) by mouth 2 (two) times daily for 5 days.  Fever and  chills -     oseltamivir  (TAMIFLU ) 75 MG capsule; Take 1 capsule (75 mg total) by mouth 2 (two) times daily for 5 days.  Generalized body aches -     oseltamivir  (TAMIFLU ) 75 MG capsule; Take 1 capsule (75 mg total) by mouth 2 (two) times daily for 5 days.  Sore throat -     oseltamivir  (TAMIFLU ) 75 MG capsule; Take 1 capsule (75 mg total) by mouth 2 (two) times daily for 5 days.       Acute influenza infection Acute onset  of influenza symptoms including sore throat, body aches, headache, and fever of 100.62F. Exposure to confirmed type A flu cases at school. Symptoms consistent with influenza type A. Early initiation of antiviral treatment is crucial for efficacy. - Prescribed Tamiflu  (oseltamivir ) to be started as soon as possible. - Advised use of acetaminophen  or ibuprofen  as needed for fever and body aches. - Encouraged hydration and rest. - Provided work note for absence due to illness.         Return if symptoms worsen or fail to improve.  Rosaline Bruns, FNP-C [facility] [address] [place], [place] [address] [phone]  05/25/2024  Time spent with the patient: 12 minutes, of which >50% was spent in obtaining information about symptoms, reviewing previous labs, evaluations, and treatments, counseling about condition (please see the discussed topics above), and developing a plan to further investigate it; had a number of questions which I addressed.      [1]  Current Outpatient Medications:    oseltamivir  (TAMIFLU ) 75 MG capsule, Take 1 capsule (75 mg total) by mouth 2 (two) times daily for 5 days., Disp: 10 capsule, Rfl: 0   albuterol  (VENTOLIN  HFA) 108 (90 Base) MCG/ACT inhaler, Inhale 1-2 puffs into the lungs every 6 (six) hours as needed for wheezing or shortness of breath., Disp: 1 each, Rfl: 0   cyclobenzaprine  (FLEXERIL ) 10 MG tablet, Take 1 tablet (10 mg total) by mouth 3 (three) times daily as needed for muscle spasms., Disp: 30 tablet, Rfl: 0   Fremanezumab -vfrm (AJOVY ) 225 MG/1.5ML SOAJ, Inject 225 mg into the skin every 30 (thirty) days., Disp: 1.5 mL, Rfl: 11   meclizine  (ANTIVERT ) 12.5 MG tablet, Take 1 tablet (12.5 mg total) by mouth 3 (three) times daily as needed for dizziness., Disp: 30 tablet, Rfl: 0   mometasone  (NASONEX ) 50 MCG/ACT nasal spray, Place 2 sprays into the nose daily., Disp: 1 each, Rfl: 12   norethindrone  (ORTHO MICRONOR ) 0.35 MG  tablet, Take 1 tablet (0.35 mg total) by mouth daily., Disp: 28 tablet, Rfl: 11   Rimegepant Sulfate (NURTEC) 75 MG TBDP, Take 75 mg by mouth as needed (migraine). (Patient not taking: Reported on 02/23/2024), Disp: , Rfl:    tirzepatide  (MOUNJARO ) 12.5 MG/0.5ML Pen, Inject 12.5 mg into the skin once a week. Reports tha t has been on this medication since 2024, Disp: , Rfl:    Vitamin D , Ergocalciferol , (DRISDOL ) 1.25 MG (50000 UNIT) CAPS capsule, Take 1 capsule (50,000 Units total) by mouth every 7 (seven) days., Disp: 5 capsule, Rfl: 6 [2]  Allergies Allergen Reactions   Latex Rash and Other (See Comments)    Reaction: burning     Sulfa Antibiotics Hives and Other (See Comments)    Torso, chest areas   Topamax  [Topiramate ] Other (See Comments)    Dystonia/ muscle spasm, 2023   Wound Dressing Adhesive Hives   Augmentin  [Amoxicillin -Pot Clavulanate] Rash

## 2024-05-25 NOTE — Telephone Encounter (Signed)
Noted patient scheduled

## 2024-05-25 NOTE — Telephone Encounter (Signed)
 Rx sent in

## 2024-05-25 NOTE — Telephone Encounter (Unsigned)
 Copied from CRM #[id number]. Topic: Clinical - Medication Question >> May 25, 2024 11:18 AM Berwyn MATSU wrote: Reason for CRM:  Patient called in to ask if MD can increase dosage on cyclobenzaprine  (FLEXERIL ) 10 MG tablet due to not being effective or working anymore.   May you please advise.

## 2024-05-25 NOTE — Telephone Encounter (Signed)
 Attempted to contact patient x 1 to discuss symptoms; LVM to return call, Will attempt to contact patient at a later time to further discuss concerns.        Message from Berwyn MATSU sent at 05/25/2024 11:24 AM EST  Summary: spasms   Reason for Triage: intermittent spasms right now calm but they come and go

## 2024-05-25 NOTE — Telephone Encounter (Signed)
 FYI Only or Action Required?: FYI only for provider: appointment scheduled on 05/25/24. Virtual OV scheduled for suspected Flu/antiviral treatment. Pt also requesting provider increase her Cyclobenzaprine  dosage, see triage note.  Patient was last seen in primary care on 04/21/2024 by Antonetta Rollene BRAVO, MD.  Called Nurse Triage reporting Spasms and Influenza.  Symptoms began Ongoing.  Interventions attempted: Prescription medications: Cyclobenzaprine  for muscle spasms and Rest, hydration, or home remedies.  Symptoms are: gradually worsening.  Triage Disposition: Call PCP Within 24 Hours  Patient/caregiver understands and will follow disposition?: Yes   Reason for Disposition  [1] Caller has NON-URGENT medicine question about med that PCP prescribed AND [2] triager unable to answer question  [1] Patient is NOT HIGH RISK AND [2] strongly requests antiviral medicine AND [3] flu symptoms present < 48 hours  Answer Assessment - Initial Assessment Questions 1. NAME of MEDICINE: What medicine(s) are you calling about?     Cyclobenzaprine  2. QUESTION: What is your question? (e.g., double dose of medicine, side effect)     Pt inquiring about increasing the dosage as she reports it is not longer alleviating her symptoms 3. PRESCRIBER: Who prescribed the medicine? Reason: if prescribed by specialist, call should be referred to that group.     PCP 4. SYMPTOMS: Do you have any symptoms? If Yes, ask: What symptoms are you having?  How bad are the symptoms (e.g., mild, moderate, severe)     Pt notes no new or changed symptoms, notes medication does not seem to be working and is requesting and increased dosage  Answer Assessment - Initial Assessment Questions 1. TYPE of EXPOSURE: How were you exposed? 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 DATE of EXPOSURE: When did the exposure occur? (e.g., hour,  days, weeks)     Unknown 3. SYMPTOMS: Do you have any symptoms? (e.g., cough, fever, sore throat, difficulty breathing).     Body aches fever, sore throat, HA 4. HIGH RISK for COMPLICATIONS: Do you have any heart or lung problems? Do you have a weakened immune system? (e.g., CHF, COPD, asthma, HIV positive, chemotherapy, renal failure, diabetes mellitus, sickle cell anemia)     None  Answer Assessment - Initial Assessment Questions 1. SYMPTOMS: What is your main symptom or concern? (e.g., cough, fever, shortness of breath, muscle aches)     Sore throat  3. COUGH: Do you have a cough? If Yes, ask: How bad is the cough?       Yes 4. FEVER: Do you have a fever? If Yes, ask: What is your temperature, how was it measured, and when did it start?     Yes  7. OTHER SYMPTOMS: Do you have any other symptoms?  (e.g., chills, fatigue, headache, loss of smell or taste, muscle pain, sore throat)     Body aches, fever, runny nose, HA 8. INFLUENZA EXPOSURE: Was there any known exposure to influenza (flu) before the symptoms began?      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 INFLUENZA SUSPECTED: Why do you think you have influenza? (e.g., positive flu self-test at home, symptoms after exposure).     Symptoms following exposure 10. INFLUENZA VACCINE: Have you had the flu vaccine? If Yes, ask: When did you last get it?       Unknown 11. HIGH RISK FOR COMPLICATIONS: Do you have any chronic medical problems? (e.g., asthma,  heart or lung disease, obesity, weak immune system)       None  Protocols used: Medication Question Call-A-AH, Influenza (Flu) Exposure-A-AH, Influenza (Flu) Suspected-A-AH

## 2024-06-05 ENCOUNTER — Emergency Department (HOSPITAL_BASED_OUTPATIENT_CLINIC_OR_DEPARTMENT_OTHER)
Admission: EM | Admit: 2024-06-05 | Discharge: 2024-06-05 | Disposition: A | Discharge status: Home / Self Care | Attending: Emergency Medicine | Admitting: Emergency Medicine

## 2024-06-05 ENCOUNTER — Encounter (HOSPITAL_BASED_OUTPATIENT_CLINIC_OR_DEPARTMENT_OTHER): Payer: Self-pay

## 2024-06-05 ENCOUNTER — Emergency Department (HOSPITAL_BASED_OUTPATIENT_CLINIC_OR_DEPARTMENT_OTHER)

## 2024-06-05 ENCOUNTER — Other Ambulatory Visit: Payer: Self-pay

## 2024-06-05 ENCOUNTER — Ambulatory Visit: Payer: Self-pay

## 2024-06-05 ENCOUNTER — Emergency Department (HOSPITAL_BASED_OUTPATIENT_CLINIC_OR_DEPARTMENT_OTHER): Admitting: Radiology

## 2024-06-05 DIAGNOSIS — R051 Acute cough: Secondary | ICD-10-CM | POA: Diagnosis not present

## 2024-06-05 DIAGNOSIS — R110 Nausea: Secondary | ICD-10-CM | POA: Insufficient documentation

## 2024-06-05 DIAGNOSIS — Z91040 Latex allergy status: Secondary | ICD-10-CM | POA: Insufficient documentation

## 2024-06-05 DIAGNOSIS — R042 Hemoptysis: Secondary | ICD-10-CM | POA: Insufficient documentation

## 2024-06-05 LAB — CBC WITH DIFFERENTIAL/PLATELET
Abs Immature Granulocytes: 0.02 K/uL (ref 0.00–0.07)
Basophils Absolute: 0 K/uL (ref 0.0–0.1)
Basophils Relative: 1 %
Eosinophils Absolute: 0.1 K/uL (ref 0.0–0.5)
Eosinophils Relative: 1 %
HCT: 34.3 % — ABNORMAL LOW (ref 36.0–46.0)
Hemoglobin: 11.4 g/dL — ABNORMAL LOW (ref 12.0–15.0)
Immature Granulocytes: 0 %
Lymphocytes Relative: 24 %
Lymphs Abs: 2 K/uL (ref 0.7–4.0)
MCH: 30.8 pg (ref 26.0–34.0)
MCHC: 33.2 g/dL (ref 30.0–36.0)
MCV: 92.7 fL (ref 80.0–100.0)
Monocytes Absolute: 0.7 K/uL (ref 0.1–1.0)
Monocytes Relative: 9 %
Neutro Abs: 5.6 K/uL (ref 1.7–7.7)
Neutrophils Relative %: 65 %
Platelets: 381 K/uL (ref 150–400)
RBC: 3.7 MIL/uL — ABNORMAL LOW (ref 3.87–5.11)
RDW: 12.5 % (ref 11.5–15.5)
WBC: 8.5 K/uL (ref 4.0–10.5)
nRBC: 0 % (ref 0.0–0.2)

## 2024-06-05 LAB — COMPREHENSIVE METABOLIC PANEL WITH GFR
ALT: 14 U/L (ref 0–44)
AST: 16 U/L (ref 15–41)
Albumin: 4.3 g/dL (ref 3.5–5.0)
Alkaline Phosphatase: 64 U/L (ref 38–126)
Anion gap: 8 (ref 5–15)
BUN: 9 mg/dL (ref 6–20)
CO2: 29 mmol/L (ref 22–32)
Calcium: 10 mg/dL (ref 8.9–10.3)
Chloride: 105 mmol/L (ref 98–111)
Creatinine, Ser: 0.59 mg/dL (ref 0.44–1.00)
GFR, Estimated: >60 mL/min
Glucose, Bld: 92 mg/dL (ref 70–99)
Potassium: 4.4 mmol/L (ref 3.5–5.1)
Sodium: 142 mmol/L (ref 135–145)
Total Bilirubin: 0.3 mg/dL (ref 0.0–1.2)
Total Protein: 7.9 g/dL (ref 6.5–8.1)

## 2024-06-05 LAB — RESP PANEL BY RT-PCR (RSV, FLU A&B, COVID)  RVPGX2
Influenza A by PCR: NEGATIVE
Influenza B by PCR: NEGATIVE
Resp Syncytial Virus by PCR: NEGATIVE
SARS Coronavirus 2 by RT PCR: NEGATIVE

## 2024-06-05 LAB — GROUP A STREP BY PCR: Group A Strep by PCR: NOT DETECTED

## 2024-06-05 LAB — HCG, SERUM, QUALITATIVE: Preg, Serum: NEGATIVE

## 2024-06-05 LAB — TROPONIN T, HIGH SENSITIVITY: Troponin T High Sensitivity: <15 ng/L (ref 0–19)

## 2024-06-05 MED ORDER — GUAIFENESIN-CODEINE 100-10 MG/5ML PO SOLN: 5.0000 mL | Freq: Three times a day (TID) | ORAL | 0 refills | Status: AC | PRN

## 2024-06-05 MED ORDER — METHYLPREDNISOLONE 4 MG PO TBPK: ORAL_TABLET | ORAL | 0 refills | Status: AC

## 2024-06-05 MED ORDER — IOHEXOL 350 MG/ML SOLN
100.0000 mL | Freq: Once | INTRAVENOUS | Status: AC | PRN
  Administered 2024-06-05: 80 mL via INTRAVENOUS

## 2024-06-05 MED ORDER — BENZONATATE 100 MG PO CAPS: 100.0000 mg | ORAL_CAPSULE | Freq: Three times a day (TID) | ORAL | 0 refills | Status: AC

## 2024-06-05 NOTE — Discharge Instructions (Signed)
 It was a pleasure taking care of you here today  As we discussed your workup today was reassuring.  We are starting you on steroids.  There is no evidence of bacterial infection.  Have also written you for cough medicine.  Take the Tessalon  Perles during the day.  The liquid cough medicine at night.  Please use caution as this medication does contain opioid may make you sleepy.  Do not drive or operate heavy machinery while taking this medication.  May also start antihistamine at home such as Zyrtec , Claritin and use Flonase .  Make sure to follow-up outpatient, return for any worsening symptoms

## 2024-06-05 NOTE — Telephone Encounter (Signed)
 FYI Only or Action Required?: FYI only for provider: recommended UC.  Patient was last seen in primary care on 05/25/2024 by Severa Rock HERO, FNP.  Called Nurse Triage reporting Cough.  Symptoms began several days ago.  Interventions attempted: Prescription medications: Zpak and Rest, hydration, or home remedies.  Symptoms are: gradually worsening.  Triage Disposition: See HCP Within 4 Hours (Or PCP Triage)  Patient/caregiver understands and will follow disposition?: Yes    Copied from CRM #[id number]. Topic: Clinical - Red Word Triage >> Jun 05, 2024 10:13 AM Franky GRADE wrote: Red Word that prompted transfer to Nurse Triage: Patient was seen on 05/25/2024 and diagnosed with the flu and prescribed antibiotics, she is not getting any better and was coughing out blood yesterday. She states her breathing isn't normal and over feels to be getting worse.    Reason for Disposition  [1] Coughed up blood AND [2] > 1 tablespoon (15 ml)  (Exception: Blood-tinged sputum.)  Answer Assessment - Initial Assessment Questions Pt called in to reports worsening flu symptoms of productive cough and hemoptysis x 3 occurrences 12/21; approx less than 1 tsp of blood per pt. Pt reports completing tamiflu  12/15 and going to UC 12/16 d/t worsening symptoms. Pt was swabbed for COVID (-) and prescribed a Zpak. Pt states some days she feels better then symptoms come right back. Pt has been afrebrile since 12/15. Pt reports she was using her albuterol  inhaler per UC recommendation but is now needing a refill. Discussed appt with Dr. Tobie d/t lack of availability with PCP but she states she is unable to come to clinic at time of appt. Discussed potential need for chest xray and recommended evaluation needed today d/t hemoptysis. Pt voiced understanding. Pt verbalized she would go to UC today.       1. ONSET: When did the cough begin?      Ongoing; 12/16 UC   2. SEVERITY: How bad is the cough today?      Severe;  yesterday coughed up blood x3   3. SPUTUM: Describe the color of your sputum (e.g., none, dry cough; clear, white, yellow, green)     Today dark yellow/green   4. HEMOPTYSIS: Are you coughing up any blood? If Yes, ask: How much? (e.g., flecks, streaks, tablespoons, etc.)     Yes; yesterday x 3 occurrences. States that 1 tsp total   5. DIFFICULTY BREATHING: Are you having difficulty breathing? If Yes, ask: How bad is it? (e.g., mild, moderate, severe)      Denies SOB; states UC encouraged her to use inhaler but now is needing refill   6. FEVER: Do you have a fever? If Yes, ask: What is your temperature, how was it measured, and when did it start?     No; afebrile since 12/15  10. OTHER SYMPTOMS: Do you have any other symptoms? (e.g., runny nose, wheezing, chest pain)       Per UC, pt reports wheezing in her L upper lobe  Protocols used: Cough - Acute Productive-A-AH

## 2024-06-05 NOTE — ED Provider Notes (Signed)
 " [facility] Provider Note   CSN: [id number] Arrival date & time: 06/05/24  1346     Patient presents with: Influenza   Yvette Conley is a 38 y.o. female here for evaluation of hemoptysis.  Diagnosed for flu at PCP on 12/11.  Received Tamiflu  the same day, which Yvette Conley took.  A few days later started feeling worse and was seen by urgent care.  Was given Z-Pak and told to use her albuterol  inhaler.  Again started feeling better for a few days however has had a persistent cough and again is not feeling well.  No fevers since initial diagnosis.  Has had some nausea without vomiting.  Continued to have a productive yellow cough however over the last 24 hours has had 3 episodes of hemoptysis.  No history of PE or DVT.  No abdominal pain, pain or swelling to lower legs.  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   HPI     Prior to Admission medications  Medication Sig Start Date End Date Taking? Authorizing Provider  azithromycin  (ZITHROMAX ) 250 MG tablet Take by mouth. 05/30/24  Yes [provider]  benzonatate  (TESSALON ) 100 MG capsule Take 1 capsule (100 mg total) by mouth every 8 (eight) hours. 06/05/24  Yes Stephaniemarie Stoffel A, PA-C  guaiFENesin -codeine  100-10 MG/5ML syrup Take 5 mLs by mouth 3 (three) times daily as needed for cough. 06/05/24  Yes Yessenia Maillet A, PA-C  methylPREDNISolone  (MEDROL  DOSEPAK) 4 MG TBPK tablet Take as prescribed on the box 06/05/24  Yes Camree Wigington A, PA-C  albuterol  (VENTOLIN  HFA) 108 (90 Base) MCG/ACT inhaler Inhale 1-2 puffs into the lungs every 6 (six) hours as needed for wheezing or shortness of breath. 09/10/23   Ula Prentice SAUNDERS, MD  cyclobenzaprine  (FLEXERIL ) 10 MG tablet Take 1 tablet (10 mg total) by mouth 3 (three) times daily as needed for muscle spasms. 05/25/24   Antonetta Rollene BRAVO, MD  Fremanezumab -vfrm (AJOVY ) 225  MG/1.5ML SOAJ Inject 225 mg into the skin every 30 (thirty) days. 10/28/23   Whitfield Raisin, NP  meclizine  (ANTIVERT ) 12.5 MG tablet Take 1 tablet (12.5 mg total) by mouth 3 (three) times daily as needed for dizziness. 07/29/23   Antonetta Rollene BRAVO, MD  mometasone  (NASONEX ) 50 MCG/ACT nasal spray Place 2 sprays into the nose daily. 02/23/24   Aletha Bene, MD  norethindrone  (ORTHO MICRONOR ) 0.35 MG tablet Take 1 tablet (0.35 mg total) by mouth daily. 04/21/24   Antonetta Rollene BRAVO, MD  Rimegepant Sulfate (NURTEC) 75 MG TBDP Take 75 mg by mouth as needed (migraine). Patient not taking: Reported on 02/23/2024    [provider]  tirzepatide  (MOUNJARO ) 12.5 MG/0.5ML Pen Inject 12.5 mg into the skin once a week. Reports tha t has been on this medication since 2024 04/25/24   Antonetta Rollene BRAVO, MD  Vitamin D , Ergocalciferol , (DRISDOL ) 1.25 MG (50000 UNIT) CAPS capsule Take 1 capsule (50,000 Units total) by mouth every 7 (seven) days. 04/26/24   Antonetta Rollene BRAVO, MD    Allergies: Latex, Sulfa antibiotics, Topamax  [topiramate ], Wound dressing adhesive, and Augmentin  [amoxicillin -pot clavulanate]    Review of Systems  Constitutional:  Positive for activity change and appetite change.  HENT:  Positive for congestion. Negative for postnasal drip, rhinorrhea, sore throat and voice change.   Respiratory:  Positive for cough and chest tightness.   Cardiovascular:  Positive for chest pain. Negative  for palpitations and leg swelling.  Gastrointestinal: Negative.   Genitourinary: Negative.   Musculoskeletal: Negative.   Skin: Negative.   Neurological: Negative.   All other systems reviewed and are negative.   Updated Vital Signs BP 113/83   Pulse 79   Temp 99.1 F (37.3 C) (Oral)   Resp 14   LMP 05/15/2024 (Approximate)   SpO2 100%   Physical Exam Vitals and nursing note reviewed.  Constitutional:      General: Yvette Conley is not in acute distress.    Appearance: Yvette Conley is well-developed. Yvette Conley  is not ill-appearing, toxic-appearing or diaphoretic.  HENT:     Head: Normocephalic and atraumatic.     Nose: Congestion present.  Eyes:     Pupils: Pupils are equal, round, and reactive to light.  Cardiovascular:     Rate and Rhythm: Normal rate.     Pulses: Normal pulses.          Radial pulses are 2+ on the right side and 2+ on the left side.     Heart sounds: Normal heart sounds.  Pulmonary:     Effort: Pulmonary effort is normal. No respiratory distress.     Breath sounds: Normal breath sounds.  Abdominal:     General: Bowel sounds are normal. There is no distension.     Palpations: Abdomen is soft.     Tenderness: There is no abdominal tenderness.  Musculoskeletal:        General: No swelling, tenderness, deformity or signs of injury. Normal range of motion.     Cervical back: Normal range of motion.     Right lower leg: No edema.     Left lower leg: No edema.  Skin:    General: Skin is warm and dry.     Capillary Refill: Capillary refill takes less than 2 seconds.  Neurological:     General: No focal deficit present.     Mental Status: Yvette Conley is alert and oriented to person, place, and time.     Gait: Gait normal.  Psychiatric:        Mood and Affect: Mood normal.     (all labs ordered are listed, but only abnormal results are displayed) Labs Reviewed  CBC WITH DIFFERENTIAL/PLATELET - Abnormal; Notable for the following components:      Result Value   RBC 3.70 (*)    Hemoglobin 11.4 (*)    HCT 34.3 (*)    All other components within normal limits  RESP PANEL BY RT-PCR (RSV, FLU A&B, COVID)  [id number]  GROUP A STREP BY PCR  COMPREHENSIVE METABOLIC PANEL WITH GFR  HCG, SERUM, QUALITATIVE  TROPONIN T, HIGH SENSITIVITY    EKG: None  Radiology: CT Angio Chest PE W and/or Wo Contrast Result Date: 06/05/2024 CLINICAL DATA:  Chest pain.  Concern for pulmonary embolism. EXAM: CT ANGIOGRAPHY CHEST WITH CONTRAST TECHNIQUE: Multidetector CT imaging of the chest was  performed using the standard protocol during bolus administration of intravenous contrast. Multiplanar CT image reconstructions and MIPs were obtained to evaluate the vascular anatomy. RADIATION DOSE REDUCTION: This exam was performed according to the departmental dose-optimization program which includes automated exposure control, adjustment of the mA and/or kV according to patient size and/or use of iterative reconstruction technique. CONTRAST:  80mL OMNIPAQUE  IOHEXOL  350 MG/ML SOLN COMPARISON:  Chest radiograph dated 06/05/2024. FINDINGS: Cardiovascular: There is no cardiomegaly or pericardial effusion. The thoracic aorta is unremarkable. No pulmonary artery embolus identified. Mediastinum/Nodes: No hilar or mediastinal adenopathy. The esophagus is grossly  unremarkable no mediastinal fluid collection. Lungs/Pleura: No focal consolidation, pleural effusion, pneumothorax. The central airways are patent. Upper Abdomen: No acute abnormality. Musculoskeletal: No chest wall abnormality. No acute or significant osseous findings. Review of the MIP images confirms the above findings. IMPRESSION: No acute intrathoracic pathology. No CT evidence of pulmonary artery embolus. Electronically Signed   By: Vanetta Chou M.D.   On: 06/05/2024 19:54   DG Chest 2 View Result Date: 06/05/2024 CLINICAL DATA:  Upper respiratory symptoms EXAM: CHEST - 2 VIEW COMPARISON:  09/10/2023 FINDINGS: The heart size and mediastinal contours are within normal limits. Both lungs are clear. The visualized skeletal structures are unremarkable. IMPRESSION: No active cardiopulmonary disease. Electronically Signed   By: Luke Bun M.D.   On: 06/05/2024 15:37     Procedures   Medications Ordered in the ED  iohexol  (OMNIPAQUE ) 350 MG/ML injection 100 mL (80 mLs Intravenous Contrast Given 06/05/24 4743)    38 year old here for evaluation of feeling unwell.  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  Seen again by urgent care 12/15 was given azithromycin  and told to use albuterol  inhaler.  Still not feeling well.  Now having hemoptysis over the last 24 hours, 3 episodes.  Yvette Conley was concerned about pneumonia.  Here Yvette Conley is afebrile, nonseptic, non-ill-appearing.  Does have a wet cough in room.  No obvious wheeze in room.  Does not appear grossly fluid overloaded.  No pain or swelling to calves.  No history of PE or DVT.  Will add on labs, imaging.  Labs and imaging personally viewed and interpreted:  COVID, flu, RSV neg Chest x-ray without infiltrates, pneumothorax, pulmonary edema CBC without leukocytosis, hemoglobin 11.4 CMP without significant abnormality Preg neg Trop <15 Strep neg CTA wo PE, pneumonia, fluid EKG not ischemic changes  Shared decision making with patient given hemoptysis.  Discussed risk versus benefit.  Will add on CT angio to rule out occult pneumonia, pulmonary embolism given hemoptysis.  Patient reassessed.  Discussed labs and imaging.  Reassuring CT angio.  Will give start on steroids for her cough as well as cough syrup.  Will have her follow-up outpatient.  Ambulatory here with any hypoxia, tachycardia, tachypnea, fever.  Low suspicion for sepsis, acute bacterial infectious process.  The patient has been appropriately medically screened and/or stabilized in the ED. I have low suspicion for any other emergent medical condition which would require further screening, evaluation or treatment in the ED or require inpatient management.  Patient is hemodynamically stable and in no acute distress.  Patient able to ambulate in department prior to ED.  Evaluation does not show acute pathology that would require ongoing or additional emergent interventions while in the emergency department or further inpatient treatment.  I have discussed the diagnosis with the patient and answered all questions.  Pain is been managed while  in the emergency department and patient has no further complaints prior to discharge.  Patient is comfortable with plan discussed in room and is stable for discharge at this time.  I have discussed strict return precautions for returning to the emergency department.  Patient was encouraged to follow-up with PCP/specialist refer to at discharge.                                     Medical Decision Making Amount and/or Complexity of Data Reviewed  Independent Historian: spouse External Data Reviewed: labs, radiology, ECG and notes. Labs: ordered. Decision-making details documented in ED Course. Radiology: ordered and independent interpretation performed. Decision-making details documented in ED Course. ECG/medicine tests: ordered and independent interpretation performed. Decision-making details documented in ED Course.  Risk OTC drugs. Prescription drug management. Decision regarding hospitalization. Diagnosis or treatment significantly limited by social determinants of health.        Final diagnoses:  Acute cough  Hemoptysis    ED Discharge Orders          Ordered    methylPREDNISolone  (MEDROL  DOSEPAK) 4 MG TBPK tablet        06/05/24 2038    guaiFENesin -codeine  100-10 MG/5ML syrup  3 times daily PRN        06/05/24 2038    benzonatate  (TESSALON ) 100 MG capsule  Every 8 hours        06/05/24 2038               Porshia Blizzard A, PA-C 06/05/24 2121  "

## 2024-06-05 NOTE — Telephone Encounter (Signed)
 Noted and agree.

## 2024-06-05 NOTE — ED Triage Notes (Signed)
 Pt dx w flu 12/11, tamiflu  same day. Seen again 12/15 for no improvement in symptoms, given zpack. Pt reports continuation in symptoms, concern for pneumonia

## 2024-06-23 ENCOUNTER — Encounter: Payer: Self-pay | Admitting: Family Medicine

## 2024-06-23 DIAGNOSIS — R0602 Shortness of breath: Secondary | ICD-10-CM

## 2024-06-23 DIAGNOSIS — R052 Subacute cough: Secondary | ICD-10-CM

## 2024-06-23 DIAGNOSIS — R062 Wheezing: Secondary | ICD-10-CM

## 2024-06-26 ENCOUNTER — Encounter: Payer: Self-pay | Admitting: Pulmonary Disease

## 2024-06-26 ENCOUNTER — Ambulatory Visit: Admitting: Pulmonary Disease

## 2024-06-26 VITALS — BP 102/78 | HR 91 | Temp 98.1°F | Ht 67.0 in | Wt 221.4 lb

## 2024-06-26 DIAGNOSIS — R0602 Shortness of breath: Secondary | ICD-10-CM | POA: Diagnosis not present

## 2024-06-26 DIAGNOSIS — K219 Gastro-esophageal reflux disease without esophagitis: Secondary | ICD-10-CM | POA: Diagnosis not present

## 2024-06-26 DIAGNOSIS — J45998 Other asthma: Secondary | ICD-10-CM

## 2024-06-26 DIAGNOSIS — J302 Other seasonal allergic rhinitis: Secondary | ICD-10-CM | POA: Diagnosis not present

## 2024-06-26 LAB — NITRIC OXIDE: Nitric Oxide: 15

## 2024-06-26 MED ORDER — BUDESONIDE-FORMOTEROL FUMARATE 160-4.5 MCG/ACT IN AERO: 2.0000 | INHALATION_SPRAY | Freq: Two times a day (BID) | RESPIRATORY_TRACT | 11 refills | Status: AC

## 2024-06-26 MED ORDER — ALBUTEROL SULFATE HFA 108 (90 BASE) MCG/ACT IN AERS: 1.0000 | INHALATION_SPRAY | Freq: Four times a day (QID) | RESPIRATORY_TRACT | 0 refills | Status: AC | PRN

## 2024-06-26 MED ORDER — ESOMEPRAZOLE MAGNESIUM 40 MG PO CPDR: 40.0000 mg | DELAYED_RELEASE_CAPSULE | Freq: Every day | ORAL | 1 refills | Status: AC
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
## 2024-06-26 NOTE — Patient Instructions (Addendum)
 VISIT SUMMARY:  Mayrin Schmuck, you visited today due to increased shortness of breath related to your asthma. Your asthma has worsened since December, with daily inhaler use and a significant attack at work. You also reported symptoms that may be related to reflux and seasonal allergies.  YOUR PLAN:  -ASTHMA: Asthma is a condition where your airways narrow and swell, producing extra mucus, which makes it difficult to breathe. Your asthma has worsened recently, so you have been prescribed an anti-inflammatory inhaler for daily use and your albuterol  inhaler has been refilled for rescue use.  Your maintenance inhaler will be Symbicort  2 inhalations twice a day, make sure that you rinse your mouth well after you use it.  We have also refilled your albuterol  (rescue) inhaler.  You are scheduled for breathing tests in 4-6 weeks to monitor your condition.  -GASTROESOPHAGEAL REFLUX DISEASE: Gastroesophageal reflux disease (GERD) is a condition where stomach acid frequently flows back into the tube connecting your mouth and stomach, which can cause symptoms like throat clearing and cough. You will be treated empirically for reflux to see if it helps with your symptoms.  -SEASONAL ALLERGIC RHINITIS: Seasonal allergic rhinitis is an allergic reaction to pollen, dust, or other airborne substances that causes sneezing, congestion, and runny nose. Your current management with antihistamines and Flonase  will continue as it may be contributing to your respiratory symptoms.  INSTRUCTIONS:  You are scheduled for breathing tests in 4-6 weeks to monitor your asthma condition. Please continue using your prescribed medications and follow up with any new or worsening symptoms.

## 2024-07-06 ENCOUNTER — Encounter: Payer: Self-pay | Admitting: Diagnostic Neuroimaging

## 2024-07-06 ENCOUNTER — Ambulatory Visit: Admitting: Diagnostic Neuroimaging

## 2024-07-06 VITALS — BP 114/79 | HR 99 | Ht 67.0 in | Wt 220.2 lb

## 2024-07-06 DIAGNOSIS — R4689 Other symptoms and signs involving appearance and behavior: Secondary | ICD-10-CM
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
## 2024-07-06 NOTE — Patient Instructions (Signed)
" °  INTERMITTENT MUSCLE TWITCHING, SHAKING, LIGHTHEADEDNESS, VISION CHANGES, WEAKNESS (since ~age 39 years old) - try to stay active physically and get some exercise (at least 15-30 minutes per day) - eat a nutritious diet with lean protein, plants / vegetables, whole grains; avoid ultra-processed foods - increase social activities, brain stimulation, games, puzzles, hobbies, crafts, arts, music; try new activities; keep it fun! - aim for at least 7-8 hours sleep per night (or more) - avoid smoking and alcohol - try to optimize sleep hygiene "

## 2024-07-07 ENCOUNTER — Encounter: Payer: Self-pay | Admitting: Diagnostic Neuroimaging

## 2024-08-14 ENCOUNTER — Encounter

## 2024-08-14 ENCOUNTER — Ambulatory Visit: Admitting: Pulmonary Disease

## 2024-08-22 ENCOUNTER — Ambulatory Visit: Admitting: Family Medicine

## 2024-11-01 ENCOUNTER — Telehealth: Admitting: Adult Health
